# Patient Record
Sex: Male | Born: 1935
Health system: Southern US, Community
[De-identification: ages and names within clinical notes are randomized; demographics above are authoritative.]

## PROBLEM LIST (undated history)

## (undated) DIAGNOSIS — F32A Depression, unspecified: Secondary | ICD-10-CM

## (undated) DIAGNOSIS — I10 Essential (primary) hypertension: Secondary | ICD-10-CM

## (undated) DIAGNOSIS — K4090 Unilateral inguinal hernia, without obstruction or gangrene, not specified as recurrent: Secondary | ICD-10-CM

## (undated) DIAGNOSIS — C801 Malignant (primary) neoplasm, unspecified: Secondary | ICD-10-CM

## (undated) HISTORY — PX: EYE SURGERY: SHX253

## (undated) HISTORY — PX: NASAL SEPTUM SURGERY: SHX37

## (undated) HISTORY — PX: CATARACT EXTRACTION: SUR2

---

## 2016-01-20 ENCOUNTER — Emergency Department (HOSPITAL_COMMUNITY)
Admission: EM | Admit: 2016-01-20 | Discharge: 2016-01-20 | Disposition: A | Discharge status: Home / Self Care | Payer: Medicare Other | Attending: Emergency Medicine | Admitting: Emergency Medicine

## 2016-01-20 ENCOUNTER — Encounter (HOSPITAL_COMMUNITY): Payer: Self-pay

## 2016-01-20 DIAGNOSIS — L989 Disorder of the skin and subcutaneous tissue, unspecified: Secondary | ICD-10-CM

## 2016-01-20 DIAGNOSIS — I10 Essential (primary) hypertension: Secondary | ICD-10-CM | POA: Diagnosis not present

## 2016-01-20 DIAGNOSIS — K4090 Unilateral inguinal hernia, without obstruction or gangrene, not specified as recurrent: Secondary | ICD-10-CM | POA: Diagnosis not present

## 2016-01-20 MED ORDER — BACITRACIN ZINC 500 UNIT/GM EX OINT
TOPICAL_OINTMENT | Freq: Once | CUTANEOUS | Status: AC
  Administered 2016-01-20: 1 via TOPICAL

## 2016-01-20 NOTE — ED Triage Notes (Signed)
Pt states that he has has a hernia on his groin area since 2013 that he normally reduces himself, today he was unable to reduce it. Pt has never seen anyone for it.

## 2016-01-20 NOTE — ED Provider Notes (Signed)
TIME SEEN: 4:30 AM  CHIEF COMPLAINT: Evaluation of a right inguinal hernia  HPI: Pt is a 82 y.o. male with no significant past medical history who presents to the emergency department for evaluation of his right inguinal hernia. Reports his hernia has been present for the past 25 years. States that tonight he was unable to reduce his hernia and has had mild tenderness over this area. No skin changes. No fevers. No nausea or vomiting. Has been having normal bowel movements and passing gas. Denies history of abdominal surgery. No dysuria or hematuria. No testicular pain or swelling. No scrotal masses.   Patient is also hypertensive. He reports that he has whitecoat syndrome. He denies headache, vision changes, numbness or focal weakness, chest pain or shortness of breath. States his blood pressure is normally in the 130s/70s.   He is also complaining of a lesion to his upper back that has been present for 3 years. It bleeds intermittently. No drainage of anything that appears purulent. No fevers. It is not painful.   He states he has not followed up with a primary care physician in several years. His PCP is Dr. Arelia Sneddon. He has never seen a dermatologist for his back lesion is concerned that this could be cancer. Denies night sweats, weight loss. No history of skin cancer that he is aware of.  ROS: See HPI Constitutional: no fever  Eyes: no drainage  ENT: no runny nose   Cardiovascular:  no chest pain  Resp: no SOB  GI: no vomiting GU: no dysuria Integumentary: no rash  Allergy: no hives  Musculoskeletal: no leg swelling  Neurological: no slurred speech ROS otherwise negative  PAST MEDICAL HISTORY/PAST SURGICAL HISTORY:  History reviewed. No pertinent past medical history.  MEDICATIONS:  Prior to Admission medications   Not on File    ALLERGIES:  No Known Allergies  SOCIAL HISTORY:  Social History  Substance Use Topics  . Smoking status: Never Smoker  . Smokeless tobacco:  Never Used  . Alcohol use No    FAMILY HISTORY: No family history on file.  EXAM: BP 155/83 (BP Location: Right Arm)   Pulse (!) 58   Temp 98 F (36.7 C) (Oral)   Resp 17   Ht 5\' 10"  (1.778 m)   Wt 175 lb (79.4 kg)   SpO2 99%   BMI 25.11 kg/m  CONSTITUTIONAL: Alert and oriented and responds appropriately to questions. Well-appearing; well-nourished, Elderly, afebrile, nontoxic-appearing HEAD: Normocephalic EYES: Conjunctivae clear, PERRL, EOMI ENT: normal nose; no rhinorrhea; moist mucous membranes NECK: Supple, no meningismus, no nuchal rigidity, no LAD  CARD: RRR; S1 and S2 appreciated; no murmurs, no clicks, no rubs, no gallops RESP: Normal chest excursion without splinting or tachypnea; breath sounds clear and equal bilaterally; no wheezes, no rhonchi, no rales, no hypoxia or respiratory distress, speaking full sentences ABD/GI: Normal bowel sounds; non-distended; soft, non-tender, no rebound, no guarding, no peritoneal signs, no hepatosplenomegaly GU:  Normal external genitalia, circumcised male, normal penile shaft, no blood or discharge at the urethral meatus, no testicular masses or tenderness on exam, no scrotal masses or swelling, right inguinal hernia that is easily reducible on exam and is only minimally tender to palpation without erythema, warmth or ecchymosis of the scan, 2+ femoral pulses bilaterally; no perineal erythema, warmth, subcutaneous air or crepitus; no high riding testicle, normal bilateral cremasteric reflex.  Chaperone present for exam. BACK:  The back appears normal and is non-tender to palpation, there is no CVA tenderness EXT: Normal  ROM in all joints; non-tender to palpation; no edema; normal capillary refill; no cyanosis, no calf tenderness or swelling    SKIN: Normal color for age and race; warm; no rash; 7 x 10 cm area of skin breakdown to the mid thoracic region with some mild bleeding but no stranding erythema, warmth or purulent drainage. There is  no induration. NEURO: Moves all extremities equally, sensation to light touch intact diffusely, cranial nerves II through XII intact, normal speech PSYCH: The patient's mood and manner are appropriate. Grooming and personal hygiene are appropriate.  MEDICAL DECISION MAKING: Patient here with inguinal hernia that was easily reducible on exam. Otherwise abdominal exam benign and he has no fevers, nausea, vomiting, diarrhea. No sign of bowel obstruction. This is not an incarcerated hernia. We'll give him outpatient surgery follow-up as needed. He has had this hernia for 25 years. Have discussed with him how to reduce it at home.   Patient also hypertensive but is asymptomatic. He states he has whitecoat syndrome. Reports normally he has normal blood pressures. I have advised him to take his blood pressure at home and keep a log of it when he is feeling well and follow-up with his primary care physician closely to see if he should be started on antihypertensives. Discussed with him return precautions regarding this. He is comfortable with this plan.    Patient also with a large lesion to his back. No sign of superimposed infection. This could be a cancerous lesion area have recommended close outpatient dermatology follow-up for biopsy. Have given him a list of dermatologists in this area. He has no other systemic symptoms. We will clean, apply dressing to this area.   I have also discussed my recommendations with patient's son at bedside. They both verbalize understanding and are comfortable with this plan. Return precautions have been discussed as well.   At this time, I do not feel there is any life-threatening condition present. I have reviewed and discussed all results (EKG, imaging, lab, urine as appropriate) and exam findings with patient/family. I have reviewed nursing notes and appropriate previous records.  I feel the patient is safe to be discharged home without further emergent workup and can  continue workup as an outpatient as needed. Discussed usual and customary return precautions. Patient/family verbalize understanding and are comfortable with this plan.  Outpatient follow-up has been provided. All questions have been answered.        Keeler Farm, DO 01/20/16 [phone]

## 2016-01-20 NOTE — Discharge Instructions (Signed)
I recommend close follow-up with your primary care physician to monitor your blood pressure closely. If you develop severe headache, vision changes, chest pain or shortness of breath, numbness or weakness on one side of your body, please return to the hospital.   I also recommend close follow-up with a dermatologist for the lesion on your back. It does not appear infected today. This may be a cancerous lesion and you will need a biopsy as an outpatient. If it becomes red, hot, very tender to touch or you develop fever or drainage of pus from this area, you will need to be seen closely by your PCP or the emergency department.   You're also seen inferior inguinal hernia. If this begins to be an issue for you where it is constantly bothering you, inhibiting her life, you can follow-up with general surgery to discuss risk and benefits of surgical treatment.   To find a primary care or specialty doctor please call [phone] or [phone] to access "[place] a Doctor Service."  You may also go on the [facility] website at [email or web address]  There are also multiple [facility], [facility], [facility] and [facility] practices throughout the [place] that are frequently accepting new patients. You may find a clinic that is close to your home and contact them.  [place] [phone] [place] Department -  [place] [address] [phone]   [place] [facility] Dermatologists:   [facility]  3.2 [phone]  Dermatologist  [place] [address] [place]  [phone]   Dr. Michelene Gardener, MD  2.6 (18)  Dermatologist  [place]  [phone]  [facility]  3.5 (3)  [facility]  [place]  [phone]    [place]  4.0 (4)  Dermatologist  [place]  [phone]  Lavonna Monarch MD  3.0 (2)  Dermatologist  [place]  [phone]  Katrina Stack  2.7 (6)  Dermatologist  [place]  [phone]  [place] Amy Y MD  2.0 (1)  Dermatologist  [place]  [phone]  [place]  5.0 (3)  Doctor  [address]  [phone]

## 2017-04-22 NOTE — Progress Notes (Signed)
[facility] Note  04/25/2017     CHIEF COMPLAINT Patient presents for Retina Evaluation   HISTORY OF PRESENT ILLNESS: Donald Hawkins is a 84 y.o. male who presents to the clinic today for:   HPI    Retina Evaluation    In both eyes.  This started 5 months ago.  Associated Symptoms Distortion and Floaters.  Negative for Flashes, Pain, Trauma, Fever, Weight Loss, Scalp Tenderness, Redness, Photophobia, Jaw Claudication, Fatigue, Shoulder/Hip pain, Glare and Blind Spot.  Context:  distance vision, mid-range vision, near vision and reading.  Treatments tried include surgery.  Response to treatment was mild improvement.  I, the attending physician,  performed the HPI with the patient and updated documentation appropriately.          Comments    Referral of Dr. Katy Fitch for retina evaluation CME OS poss AMD. Patient states he had  cataract surgery left eye ( 12/11/16), his vision was good. After 2-3 weeks he felts as if, he had a filter covering his left eye, his vision changes from day to day. ( Skimmer covering his eye) Pt reports his vision has always been bad in right eye, he can only see forms of things .Pt reports he occasionally has wavy Va OS .Pt is using Lumigan, Cosopt, Ilevro gtts. Denies vits       Last edited by Bernarda Caffey, MD on 04/25/2017  2:07 PM. (History)    Pt states he was sent here by Dr. Elliot Dally; Pt states he sees Dr. Elliot Dally on a routine basis; Pt states he saw Dr. Elliot Dally a month ago due to "not being able to see out of left eye"; Pt states his VA was 20/30 then decreased to 20/40; Pt states OS VA fluctuates; Pt reports he was placed on Ilevro OS qd x 2 month; Pt reports he also uses lumigan OU qhs, and dorz-tim OU BID; Pt states he has "wavy" VA; Pt states OD VA is poor due to glaucoma, states in 2015 he was dx with glaucoma; Pt denies any family hx of glaucoma; Pt states he is unable to see any improvement since using Ilevro;   Referring  physician: Clent Jacks, MD [place] [address] [place], [place] [address]  HISTORICAL INFORMATION:   Selected notes from the MEDICAL RECORD NUMBER Referred by Dr. Elliot Dally for concern of possible exudative AMD OS;  LEE- 02.12.19 (R. Groat) [BCVA OD: 20/50 OS: 20/40+2] Ocular Hx- pseudophakia OU (OD 07.20.15 - R. Groat, OS 10.24.18 - R. Groat) , S/P Yag PI OU (03.2015 - R. Groat), S/P SLT OD (04.2016 - R. Groat), narrow angles OU, POAG OU, DES OU, CME OS, currently on lumigan OU qhs, dorzolamide-timolol OU BID, and Ilevero OS qd PMH- HTN, hx removal of skin ca    CURRENT MEDICATIONS: Current Outpatient Medications (Ophthalmic Drugs)  Medication Sig  . bimatoprost (LUMIGAN) 0.03 % ophthalmic solution 1 drop at bedtime.  . dorzolamide-timolol (COSOPT) 22.3-6.8 MG/ML ophthalmic solution Place 1 drop into both eyes 2 (two) times daily.  . nepafenac (ILEVRO) 0.3 % ophthalmic suspension Place 1 drop into the left eye 2 (two) times daily.  . prednisoLONE acetate (PRED FORTE) 1 % ophthalmic suspension Place 1 drop into the left eye 4 (four) times daily.   No current facility-administered medications for this visit.  (Ophthalmic Drugs)   No current outpatient medications on file. (Other)   No current facility-administered medications for this visit.  (Other)  REVIEW OF SYSTEMS: ROS    Positive for: Genitourinary, Eyes   Negative for: Constitutional, Gastrointestinal, Neurological, Skin, Musculoskeletal, HENT, Endocrine, Cardiovascular, Respiratory, Psychiatric, Allergic/Imm, Heme/Lymph   Last edited by Zenovia Jordan, LPN on 3/81/8299  3:71 PM. (History)       ALLERGIES No Known Allergies  PAST MEDICAL HISTORY History reviewed. No pertinent past medical history. Past Surgical History:  Procedure Laterality Date  . CATARACT EXTRACTION    . EYE SURGERY    . NASAL SEPTUM SURGERY    . NASAL SEPTUM SURGERY     40 yrs ago    FAMILY HISTORY Family History  Problem Relation Age of  Onset  . Emphysema Mother   . Cancer Father     SOCIAL HISTORY Social History   Tobacco Use  . Smoking status: Never Smoker  . Smokeless tobacco: Never Used  Substance Use Topics  . Alcohol use: Yes    Alcohol/week: 0.6 oz    Types: 1 Glasses of wine per week  . Drug use: No         OPHTHALMIC EXAM:  Base Eye Exam    Visual Acuity (Snellen - Linear)      Right Left   Dist [place] 20/70 -2 20/40 +1   Dist ph [place] 20/60 -1 20/30 -1       Tonometry (Tonopen, 1:45 PM)      Right Left   Pressure 11 11       Pupils      Dark Light Shape React APD   Right 2 1 Round Slow None   Left 2 1 Round Slow None       Visual Fields (Counting fingers)      Left Right    Full    Restrictions  Total superior temporal, inferior temporal, inferior nasal deficiencies; Partial outer superior nasal deficiency       Extraocular Movement      Right Left    Full, Ortho Full, Ortho       Neuro/Psych    Oriented x3:  Yes   Mood/Affect:  Normal       Dilation    Both eyes:  1.0% Mydriacyl, 2.5% Phenylephrine @ 1:52 PM        Slit Lamp and Fundus Exam    Slit Lamp Exam      Right Left   Lids/Lashes Meibomian gland dysfunction, Telangiectasia Meibomian gland dysfunction, Telangiectasia, Scurf   Conjunctiva/Sclera White and quiet White and quiet   Cornea Arcus Arcus   Anterior Chamber Deep and quiet Deep and 0.5+ cell/pigment   Iris Round and moderatly dilated to 47mm, temporal PI, Transillumination defects at 0800 Round and moderatly dilated to 40mm, Transillumination defects nasally and temporally   Lens PC IOL in good position, trace Posterior capsular opacification PC IOL in good position   Vitreous Vitreous syneresis, Posterior vitreous detachment Vitreous syneresis, Posterior vitreous detachment       Fundus Exam      Right Left   Disc 3-4+ Pallor, cupping Temporal Pallor   C/D Ratio 0.9 0.4   Macula Blunted foveal reflex, + central SRF, pigment mottling and clumping Blunted  foveal reflex, +SRF, +CME, no heme   Vessels Vascular attenuation, AV crossing changes Vascular attenuation, AV crossing changes   Periphery Attached Attached        Refraction    Manifest Refraction      Sphere Cylinder Axis Dist VA   Right -0.75 +0.50 020 NI   Left -0.50 +0.50 015  NI          IMAGING AND PROCEDURES  Imaging and Procedures for 04/25/17  OCT, Retina - OU - Both Eyes     Right Eye Quality was good. Central Foveal Thickness: 276. Progression has no prior data. Findings include abnormal foveal contour, subretinal fluid, retinal drusen , no IRF (Central vitelliform-like lesion).   Left Eye Quality was good. Central Foveal Thickness: 441. Progression has no prior data. Findings include abnormal foveal contour, intraretinal fluid, subretinal fluid, retinal drusen , outer retinal atrophy (Central vitelliform-like lesion with overlying CME).   Notes *Images captured and stored on drive  Diagnosis / Impression:  OD: small central vitelliform like lesion OS: small central vitelliform like lesion; +CME  Clinical management:  See below  Abbreviations: NFP - Normal foveal profile. CME - cystoid macular edema. PED - pigment epithelial detachment. IRF - intraretinal fluid. SRF - subretinal fluid. EZ - ellipsoid zone. ERM - epiretinal membrane. ORA - outer retinal atrophy. ORT - outer retinal tubulation. SRHM - subretinal hyper-reflective material         Fluorescein Angiography Optos (Transit OS)     Right Eye Progression has no prior data. Early phase findings include normal observations. Mid/Late phase findings include normal observations. Choroidal neovascularization is not present.   Left Eye Progression has no prior data. Early phase findings include normal observations. Mid/Late phase findings include leakage (petaloid hyper-fluoresceince about fovea, hyper fluorescence of disc).   Notes Impression:  OD: normal study OS: late petaloid hyper-fluoresceince  about fovea, hyper fluorescence of disc, consistent with CME                ASSESSMENT/PLAN:    ICD-10-CM   1. CME (cystoid macular edema), left H35.352 Fluorescein Angiography Optos (Transit OS)  2. Retinal edema H35.81 OCT, Retina - OU - Both Eyes    Fluorescein Angiography Optos (Transit OS)  3. Vitelliform lesion of macula H35.54   4. Posterior vitreous detachment of both eyes H43.813   5. Primary open angle glaucoma (POAG) of right eye, severe stage H40.1113   6. Primary open angle glaucoma (POAG) of left eye, moderate stage H40.1122   7. Pseudophakia of both eyes Z96.1     1,2. Cystoid macular edema, OS-  - OCT with +cystoid IRF OS - FA shows petaloid leakage about the fovea w/ hyperfluorescence of the disc consistent w/ post op CME - s/p phaco/PCIOL 12/2016 -- suspect rebound / postoperative CME OS - differential could include prostaglandin use (lumigan) - discussed findings, prognosis and treatment options - recommend increasing Ilevro to BID OS and starting PF QID OS - f/u in 2-3 wks for recheck  3. Vitelliform-like Dystrophy OU-  - mild central vitelliform like lesions OU - FA without obvious underlying CNVM - will monitor for now and reassess once #1 improved  4. PVD / vitreous syneresis OU-  Discussed findings and prognosis  No RT or RD on 360 peripheral exam  Reviewed s/s of RT/RD  Strict return precautions for any such RT/RD signs/symptoms  5, 6. Primary Open Angle Glaucoma OU-  - severe stage OD, moderate stage OS - s/p SLT and YAG PI OU - continue lumigan OU qd and dorzolamide-timolol OU BID - ?contribution of lumigan to CME -- will continue for now, but may be worth considering trial off lumigan - expertly managed by Dr. Elliot Dally - monitor  7. Pseudophakia OU  - s/p CE/IOL OU  - beautiful surgery, doing well  - monitor   Ophthalmic Meds  Ordered this visit:  Meds ordered this encounter  Medications  . prednisoLONE acetate (PRED FORTE) 1 %  ophthalmic suspension    Sig: Place 1 drop into the left eye 4 (four) times daily.    Dispense:  10 mL    Refill:  0  . nepafenac (ILEVRO) 0.3 % ophthalmic suspension    Sig: Place 1 drop into the left eye 2 (two) times daily.    Dispense:  3 mL    Refill:  2       Return for 2-3 wks, Dilated Exam, OCT.  There are no Patient Instructions on file for this visit.   Explained the diagnoses, plan, and follow up with the patient and they expressed understanding.  Patient expressed understanding of the importance of proper follow up care.   This document serves as a record of services personally performed by Gardiner Sleeper, MD, PhD. It was created on their behalf by Catha Brow, [place], a certified ophthalmic assistant. The creation of this record is the provider's dictation and/or activities during the visit.  Electronically signed by: Catha Brow, [place]  04/25/17 3:33 PM    Gardiner Sleeper, M.D., Ph.D. Diseases & Surgery of the Retina and Vitreous [facility] [place] 04/25/17   I have reviewed the above documentation for accuracy and completeness, and I agree with the above. Gardiner Sleeper, M.D., Ph.D. 04/25/17 3:33 PM    Abbreviations: M myopia (nearsighted); A astigmatism; H hyperopia (farsighted); P presbyopia; Mrx spectacle prescription;  CTL contact lenses; OD right eye; OS left eye; OU both eyes  XT exotropia; ET esotropia; PEK punctate epithelial keratitis; PEE punctate epithelial erosions; DES dry eye syndrome; MGD meibomian gland dysfunction; ATs artificial tears; PFAT's preservative free artificial tears; [place] nuclear sclerotic cataract; PSC posterior subcapsular cataract; ERM epi-retinal membrane; PVD posterior vitreous detachment; RD retinal detachment; DM diabetes mellitus; DR diabetic retinopathy; NPDR non-proliferative diabetic retinopathy; PDR proliferative diabetic retinopathy; CSME clinically significant macular edema; DME diabetic macular edema;  dbh dot blot hemorrhages; CWS cotton wool spot; POAG primary open angle glaucoma; C/D cup-to-disc ratio; HVF humphrey visual field; GVF goldmann visual field; OCT optical coherence tomography; IOP intraocular pressure; BRVO Branch retinal vein occlusion; CRVO central retinal vein occlusion; CRAO central retinal artery occlusion; BRAO branch retinal artery occlusion; RT retinal tear; SB scleral buckle; PPV pars plana vitrectomy; VH Vitreous hemorrhage; PRP panretinal laser photocoagulation; IVK intravitreal kenalog; VMT vitreomacular traction; MH Macular hole;  NVD neovascularization of the disc; NVE neovascularization elsewhere; AREDS age related eye disease study; ARMD age related macular degeneration; POAG primary open angle glaucoma; EBMD epithelial/anterior basement membrane dystrophy; ACIOL anterior chamber intraocular lens; IOL intraocular lens; PCIOL posterior chamber intraocular lens; Phaco/IOL phacoemulsification with intraocular lens placement; [place] photorefractive keratectomy; LASIK laser assisted in situ keratomileusis; HTN hypertension; DM diabetes mellitus; COPD chronic obstructive pulmonary disease

## 2017-04-25 ENCOUNTER — Ambulatory Visit (INDEPENDENT_AMBULATORY_CARE_PROVIDER_SITE_OTHER): Payer: Medicare Other | Admitting: Ophthalmology

## 2017-04-25 ENCOUNTER — Encounter (INDEPENDENT_AMBULATORY_CARE_PROVIDER_SITE_OTHER): Payer: Self-pay | Admitting: Ophthalmology

## 2017-04-25 DIAGNOSIS — H401113 Primary open-angle glaucoma, right eye, severe stage: Secondary | ICD-10-CM

## 2017-04-25 DIAGNOSIS — H401122 Primary open-angle glaucoma, left eye, moderate stage: Secondary | ICD-10-CM

## 2017-04-25 DIAGNOSIS — Z961 Presence of intraocular lens: Secondary | ICD-10-CM

## 2017-04-25 DIAGNOSIS — H43813 Vitreous degeneration, bilateral: Secondary | ICD-10-CM | POA: Diagnosis not present

## 2017-04-25 DIAGNOSIS — H35352 Cystoid macular degeneration, left eye: Secondary | ICD-10-CM | POA: Diagnosis not present

## 2017-04-25 DIAGNOSIS — H3554 Dystrophies primarily involving the retinal pigment epithelium: Secondary | ICD-10-CM | POA: Diagnosis not present

## 2017-04-25 DIAGNOSIS — H3581 Retinal edema: Secondary | ICD-10-CM | POA: Diagnosis not present

## 2017-04-25 MED ORDER — NEPAFENAC 0.3 % OP SUSP: 1.0000 [drp] | Freq: Two times a day (BID) | OPHTHALMIC | 2 refills | Status: DC

## 2017-04-25 MED ORDER — PREDNISOLONE ACETATE 1 % OP SUSP: 1.0000 [drp] | Freq: Four times a day (QID) | OPHTHALMIC | 0 refills | Status: DC

## 2017-05-11 NOTE — Progress Notes (Signed)
[facility] Note  05/12/2017     CHIEF COMPLAINT Patient presents for Retina Follow Up   HISTORY OF PRESENT ILLNESS: Donald Hawkins is a 84 y.o. male who presents to the clinic today for:   HPI    Retina Follow Up    Patient presents with  Other.  In left eye.  Severity is moderate.  Duration of 3 weeks.  Since onset it is stable.  I, the attending physician,  performed the HPI with the patient and updated documentation appropriately.          Comments    F/U CME OS; Pt states OU VA "comes and goes"; Pt states he is seeing "colors more vivid than before"; Pt states he is able to make out more detail than before; Pt states OU are comfortable; Pt reports he continues to have floaters, pt reports also seeing "a net but I don't notice as much as I did"; Pt reports he continues to have a wave in [place], states he notices the wavyness more so when watching TV; Pt denies ocular pain, denies flashes; Pt reports using Ilevero OS BID, PF OS QID, lumigan OU qd, and dorz-tim OU BID as directed;        Last edited by Bernarda Caffey, MD on 05/12/2017 10:31 AM. (History)       Referring physician: Leonard Downing, MD [place], [place] [address]  HISTORICAL INFORMATION:   Selected notes from the MEDICAL RECORD NUMBER Referred by Dr. Elliot Dally for concern of possible exudative AMD OS;  LEE- 02.12.19 (R. Groat) [BCVA OD: 20/50 OS: 20/40+2] Ocular Hx- pseudophakia OU (OD 07.20.15 - R. Groat, OS 10.24.18 - R. Groat) , S/P Yag PI OU (03.2015 - R. Groat), S/P SLT OD (04.2016 - R. Groat), narrow angles OU, POAG OU, DES OU, CME OS, currently on lumigan OU qhs, dorzolamide-timolol OU BID, and Ilevero OS qd PMH- HTN, hx removal of skin ca    CURRENT MEDICATIONS: Current Outpatient Medications (Ophthalmic Drugs)  Medication Sig  . bimatoprost (LUMIGAN) 0.03 % ophthalmic solution 1 drop at bedtime.  . brimonidine (ALPHAGAN) 0.15 % ophthalmic solution Place 1  drop into both eyes 2 (two) times daily.  . dorzolamide-timolol (COSOPT) 22.3-6.8 MG/ML ophthalmic solution Place 1 drop into both eyes 2 (two) times daily.  . nepafenac (ILEVRO) 0.3 % ophthalmic suspension Place 1 drop into the left eye 2 (two) times daily.  . prednisoLONE acetate (PRED FORTE) 1 % ophthalmic suspension Place 1 drop into the left eye 4 (four) times daily.   No current facility-administered medications for this visit.  (Ophthalmic Drugs)   No current outpatient medications on file. (Other)   No current facility-administered medications for this visit.  (Other)      REVIEW OF SYSTEMS: ROS    Positive for: Eyes   Negative for: Constitutional, Gastrointestinal, Neurological, Skin, Genitourinary, Musculoskeletal, HENT, Endocrine, Cardiovascular, Respiratory, Psychiatric, Allergic/Imm, Heme/Lymph   Last edited by Cherrie Gauze on 05/12/2017  9:47 AM. (History)       ALLERGIES No Known Allergies  PAST MEDICAL HISTORY History reviewed. No pertinent past medical history. Past Surgical History:  Procedure Laterality Date  . CATARACT EXTRACTION    . EYE SURGERY    . NASAL SEPTUM SURGERY    . NASAL SEPTUM SURGERY     40 yrs ago    FAMILY HISTORY Family History  Problem Relation Age of Onset  . Emphysema Mother   . Cancer  Father     SOCIAL HISTORY Social History   Tobacco Use  . Smoking status: Never Smoker  . Smokeless tobacco: Never Used  Substance Use Topics  . Alcohol use: Yes    Alcohol/week: 0.6 oz    Types: 1 Glasses of wine per week  . Drug use: No         OPHTHALMIC EXAM:  Base Eye Exam    Visual Acuity (Snellen - Linear)      Right Left   Dist [place] 20/60 -2 20/40   Dist ph [place] 20/50 -2 20/30       Tonometry (Tonopen, 9:56 AM)      Right Left   Pressure 12 16       Pupils      Dark Light Shape React APD   Right 3 3 Round Sluggish Trace   Left 3 2 Round Slow None       Visual Fields (Counting fingers)      Left Right     Full    Restrictions  Total superior temporal, inferior temporal, inferior nasal deficiencies; Partial outer superior nasal deficiency       Extraocular Movement      Right Left    Full, Ortho Full, Ortho       Neuro/Psych    Oriented x3:  Yes   Mood/Affect:  Normal       Dilation    Both eyes:  1.0% Mydriacyl, 2.5% Phenylephrine @ 9:56 AM        Slit Lamp and Fundus Exam    Slit Lamp Exam      Right Left   Lids/Lashes Meibomian gland dysfunction, Telangiectasia Meibomian gland dysfunction, Telangiectasia, Scurf   Conjunctiva/Sclera White and quiet White and quiet   Cornea Arcus Arcus   Anterior Chamber Deep and quiet Deep and 0.5+ cell/pigment   Iris Round and moderatly dilated to 60mm, temporal PI, Transillumination defects at 0800 Round and moderatly dilated to 36mm, Transillumination defects nasally and temporally   Lens PC IOL in good position, trace Posterior capsular opacification PC IOL in good position   Vitreous Vitreous syneresis, Posterior vitreous detachment Vitreous syneresis, Posterior vitreous detachment       Fundus Exam      Right Left   Disc 3-4+ Pallor, cupping Temporal Pallor   C/D Ratio 0.9 0.4   Macula Blunted foveal reflex, +central SRF/vitelliform lesion, pigment mottling and clumping Blunted foveal reflex, +SRF/vitelliform lesion, +CME / cystic changes, no heme   Vessels Vascular attenuation, AV crossing changes Vascular attenuation, AV crossing changes   Periphery Attached Attached          IMAGING AND PROCEDURES  Imaging and Procedures for 05/12/17  OCT, Retina - OU - Both Eyes     Right Eye Quality was good. Central Foveal Thickness: 273. Progression has been stable. Findings include abnormal foveal contour, subretinal fluid, retinal drusen , no IRF (Central vitelliform-like lesion).   Left Eye Quality was good. Central Foveal Thickness: 434. Progression has improved. Findings include abnormal foveal contour, intraretinal fluid,  subretinal fluid, retinal drusen , outer retinal atrophy (Central vitelliform-like lesion with overlying CME).   Notes *Images captured and stored on drive  Diagnosis / Impression:  OD: small central vitelliform like lesion OS: small central vitelliform like lesion; +CME - slightly improved  Clinical management:  See below  Abbreviations: NFP - Normal foveal profile. CME - cystoid macular edema. PED - pigment epithelial detachment. IRF - intraretinal fluid. SRF - subretinal fluid. EZ -  ellipsoid zone. ERM - epiretinal membrane. ORA - outer retinal atrophy. ORT - outer retinal tubulation. SRHM - subretinal hyper-reflective material                  ASSESSMENT/PLAN:    ICD-10-CM   1. CME (cystoid macular edema), left H35.352   2. Retinal edema H35.81 OCT, Retina - OU - Both Eyes  3. Vitelliform lesion of macula H35.54   4. Posterior vitreous detachment of both eyes H43.813   5. Primary open angle glaucoma (POAG) of right eye, severe stage H40.1113   6. Primary open angle glaucoma (POAG) of left eye, moderate stage H40.1122   7. Pseudophakia of both eyes Z96.1     1,2. Cystoid macular edema, OS-  - OCT with +cystoid IRF OS - FA at presentation (2.18.19) showed petaloid leakage about the fovea w/ hyperfluorescence of the disc consistent w/ post op CME - s/p phaco/PCIOL 12/2016 -- suspect rebound / postoperative CME OS - differential could include prostaglandin use (lumigan) - mild interval improvement on topical Ilevro and PF - discussed findings, prognosis and treatment options - continue Ilevro to BID OS and PF QID OS - recommend replacement of lumigan with brimonidine qhs for now - f/u in 2-3 weeks  3. Vitelliform-like Dystrophy OU-  - mild central vitelliform like lesions OU - FA without obvious underlying CNVM - will monitor for now and reassess once #1 improved  4. PVD / vitreous syneresis OU-  Discussed findings and prognosis  No RT or RD on 360 peripheral  exam  Reviewed s/s of RT/RD  Strict return precautions for any such RT/RD signs/symptoms  5, 6. Primary Open Angle Glaucoma OU-  - severe stage OD, moderate stage OS - expertly managed by Dr. Elliot Dally - s/p SLT and YAG PI OU - continue dorzolamide-timolol OU BID - recommend D/C lumigan and switch to brimonidine OU qhs due to association of prostaglandin analogs to CME - will recheck IOP at next follow up in 3 wks - will send letter to Dr. Katy Fitch to update - monitor  7. Pseudophakia OU  - s/p CE/IOL OU  - beautiful surgery, doing well  - monitor   Ophthalmic Meds Ordered this visit:  Meds ordered this encounter  Medications  . brimonidine (ALPHAGAN) 0.15 % ophthalmic solution    Sig: Place 1 drop into both eyes 2 (two) times daily.    Dispense:  10 mL    Refill:  1       Return in about 3 weeks (around 06/02/2017) for F/U CME OS.  There are no Patient Instructions on file for this visit.   Explained the diagnoses, plan, and follow up with the patient and they expressed understanding.  Patient expressed understanding of the importance of proper follow up care.   This document serves as a record of services personally performed by Gardiner Sleeper, MD, PhD. It was created on their behalf by Catha Brow, [place], a certified ophthalmic assistant. The creation of this record is the provider's dictation and/or activities during the visit.  Electronically signed by: Catha Brow, [place]  05/12/17 11:17 PM   Gardiner Sleeper, M.D., Ph.D. Diseases & Surgery of the Retina and [place] 05/12/17   I have reviewed the above documentation for accuracy and completeness, and I agree with the above. Gardiner Sleeper, M.D., Ph.D. 05/12/17 11:22 PM     Abbreviations: M myopia (nearsighted); A astigmatism; H hyperopia (farsighted); P presbyopia; Mrx spectacle prescription;  CTL contact lenses; OD right eye; OS left eye; OU both eyes  XT exotropia; ET  esotropia; PEK punctate epithelial keratitis; PEE punctate epithelial erosions; DES dry eye syndrome; MGD meibomian gland dysfunction; ATs artificial tears; PFAT's preservative free artificial tears; [place] nuclear sclerotic cataract; PSC posterior subcapsular cataract; ERM epi-retinal membrane; PVD posterior vitreous detachment; RD retinal detachment; DM diabetes mellitus; DR diabetic retinopathy; NPDR non-proliferative diabetic retinopathy; PDR proliferative diabetic retinopathy; CSME clinically significant macular edema; DME diabetic macular edema; dbh dot blot hemorrhages; CWS cotton wool spot; POAG primary open angle glaucoma; C/D cup-to-disc ratio; HVF humphrey visual field; GVF goldmann visual field; OCT optical coherence tomography; IOP intraocular pressure; BRVO Branch retinal vein occlusion; CRVO central retinal vein occlusion; CRAO central retinal artery occlusion; BRAO branch retinal artery occlusion; RT retinal tear; SB scleral buckle; PPV pars plana vitrectomy; VH Vitreous hemorrhage; PRP panretinal laser photocoagulation; IVK intravitreal kenalog; VMT vitreomacular traction; MH Macular hole;  NVD neovascularization of the disc; NVE neovascularization elsewhere; AREDS age related eye disease study; ARMD age related macular degeneration; POAG primary open angle glaucoma; EBMD epithelial/anterior basement membrane dystrophy; ACIOL anterior chamber intraocular lens; IOL intraocular lens; PCIOL posterior chamber intraocular lens; Phaco/IOL phacoemulsification with intraocular lens placement; [place] photorefractive keratectomy; LASIK laser assisted in situ keratomileusis; HTN hypertension; DM diabetes mellitus; COPD chronic obstructive pulmonary disease

## 2017-05-12 ENCOUNTER — Ambulatory Visit (INDEPENDENT_AMBULATORY_CARE_PROVIDER_SITE_OTHER): Payer: Medicare Other | Admitting: Ophthalmology

## 2017-05-12 ENCOUNTER — Encounter (INDEPENDENT_AMBULATORY_CARE_PROVIDER_SITE_OTHER): Payer: Self-pay | Admitting: Ophthalmology

## 2017-05-12 DIAGNOSIS — H35352 Cystoid macular degeneration, left eye: Secondary | ICD-10-CM

## 2017-05-12 DIAGNOSIS — H401113 Primary open-angle glaucoma, right eye, severe stage: Secondary | ICD-10-CM

## 2017-05-12 DIAGNOSIS — Z961 Presence of intraocular lens: Secondary | ICD-10-CM

## 2017-05-12 DIAGNOSIS — H3554 Dystrophies primarily involving the retinal pigment epithelium: Secondary | ICD-10-CM | POA: Diagnosis not present

## 2017-05-12 DIAGNOSIS — H3581 Retinal edema: Secondary | ICD-10-CM | POA: Diagnosis not present

## 2017-05-12 DIAGNOSIS — H43813 Vitreous degeneration, bilateral: Secondary | ICD-10-CM | POA: Diagnosis not present

## 2017-05-12 DIAGNOSIS — H401122 Primary open-angle glaucoma, left eye, moderate stage: Secondary | ICD-10-CM

## 2017-05-12 MED ORDER — BRIMONIDINE TARTRATE 0.15 % OP SOLN: 1.0000 [drp] | Freq: Two times a day (BID) | OPHTHALMIC | 1 refills | Status: DC

## 2017-05-25 NOTE — Progress Notes (Signed)
[facility] Note  05/26/2017     CHIEF COMPLAINT Patient presents for Retina Follow Up   HISTORY OF PRESENT ILLNESS: Donald Hawkins is a 84 y.o. male who presents to the clinic today for:   HPI    Retina Follow Up    Patient presents with  Other.  In left eye.  Severity is moderate.  Duration of 3 weeks.  Since onset it is stable.  I, the attending physician,  performed the HPI with the patient and updated documentation appropriately.          Comments    Pt presents today for F/U for CME OS, pt states VA is fluctuating, he had 3 really good days, but then it seems to go back to the way it was, pt states VA was not very good yesterday and is not very good today, pt states distortion with lines OS is better, pt states colors look brighter since he was here last, pt denies flashes, floaters, wavy vision or pain, but has occasionally discomfort, pt states he uses artificial tears PRN as well as Dorzol/Timol, Brimonidine, Ilevro and Pred Forte,        Last edited by Bernarda Caffey, MD on 05/26/2017  9:39 AM. (History)    Pt states OS VA "fluctuates"; Pt states he feels OS VA was "better" this week but "not this morning";    Referring physician: Leonard Downing, MD [place], [place] [address]  HISTORICAL INFORMATION:   Selected notes from the MEDICAL RECORD NUMBER Referred by Dr. Elliot Dally for concern of possible exudative AMD OS;  LEE- 02.12.19 (R. Groat) [BCVA OD: 20/50 OS: 20/40+2] Ocular Hx- pseudophakia OU (OD 07.20.15 - R. Groat, OS 10.24.18 - R. Groat) , S/P Yag PI OU (03.2015 - R. Groat), S/P SLT OD (04.2016 - R. Groat), narrow angles OU, POAG OU, DES OU, CME OS, currently on lumigan OU qhs, dorzolamide-timolol OU BID, and Ilevero OS qd PMH- HTN, hx removal of skin ca    CURRENT MEDICATIONS: Current Outpatient Medications (Ophthalmic Drugs)  Medication Sig  . brimonidine (ALPHAGAN) 0.15 % ophthalmic solution Place 1 drop into  both eyes 2 (two) times daily.  . dorzolamide-timolol (COSOPT) 22.3-6.8 MG/ML ophthalmic solution Place 1 drop into both eyes 2 (two) times daily.  . nepafenac (ILEVRO) 0.3 % ophthalmic suspension Place 1 drop into the left eye 2 (two) times daily.  . prednisoLONE acetate (PRED FORTE) 1 % ophthalmic suspension Place 1 drop into the left eye 4 (four) times daily.  . bimatoprost (LUMIGAN) 0.03 % ophthalmic solution 1 drop at bedtime.   No current facility-administered medications for this visit.  (Ophthalmic Drugs)   No current outpatient medications on file. (Other)   No current facility-administered medications for this visit.  (Other)      REVIEW OF SYSTEMS: ROS    Positive for: Eyes   Negative for: Constitutional, Gastrointestinal, Neurological, Skin, Genitourinary, Musculoskeletal, HENT, Endocrine, Cardiovascular, Respiratory, Psychiatric, Allergic/Imm, Heme/Lymph   Last edited by Debbrah Alar, COT on 05/26/2017  9:06 AM. (History)       ALLERGIES No Known Allergies  PAST MEDICAL HISTORY History reviewed. No pertinent past medical history. Past Surgical History:  Procedure Laterality Date  . CATARACT EXTRACTION    . EYE SURGERY    . NASAL SEPTUM SURGERY    . NASAL SEPTUM SURGERY     40 yrs ago    FAMILY HISTORY Family History  Problem Relation Age of  Onset  . Emphysema Mother   . Cancer Father     SOCIAL HISTORY Social History   Tobacco Use  . Smoking status: Never Smoker  . Smokeless tobacco: Never Used  Substance Use Topics  . Alcohol use: Yes    Alcohol/week: 0.6 oz    Types: 1 Glasses of wine per week  . Drug use: No         OPHTHALMIC EXAM:  Base Eye Exam    Visual Acuity (Snellen - Linear)      Right Left   Dist [place] 20/80 -1 20/40 -2   Dist ph [place] 20/50 -2 20/40       Tonometry (Tonopen, 9:13 AM)      Right Left   Pressure 10 12       Pupils      Dark Light Shape React APD   Right 2 1.5 Round Slow None   Left 2 1.5 Round Slow None        Visual Fields      Left Right    Full    Restrictions  Partial outer superior temporal, inferior temporal, superior nasal, inferior nasal deficiencies       Extraocular Movement      Right Left    Full, Ortho Full, Ortho       Neuro/Psych    Oriented x3:  Yes   Mood/Affect:  Normal       Dilation    Both eyes:  2.5% Phenylephrine, 1.0% Mydriacyl @ 9:13 AM        Slit Lamp and Fundus Exam    Slit Lamp Exam      Right Left   Lids/Lashes Meibomian gland dysfunction, Telangiectasia Meibomian gland dysfunction, Telangiectasia, Scurf   Conjunctiva/Sclera White and quiet White and quiet   Cornea Arcus Arcus   Anterior Chamber Deep and quiet Deep and 0.5+ cell/pigment   Iris Round and moderatly dilated to 84mm, temporal PI, Transillumination defects at 0800 Round and moderatly dilated to 98mm, Transillumination defects nasally and temporally   Lens PC IOL in good position, trace Posterior capsular opacification PC IOL in good position   Vitreous Vitreous syneresis, Posterior vitreous detachment Vitreous syneresis, Posterior vitreous detachment       Fundus Exam      Right Left   Disc 3-4+ Pallor, cupping Temporal Pallor   C/D Ratio 0.9 0.4   Macula Blunted foveal reflex, +central SRF/vitelliform lesion, pigment mottling and clumping Blunted foveal reflex, +SRF/vitelliform lesion, +CME / cystic changes - slightly improved, no heme   Vessels Vascular attenuation, AV crossing changes Vascular attenuation, AV crossing changes   Periphery Attached Attached          IMAGING AND PROCEDURES  Imaging and Procedures for 05/29/17  OCT, Retina - OU - Both Eyes       Right Eye Quality was good. Central Foveal Thickness: 272. Progression has been stable. Findings include abnormal foveal contour, subretinal fluid, retinal drusen , no IRF (Central vitelliform-like lesion).   Left Eye Quality was good. Central Foveal Thickness: 368. Progression has improved. Findings include  abnormal foveal contour, intraretinal fluid, subretinal fluid, retinal drusen , outer retinal atrophy (Central vitelliform-like lesion with overlying CME - interval improvement).   Notes *Images captured and stored on drive  Diagnosis / Impression:  OD: small central vitelliform like lesion OS: small central vitelliform like lesion; interval improvement in overlying CME  Clinical management:  See below  Abbreviations: NFP - Normal foveal profile. CME - cystoid macular  edema. PED - pigment epithelial detachment. IRF - intraretinal fluid. SRF - subretinal fluid. EZ - ellipsoid zone. ERM - epiretinal membrane. ORA - outer retinal atrophy. ORT - outer retinal tubulation. SRHM - subretinal hyper-reflective material                  ASSESSMENT/PLAN:    ICD-10-CM   1. CME (cystoid macular edema), left H35.352   2. Retinal edema H35.81 OCT, Retina - OU - Both Eyes  3. Vitelliform lesion of macula H35.54   4. Posterior vitreous detachment of both eyes H43.813   5. Primary open angle glaucoma (POAG) of right eye, severe stage H40.1113   6. Primary open angle glaucoma (POAG) of left eye, moderate stage H40.1122   7. Pseudophakia of both eyes Z96.1     1,2. Cystoid macular edema, OS-  - OCT with +cystoid IRF OS - FA at presentation (2.18.19) showed petaloid leakage about the fovea w/ hyperfluorescence of the disc consistent w/ post op CME - s/p phaco/PCIOL 12/2016 -- suspect rebound / postoperative CME OS - differential could include prostaglandin use (lumigan) - today, mild interval improvement on topical Ilevro and PF - discussed findings, prognosis and treatment options - continue Ilevro BID OS and PF QID OS - continue holding lumigan and taking continue brimonidine qhs for now -- IOP has remained good and now adverse effects of brim - f/u in 3-4 weeks  3. Vitelliform-like Dystrophy OU-  - mild central vitelliform like lesions OU - FA without obvious underlying CNVM - will  monitor for now and reassess once #1 improved  4. PVD / vitreous syneresis OU-  Discussed findings and prognosis  No RT or RD on 360 peripheral exam  Reviewed s/s of RT/RD  Strict return precautions for any such RT/RD signs/symptoms  5, 6. Primary Open Angle Glaucoma OU-  - severe stage OD, moderate stage OS - expertly managed by Dr. Elliot Dally - s/p SLT and YAG PI OU - continue dorzolamide-timolol OU BID - switched lumigan to brimonidine OU qhs due to association of prostaglandin analogs to CME - will recheck IOP at next follow up  - monitor  7. Pseudophakia OU  - s/p CE/IOL OU  - beautiful surgery, doing well  - monitor   Ophthalmic Meds Ordered this visit:  No orders of the defined types were placed in this encounter.      Return in about 1 month (around 06/23/2017) for F/U CME OS.  There are no Patient Instructions on file for this visit.   Explained the diagnoses, plan, and follow up with the patient and they expressed understanding.  Patient expressed understanding of the importance of proper follow up care.   This document serves as a record of services personally performed by Gardiner Sleeper, MD, PhD. It was created on their behalf by Catha Brow, [place], a certified ophthalmic assistant. The creation of this record is the provider's dictation and/or activities during the visit.  Electronically signed by: Catha Brow, [place]  05/29/17 12:31 AM   Gardiner Sleeper, M.D., Ph.D. Diseases & Surgery of the Retina and [place] 05/29/17  I have reviewed the above documentation for accuracy and completeness, and I agree with the above. Gardiner Sleeper, M.D., Ph.D. 05/29/17 12:34 AM     Abbreviations: M myopia (nearsighted); A astigmatism; H hyperopia (farsighted); P presbyopia; Mrx spectacle prescription;  CTL contact lenses; OD right eye; OS left eye; OU both eyes  XT exotropia; ET esotropia;  PEK punctate epithelial keratitis; PEE  punctate epithelial erosions; DES dry eye syndrome; MGD meibomian gland dysfunction; ATs artificial tears; PFAT's preservative free artificial tears; [place] nuclear sclerotic cataract; PSC posterior subcapsular cataract; ERM epi-retinal membrane; PVD posterior vitreous detachment; RD retinal detachment; DM diabetes mellitus; DR diabetic retinopathy; NPDR non-proliferative diabetic retinopathy; PDR proliferative diabetic retinopathy; CSME clinically significant macular edema; DME diabetic macular edema; dbh dot blot hemorrhages; CWS cotton wool spot; POAG primary open angle glaucoma; C/D cup-to-disc ratio; HVF humphrey visual field; GVF goldmann visual field; OCT optical coherence tomography; IOP intraocular pressure; BRVO Branch retinal vein occlusion; CRVO central retinal vein occlusion; CRAO central retinal artery occlusion; BRAO branch retinal artery occlusion; RT retinal tear; SB scleral buckle; PPV pars plana vitrectomy; VH Vitreous hemorrhage; PRP panretinal laser photocoagulation; IVK intravitreal kenalog; VMT vitreomacular traction; MH Macular hole;  NVD neovascularization of the disc; NVE neovascularization elsewhere; AREDS age related eye disease study; ARMD age related macular degeneration; POAG primary open angle glaucoma; EBMD epithelial/anterior basement membrane dystrophy; ACIOL anterior chamber intraocular lens; IOL intraocular lens; PCIOL posterior chamber intraocular lens; Phaco/IOL phacoemulsification with intraocular lens placement; [place] photorefractive keratectomy; LASIK laser assisted in situ keratomileusis; HTN hypertension; DM diabetes mellitus; COPD chronic obstructive pulmonary disease

## 2017-05-26 ENCOUNTER — Ambulatory Visit (INDEPENDENT_AMBULATORY_CARE_PROVIDER_SITE_OTHER): Payer: Medicare Other | Admitting: Ophthalmology

## 2017-05-26 ENCOUNTER — Encounter (INDEPENDENT_AMBULATORY_CARE_PROVIDER_SITE_OTHER): Payer: Self-pay | Admitting: Ophthalmology

## 2017-05-26 DIAGNOSIS — Z961 Presence of intraocular lens: Secondary | ICD-10-CM

## 2017-05-26 DIAGNOSIS — H43813 Vitreous degeneration, bilateral: Secondary | ICD-10-CM | POA: Diagnosis not present

## 2017-05-26 DIAGNOSIS — H35352 Cystoid macular degeneration, left eye: Secondary | ICD-10-CM | POA: Diagnosis not present

## 2017-05-26 DIAGNOSIS — H401113 Primary open-angle glaucoma, right eye, severe stage: Secondary | ICD-10-CM

## 2017-05-26 DIAGNOSIS — H3581 Retinal edema: Secondary | ICD-10-CM

## 2017-05-26 DIAGNOSIS — H3554 Dystrophies primarily involving the retinal pigment epithelium: Secondary | ICD-10-CM

## 2017-05-26 DIAGNOSIS — H401122 Primary open-angle glaucoma, left eye, moderate stage: Secondary | ICD-10-CM

## 2017-05-29 ENCOUNTER — Encounter (INDEPENDENT_AMBULATORY_CARE_PROVIDER_SITE_OTHER): Payer: Self-pay | Admitting: Ophthalmology

## 2017-06-21 NOTE — Progress Notes (Signed)
[facility] Note  06/23/2017     CHIEF COMPLAINT Patient presents for Retina Follow Up   HISTORY OF PRESENT ILLNESS: Donald Hawkins is a 84 y.o. male who presents to the clinic today for:   HPI    Retina Follow Up    In left eye.  This started 3 weeks ago.  Severity is mild.  Since onset it is gradually improving.  I, the attending physician,  performed the HPI with the patient and updated documentation appropriately.          Comments    F/U CME OS. Patient states his vision has greatly improved, occasional floaters os. Denies flashes and ocular pain. He is using gtt's as directed.Denies vit's       Last edited by Bernarda Caffey, MD on 06/23/2017 12:31 PM. (History)      Referring physician: Clent Jacks, MD [address] [place], [place] [address]  HISTORICAL INFORMATION:   Selected notes from the MEDICAL RECORD NUMBER Referred by Dr. Elliot Dally for concern of possible exudative AMD OS;  LEE- 02.12.19 (R. Groat) [BCVA OD: 20/50 OS: 20/40+2] Ocular Hx- pseudophakia OU (OD 07.20.15 - R. Groat, OS 10.24.18 - R. Groat) , S/P Yag PI OU (03.2015 - R. Groat), S/P SLT OD (04.2016 - R. Groat), narrow angles OU, POAG OU, DES OU, CME OS, currently on lumigan OU qhs, dorzolamide-timolol OU BID, and Ilevero OS qd PMH- HTN, hx removal of skin ca    CURRENT MEDICATIONS: Current Outpatient Medications (Ophthalmic Drugs)  Medication Sig  . bimatoprost (LUMIGAN) 0.03 % ophthalmic solution 1 drop at bedtime.  . brimonidine (ALPHAGAN) 0.15 % ophthalmic solution Place 1 drop into both eyes 2 (two) times daily.  . dorzolamide-timolol (COSOPT) 22.3-6.8 MG/ML ophthalmic solution Place 1 drop into both eyes 2 (two) times daily.  . nepafenac (ILEVRO) 0.3 % ophthalmic suspension Place 1 drop into the left eye 2 (two) times daily.  . prednisoLONE acetate (PRED FORTE) 1 % ophthalmic suspension Place 1 drop into the left eye 4 (four) times daily.   No current  facility-administered medications for this visit.  (Ophthalmic Drugs)   No current outpatient medications on file. (Other)   No current facility-administered medications for this visit.  (Other)      REVIEW OF SYSTEMS: ROS    Positive for: Eyes   Negative for: Constitutional, Gastrointestinal, Neurological, Skin, Genitourinary, Musculoskeletal, HENT, Endocrine, Cardiovascular, Respiratory, Psychiatric, Allergic/Imm, Heme/Lymph   Last edited by Zenovia Jordan, LPN on 0/86/7619  5:09 AM. (History)       ALLERGIES No Known Allergies  PAST MEDICAL HISTORY History reviewed. No pertinent past medical history. Past Surgical History:  Procedure Laterality Date  . CATARACT EXTRACTION    . EYE SURGERY    . NASAL SEPTUM SURGERY    . NASAL SEPTUM SURGERY     40 yrs ago    FAMILY HISTORY Family History  Problem Relation Age of Onset  . Emphysema Mother   . Cancer Father     SOCIAL HISTORY Social History   Tobacco Use  . Smoking status: Never Smoker  . Smokeless tobacco: Never Used  Substance Use Topics  . Alcohol use: Yes    Alcohol/week: 0.6 oz    Types: 1 Glasses of wine per week  . Drug use: No         OPHTHALMIC EXAM:  Base Eye Exam    Visual Acuity (Snellen - Linear)      Right  Left   Dist [place] 20/80 -2 20/40 +2   Dist ph [place] 20/60 20/25 -2       Tonometry (Tonopen, 9:09 AM)      Right Left   Pressure 12 12       Pupils      Dark Light Shape React APD   Right 2 1.5 Round Slow None   Left 2 1.5 Round Slow None       Visual Fields      Left Right    Full    Restrictions  Partial outer superior temporal, inferior temporal, superior nasal, inferior nasal deficiencies       Extraocular Movement      Right Left    Full, Ortho Full, Ortho       Neuro/Psych    Oriented x3:  Yes   Mood/Affect:  Normal       Dilation    Both eyes:  1.0% Mydriacyl, 2.5% Phenylephrine @ 9:09 AM        Slit Lamp and Fundus Exam    Slit Lamp Exam      Right  Left   Lids/Lashes Meibomian gland dysfunction, Telangiectasia Meibomian gland dysfunction, Telangiectasia, Scurf   Conjunctiva/Sclera White and quiet White and quiet   Cornea Arcus Arcus   Anterior Chamber Deep and quiet Deep and 0.5+ cell/pigment   Iris Round and moderatly dilated to 40mm, temporal PI, Transillumination defects at 0800 Round and moderatly dilated to 6mm, Transillumination defects nasally and temporally   Lens PC IOL in good position, trace Posterior capsular opacification PC IOL in good position   Vitreous Vitreous syneresis, Posterior vitreous detachment Vitreous syneresis, Posterior vitreous detachment       Fundus Exam      Right Left   Disc 3-4+ Pallor, cupping Temporal Pallor   C/D Ratio 0.9 0.4   Macula Blunted foveal reflex, +central SRF/vitelliform lesion, pigment mottling and clumping Blunted foveal reflex, +SRF/vitelliform lesion, +CME / cystic changes - interval  Improvement, almost resolved; no heme   Vessels Vascular attenuation, AV crossing changes Vascular attenuation, AV crossing changes   Periphery Attached Attached          IMAGING AND PROCEDURES  Imaging and Procedures for 06/23/17  OCT, Retina - OU - Both Eyes       Right Eye Quality was good. Central Foveal Thickness: 276. Progression has been stable. Findings include abnormal foveal contour, subretinal fluid, retinal drusen , no IRF (Central vitelliform-like lesion - slightly broader and flatter in comparison to prior).   Left Eye Quality was good. Central Foveal Thickness: 304. Progression has improved. Findings include abnormal foveal contour, intraretinal fluid, subretinal fluid, retinal drusen , outer retinal atrophy (Central vitelliform-like lesion with overlying CME - further improvement from prior).   Notes *Images captured and stored on drive  Diagnosis / Impression:  OD: small central vitelliform like lesion OS: small central vitelliform like lesion; further improvement in  overlying CME from prior  Clinical management:  See below  Abbreviations: NFP - Normal foveal profile. CME - cystoid macular edema. PED - pigment epithelial detachment. IRF - intraretinal fluid. SRF - subretinal fluid. EZ - ellipsoid zone. ERM - epiretinal membrane. ORA - outer retinal atrophy. ORT - outer retinal tubulation. SRHM - subretinal hyper-reflective material                  ASSESSMENT/PLAN:    ICD-10-CM   1. CME (cystoid macular edema), left H35.352 OCT, Retina - OU - Both Eyes  2. Retinal edema H35.81   3. Vitelliform lesion of macula H35.54   4. Posterior vitreous detachment of both eyes H43.813   5. Primary open angle glaucoma (POAG) of right eye, severe stage H40.1113   6. Primary open angle glaucoma (POAG) of left eye, moderate stage H40.1122   7. Pseudophakia of both eyes Z96.1     1,2. Cystoid macular edema, OS-  - OCT with +cystoid IRF OS - FA at presentation (2.18.19) showed petaloid leakage about the fovea w/ hyperfluorescence of the disc consistent w/ post op CME - s/p phaco/PCIOL 12/2016 -- suspect rebound / postoperative CME OS - differential could include prostaglandin use (lumigan) - today, further interval improvement on topical Ilevro and PF - discussed findings, prognosis and treatment options - continue Ilevro BID OS and PF QID OS - continue holding lumigan and continue brimonidine qhs for now -- IOP has remained good and no adverse effects of brim - f/u in 3-4 weeks  3. Vitelliform-like Dystrophy OU-  - mild central vitelliform like lesions OU - FA without obvious underlying CNVM - will monitor for now and reassess once #1 improved  4. PVD / vitreous syneresis OU-  Discussed findings and prognosis  No RT or RD on 360 peripheral exam  Reviewed s/s of RT/RD  Strict return precautions for any such RT/RD signs/symptoms  5, 6. Primary Open Angle Glaucoma OU-  - severe stage OD, moderate stage OS - expertly managed by Dr. Elliot Dally - s/p  SLT and YAG PI OU - continue dorzolamide-timolol OU BID - switched lumigan to brimonidine OU qhs due to association of prostaglandin analogs to CME - IOP good today  - continue to monitor  7. Pseudophakia OU  - s/p CE/IOL OU  - beautiful surgery, doing well  - monitor   Ophthalmic Meds Ordered this visit:  Meds ordered this encounter  Medications  . prednisoLONE acetate (PRED FORTE) 1 % ophthalmic suspension    Sig: Place 1 drop into the left eye 4 (four) times daily.    Dispense:  10 mL    Refill:  0       Return in about 1 month (around 07/21/2017) for F/U CME OS, Dilated exam, OCT.  There are no Patient Instructions on file for this visit.   Explained the diagnoses, plan, and follow up with the patient and they expressed understanding.  Patient expressed understanding of the importance of proper follow up care.   This document serves as a record of services personally performed by Gardiner Sleeper, MD, PhD. It was created on their behalf by Catha Brow, [place], a certified ophthalmic assistant. The creation of this record is the provider's dictation and/or activities during the visit.  Electronically signed by: Catha Brow, [place]  06/23/17 12:32 PM   Gardiner Sleeper, M.D., Ph.D. Diseases & Surgery of the Retina and [place] 06/23/17  I have reviewed the above documentation for accuracy and completeness, and I agree with the above. Gardiner Sleeper, M.D., Ph.D. 06/23/17 12:33 PM    Abbreviations: M myopia (nearsighted); A astigmatism; H hyperopia (farsighted); P presbyopia; Mrx spectacle prescription;  CTL contact lenses; OD right eye; OS left eye; OU both eyes  XT exotropia; ET esotropia; PEK punctate epithelial keratitis; PEE punctate epithelial erosions; DES dry eye syndrome; MGD meibomian gland dysfunction; ATs artificial tears; PFAT's preservative free artificial tears; [place] nuclear sclerotic cataract; PSC posterior subcapsular  cataract; ERM epi-retinal membrane; PVD posterior vitreous detachment; RD retinal detachment;  DM diabetes mellitus; DR diabetic retinopathy; NPDR non-proliferative diabetic retinopathy; PDR proliferative diabetic retinopathy; CSME clinically significant macular edema; DME diabetic macular edema; dbh dot blot hemorrhages; CWS cotton wool spot; POAG primary open angle glaucoma; C/D cup-to-disc ratio; HVF humphrey visual field; GVF goldmann visual field; OCT optical coherence tomography; IOP intraocular pressure; BRVO Branch retinal vein occlusion; CRVO central retinal vein occlusion; CRAO central retinal artery occlusion; BRAO branch retinal artery occlusion; RT retinal tear; SB scleral buckle; PPV pars plana vitrectomy; VH Vitreous hemorrhage; PRP panretinal laser photocoagulation; IVK intravitreal kenalog; VMT vitreomacular traction; MH Macular hole;  NVD neovascularization of the disc; NVE neovascularization elsewhere; AREDS age related eye disease study; ARMD age related macular degeneration; POAG primary open angle glaucoma; EBMD epithelial/anterior basement membrane dystrophy; ACIOL anterior chamber intraocular lens; IOL intraocular lens; PCIOL posterior chamber intraocular lens; Phaco/IOL phacoemulsification with intraocular lens placement; [place] photorefractive keratectomy; LASIK laser assisted in situ keratomileusis; HTN hypertension; DM diabetes mellitus; COPD chronic obstructive pulmonary disease

## 2017-06-23 ENCOUNTER — Encounter (INDEPENDENT_AMBULATORY_CARE_PROVIDER_SITE_OTHER): Payer: Self-pay | Admitting: Ophthalmology

## 2017-06-23 ENCOUNTER — Ambulatory Visit (INDEPENDENT_AMBULATORY_CARE_PROVIDER_SITE_OTHER): Payer: Medicare Other | Admitting: Ophthalmology

## 2017-06-23 DIAGNOSIS — H401113 Primary open-angle glaucoma, right eye, severe stage: Secondary | ICD-10-CM | POA: Diagnosis not present

## 2017-06-23 DIAGNOSIS — H43813 Vitreous degeneration, bilateral: Secondary | ICD-10-CM | POA: Diagnosis not present

## 2017-06-23 DIAGNOSIS — H3554 Dystrophies primarily involving the retinal pigment epithelium: Secondary | ICD-10-CM | POA: Diagnosis not present

## 2017-06-23 DIAGNOSIS — H3581 Retinal edema: Secondary | ICD-10-CM

## 2017-06-23 DIAGNOSIS — Z961 Presence of intraocular lens: Secondary | ICD-10-CM

## 2017-06-23 DIAGNOSIS — H35352 Cystoid macular degeneration, left eye: Secondary | ICD-10-CM | POA: Diagnosis not present

## 2017-06-23 DIAGNOSIS — H401122 Primary open-angle glaucoma, left eye, moderate stage: Secondary | ICD-10-CM

## 2017-06-23 MED ORDER — PREDNISOLONE ACETATE 1 % OP SUSP: 1.0000 [drp] | Freq: Four times a day (QID) | OPHTHALMIC | 0 refills | Status: DC

## 2017-07-20 NOTE — Progress Notes (Addendum)
[facility] Note  07/21/2017     CHIEF COMPLAINT Patient presents for Retina Follow Up   HISTORY OF PRESENT ILLNESS: Donald Hawkins is a 84 y.o. male who presents to the clinic today for:   HPI    Retina Follow Up    Patient presents with  Other.  In left eye.  This started 3 months ago.  Severity is moderate.  Duration of 4 weeks.  Since onset it is stable.  I, the attending physician,  performed the HPI with the patient and updated documentation appropriately.          Comments    F/U CME OS; Pt states he feels OS VA is slightly improved; Pt states he no longer has any "vertical or horizontal distortion"; Pt denies floaters, denies flashes; Pt reports using PF OS QID and Ilevro OS BID as directed; Pt states he continues to use brimonidine OU qhs and dorzolamide-timolol OU BID as directed by Dr. Katy Fitch; Pt reports he needs refill of Ilevro;        Last edited by Bernarda Caffey, MD on 07/21/2017 10:27 AM. (History)      Referring physician: Leonard Downing, MD [place], [place] [address]  HISTORICAL INFORMATION:   Selected notes from the MEDICAL RECORD NUMBER Referred by Dr. Elliot Dally for concern of possible exudative AMD OS;  LEE- 02.12.19 (R. Groat) [BCVA OD: 20/50 OS: 20/40+2] Ocular Hx- pseudophakia OU (OD 07.20.15 - R. Groat, OS 10.24.18 - R. Groat) , S/P Yag PI OU (03.2015 - R. Groat), S/P SLT OD (04.2016 - R. Groat), narrow angles OU, POAG OU, DES OU, CME OS, currently on lumigan OU qhs, dorzolamide-timolol OU BID, and Ilevero OS qd PMH- HTN, hx removal of skin ca    CURRENT MEDICATIONS: Current Outpatient Medications (Ophthalmic Drugs)  Medication Sig  . bimatoprost (LUMIGAN) 0.03 % ophthalmic solution 1 drop at bedtime.  . brimonidine (ALPHAGAN) 0.15 % ophthalmic solution Place 1 drop into both eyes 2 (two) times daily.  . dorzolamide-timolol (COSOPT) 22.3-6.8 MG/ML ophthalmic solution Place 1 drop into both eyes 2  (two) times daily.  . nepafenac (ILEVRO) 0.3 % ophthalmic suspension Place 1 drop into the left eye 2 (two) times daily.  . prednisoLONE acetate (PRED FORTE) 1 % ophthalmic suspension Place 1 drop into the left eye 4 (four) times daily.   No current facility-administered medications for this visit.  (Ophthalmic Drugs)   No current outpatient medications on file. (Other)   No current facility-administered medications for this visit.  (Other)      REVIEW OF SYSTEMS: ROS    Positive for: Eyes   Negative for: Constitutional, Gastrointestinal, Neurological, Skin, Genitourinary, Musculoskeletal, HENT, Endocrine, Cardiovascular, Respiratory, Psychiatric, Allergic/Imm, Heme/Lymph   Last edited by Cherrie Gauze, COA on 07/21/2017  9:02 AM. (History)       ALLERGIES No Known Allergies  PAST MEDICAL HISTORY History reviewed. No pertinent past medical history. Past Surgical History:  Procedure Laterality Date  . CATARACT EXTRACTION    . EYE SURGERY    . NASAL SEPTUM SURGERY    . NASAL SEPTUM SURGERY     40 yrs ago    FAMILY HISTORY Family History  Problem Relation Age of Onset  . Emphysema Mother   . Cancer Father     SOCIAL HISTORY Social History   Tobacco Use  . Smoking status: Never Smoker  . Smokeless tobacco: Never Used  Substance Use Topics  . Alcohol  use: Yes    Alcohol/week: 0.6 oz    Types: 1 Glasses of wine per week  . Drug use: No         OPHTHALMIC EXAM:  Base Eye Exam    Visual Acuity (Snellen - Linear)      Right Left   Dist [place] 20/70 20/40 +2   Dist ph [place] 20/50 20/30 +2       Tonometry (Tonopen, 9:14 AM)      Right Left   Pressure 10 12       Pupils      Dark Light Shape React APD   Right 2 1.5 Round Minimal None   Left 2 1.5 Round Minimal None       Visual Fields (Counting fingers)      Left Right    Full    Restrictions  Partial outer superior temporal, inferior temporal, superior nasal, inferior nasal deficiencies        Extraocular Movement      Right Left    Full, Ortho Full, Ortho       Neuro/Psych    Oriented x3:  Yes   Mood/Affect:  Normal       Dilation    Both eyes:  1.0% Mydriacyl, 2.5% Phenylephrine @ 9:14 AM        Slit Lamp and Fundus Exam    Slit Lamp Exam      Right Left   Lids/Lashes Meibomian gland dysfunction, Telangiectasia Meibomian gland dysfunction, Telangiectasia, Scurf   Conjunctiva/Sclera White and quiet White and quiet   Cornea Arcus Arcus   Anterior Chamber Deep and quiet Deep and 0.5+ cell/pigment   Iris Round and moderatly dilated to 52mm, temporal PI, Transillumination defects at 0800 Round and moderatly dilated to 53mm, Transillumination defects nasally and temporally   Lens PC IOL in good position, trace Posterior capsular opacification PC IOL in good position   Vitreous Vitreous syneresis, Posterior vitreous detachment Vitreous syneresis, Posterior vitreous detachment       Fundus Exam      Right Left   Disc 3-4+ Pallor, cupping Temporal Pallor   C/D Ratio 0.9 0.4   Macula Blunted foveal reflex, +central SRF/vitelliform lesion, pigment mottling and clumping Blunted foveal reflex, +SRF/vitelliform lesion, +CME / cystic changes - interval  Improvement, almost resolved; no heme   Vessels Vascular attenuation, AV crossing changes Vascular attenuation, AV crossing changes   Periphery Attached Attached          IMAGING AND PROCEDURES  Imaging and Procedures for 06/23/17  OCT, Retina - OU - Both Eyes       Right Eye Quality was good. Central Foveal Thickness: 274. Progression has been stable. Findings include abnormal foveal contour, subretinal fluid, retinal drusen , no IRF (Central vitelliform-like lesion - improved).   Left Eye Quality was good. Central Foveal Thickness: 298. Progression has improved. Findings include abnormal foveal contour, intraretinal fluid, subretinal fluid, retinal drusen , outer retinal atrophy (Central vitelliform-like lesion with  overlying CME - further improvement from prior).   Notes *Images captured and stored on drive  Diagnosis / Impression:  OD: small central vitelliform like lesion OS: small central vitelliform like lesion; further improvement in overlying CME from prior  Clinical management:  See below  Abbreviations: NFP - Normal foveal profile. CME - cystoid macular edema. PED - pigment epithelial detachment. IRF - intraretinal fluid. SRF - subretinal fluid. EZ - ellipsoid zone. ERM - epiretinal membrane. ORA - outer retinal atrophy. ORT - outer retinal  tubulation. SRHM - subretinal hyper-reflective material                  ASSESSMENT/PLAN:    ICD-10-CM   1. CME (cystoid macular edema), left H35.352   2. Retinal edema H35.81 OCT, Retina - OU - Both Eyes  3. Vitelliform lesion of macula H35.54   4. Posterior vitreous detachment of both eyes H43.813   5. Primary open angle glaucoma (POAG) of right eye, severe stage H40.1113   6. Primary open angle glaucoma (POAG) of left eye, moderate stage H40.1122   7. Pseudophakia of both eyes Z96.1     1,2. Cystoid macular edema, OS-  - OCT on presentation showed+cystoid IRF OS - FA at presentation (2.18.19) showed petaloid leakage about the fovea w/ hyperfluorescence of the disc consistent w/ post op CME - s/p phaco/PCIOL 12/2016 -- suspect rebound / postoperative CME OS - differential could include prostaglandin use (lumigan) - today, further interval improvement on topical Ilevro and PF - discussed findings, prognosis and treatment options - continue Ilevro BID OS and PF QID OS - continue holding lumigan and continue brimonidine qhs for now -- IOP has remained good and no adverse effects of brim - f/u in 3-4 weeks  3. Vitelliform-like Dystrophy OU-  - mild central vitelliform like lesions OU - FA without obvious underlying CNVM - will monitor for now and reassess once #1 improved  4. PVD / vitreous syneresis OU-  Discussed findings and  prognosis  No RT or RD on 360 peripheral exam  Reviewed s/s of RT/RD  Strict return precautions for any such RT/RD signs/symptoms  5, 6. Primary Open Angle Glaucoma OU-  - severe stage OD, moderate stage OS - expertly managed by Dr. Elliot Dally - s/p SLT and YAG PI OU - continue dorzolamide-timolol OU BID - switched lumigan to brimonidine OU qhs due to association of prostaglandin analogs to CME - IOP good today  - continue to monitor  7. Pseudophakia OU  - s/p CE/IOL OU  - beautiful surgery, doing well  - monitor   Ophthalmic Meds Ordered this visit:  No orders of the defined types were placed in this encounter.      Return in about 6 weeks (around 09/01/2017) for F/U CME OS, DFE, OCT.  There are no Patient Instructions on file for this visit.   Explained the diagnoses, plan, and follow up with the patient and they expressed understanding.  Patient expressed understanding of the importance of proper follow up care.   This document serves as a record of services personally performed by Gardiner Sleeper, MD, PhD. It was created on their behalf by Catha Brow, [place], a certified ophthalmic assistant. The creation of this record is the provider's dictation and/or activities during the visit.  Electronically signed by: Catha Brow, COA  05.15.19 12:50 AM   Gardiner Sleeper, M.D., Ph.D. Diseases & Surgery of the Retina and Vitreous [facility] 07/21/17  I have reviewed the above documentation for accuracy and completeness, and I agree with the above. Gardiner Sleeper, M.D., Ph.D. 07/22/17 12:50 AM    Abbreviations: M myopia (nearsighted); A astigmatism; H hyperopia (farsighted); P presbyopia; Mrx spectacle prescription;  CTL contact lenses; OD right eye; OS left eye; OU both eyes  XT exotropia; ET esotropia; PEK punctate epithelial keratitis; PEE punctate epithelial erosions; DES dry eye syndrome; MGD meibomian gland dysfunction; ATs artificial tears;  PFAT's preservative free artificial tears; [place] nuclear sclerotic cataract; PSC posterior subcapsular  cataract; ERM epi-retinal membrane; PVD posterior vitreous detachment; RD retinal detachment; DM diabetes mellitus; DR diabetic retinopathy; NPDR non-proliferative diabetic retinopathy; PDR proliferative diabetic retinopathy; CSME clinically significant macular edema; DME diabetic macular edema; dbh dot blot hemorrhages; CWS cotton wool spot; POAG primary open angle glaucoma; C/D cup-to-disc ratio; HVF humphrey visual field; GVF goldmann visual field; OCT optical coherence tomography; IOP intraocular pressure; BRVO Branch retinal vein occlusion; CRVO central retinal vein occlusion; CRAO central retinal artery occlusion; BRAO branch retinal artery occlusion; RT retinal tear; SB scleral buckle; PPV pars plana vitrectomy; VH Vitreous hemorrhage; PRP panretinal laser photocoagulation; IVK intravitreal kenalog; VMT vitreomacular traction; MH Macular hole;  NVD neovascularization of the disc; NVE neovascularization elsewhere; AREDS age related eye disease study; ARMD age related macular degeneration; POAG primary open angle glaucoma; EBMD epithelial/anterior basement membrane dystrophy; ACIOL anterior chamber intraocular lens; IOL intraocular lens; PCIOL posterior chamber intraocular lens; Phaco/IOL phacoemulsification with intraocular lens placement; [place] photorefractive keratectomy; LASIK laser assisted in situ keratomileusis; HTN hypertension; DM diabetes mellitus; COPD chronic obstructive pulmonary disease

## 2017-07-21 ENCOUNTER — Encounter (INDEPENDENT_AMBULATORY_CARE_PROVIDER_SITE_OTHER): Payer: Self-pay | Admitting: Ophthalmology

## 2017-07-21 ENCOUNTER — Ambulatory Visit (INDEPENDENT_AMBULATORY_CARE_PROVIDER_SITE_OTHER): Payer: Medicare Other | Admitting: Ophthalmology

## 2017-07-21 DIAGNOSIS — H3581 Retinal edema: Secondary | ICD-10-CM

## 2017-07-21 DIAGNOSIS — H3554 Dystrophies primarily involving the retinal pigment epithelium: Secondary | ICD-10-CM

## 2017-07-21 DIAGNOSIS — H401122 Primary open-angle glaucoma, left eye, moderate stage: Secondary | ICD-10-CM | POA: Diagnosis not present

## 2017-07-21 DIAGNOSIS — H35352 Cystoid macular degeneration, left eye: Secondary | ICD-10-CM

## 2017-07-21 DIAGNOSIS — H401113 Primary open-angle glaucoma, right eye, severe stage: Secondary | ICD-10-CM

## 2017-07-21 DIAGNOSIS — Z961 Presence of intraocular lens: Secondary | ICD-10-CM

## 2017-07-21 DIAGNOSIS — H43813 Vitreous degeneration, bilateral: Secondary | ICD-10-CM | POA: Diagnosis not present

## 2017-07-22 ENCOUNTER — Encounter (INDEPENDENT_AMBULATORY_CARE_PROVIDER_SITE_OTHER): Payer: Self-pay | Admitting: Ophthalmology

## 2017-08-30 NOTE — Progress Notes (Signed)
[facility] Note  09/01/2017     CHIEF COMPLAINT Patient presents for Retina Follow Up   HISTORY OF PRESENT ILLNESS: Donald Hawkins is a 84 y.o. male who presents to the clinic today for:   HPI    Retina Follow Up    Patient presents with  Other.  In left eye.  Severity is moderate.  Duration of 6 weeks.  Since onset it is stable.  I, the attending physician,  performed the HPI with the patient and updated documentation appropriately.          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 (History)      Referring physician: Leonard Downing, MD [place], [place] [address]  HISTORICAL INFORMATION:   Selected notes from the MEDICAL RECORD NUMBER Referred by Dr. Elliot Dally for concern of possible exudative AMD OS;  LEE- 02.12.19 (R. Groat) [BCVA OD: 20/50 OS: 20/40+2] Ocular Hx- pseudophakia OU (OD 07.20.15 - R. Groat, OS 10.24.18 - R. Groat) , S/P Yag PI OU (03.2015 - R. Groat), S/P SLT OD (04.2016 - R. Groat), narrow angles OU, POAG OU, DES OU, CME OS, currently on lumigan OU qhs, dorzolamide-timolol OU BID, and Ilevero OS qd PMH- HTN, hx removal of skin ca    CURRENT MEDICATIONS: Current Outpatient Medications (Ophthalmic Drugs)  Medication Sig  . brimonidine (ALPHAGAN) 0.15 % ophthalmic solution Place 1 drop into both eyes 2 (two) times daily.  . dorzolamide-timolol (COSOPT) 22.3-6.8 MG/ML ophthalmic solution Place 1 drop into both eyes 2 (two) times daily.  . nepafenac (ILEVRO) 0.3 % ophthalmic suspension Place 1 drop into the left eye 2 (two) times daily.  . prednisoLONE acetate (PRED FORTE) 1 % ophthalmic suspension Place 1 drop into the left eye 4 (four) times daily.  . bimatoprost (LUMIGAN)  0.03 % ophthalmic solution 1 drop at bedtime.  . brimonidine (ALPHAGAN) 0.15 % ophthalmic solution Place 1 drop into the left eye 2 (two) times daily.  . prednisoLONE acetate (PRED FORTE) 1 % ophthalmic suspension Place 1 drop into the left eye 3 (three) times daily.   No current facility-administered medications for this visit.  (Ophthalmic Drugs)   No current outpatient medications on file. (Other)   No current facility-administered medications for this visit.  (Other)      REVIEW OF SYSTEMS: ROS    Positive for: Eyes   Negative for: Constitutional, Gastrointestinal, Neurological, Skin, Genitourinary, Musculoskeletal, HENT, Endocrine, Cardiovascular, Respiratory, Psychiatric, Allergic/Imm, Heme/Lymph   Last edited by Debbrah Alar, COT on 09/01/2017  9:02 AM. (History)       ALLERGIES No Known Allergies  PAST MEDICAL HISTORY History reviewed. No pertinent past medical history. Past Surgical History:  Procedure Laterality Date  . CATARACT EXTRACTION    . EYE SURGERY    . NASAL SEPTUM SURGERY    . NASAL SEPTUM SURGERY     40 yrs ago    FAMILY HISTORY Family History  Problem Relation Age of Onset  . Emphysema Mother   . Cancer Father     SOCIAL HISTORY Social History   Tobacco Use  . Smoking status: Never Smoker  . Smokeless tobacco: Never Used  Substance Use Topics  . Alcohol use: Yes    Alcohol/week: 0.6 oz    Types: 1 Glasses of wine per week  . Drug use: No         OPHTHALMIC EXAM:  Base Eye Exam    Visual Acuity (Snellen - Linear)      Right Left   Dist [place] 20/60 +2 20/30 -2   Dist ph [place] 20/50 +2 NI       Tonometry (Tonopen, 9:09 AM)      Right Left   Pressure 10 14       Pupils      Dark Light Shape React APD   Right 2 1.5 Round Minimal None   Left 2 1.5 Round Minimal None       Visual Fields (Counting fingers)      Left Right    Full    Restrictions  Partial outer superior temporal, inferior temporal, superior nasal, inferior  nasal deficiencies       Extraocular Movement      Right Left    Full, Ortho Full, Ortho       Neuro/Psych    Oriented x3:  Yes   Mood/Affect:  Normal       Dilation    Both eyes:  1.0% Mydriacyl, 2.5% Phenylephrine @ 9:09 AM        Slit Lamp and Fundus Exam    Slit Lamp Exam      Right Left   Lids/Lashes Meibomian gland dysfunction, Telangiectasia Meibomian gland dysfunction, Telangiectasia, Scurf   Conjunctiva/Sclera White and quiet White and quiet   Cornea Arcus Arcus   Anterior Chamber Deep and quiet Deep and quiet, no cell or flare   Iris Round and moderatly dilated to 69mm, temporal PI, Transillumination defects at 0800 Round and moderatly dilated to 49mm, Transillumination defects nasally and temporally   Lens PC IOL in good position, trace Posterior capsular opacification PC IOL in good position   Vitreous Vitreous syneresis, Posterior vitreous detachment Vitreous syneresis, Posterior vitreous detachment       Fundus Exam      Right Left   Disc 3-4+ Pallor, cupping Temporal Pallor   C/D Ratio 0.9 0.5   Macula Blunted foveal reflex, +central SRF/vitelliform lesion, pigment mottling and clumping Blunted foveal reflex, +SRF/vitelliform lesion, +CME / cystic changes resolved; no heme   Vessels Vascular attenuation, AV crossing changes Vascular attenuation, AV crossing changes   Periphery Attached Attached          IMAGING AND PROCEDURES  Imaging and Procedures for 06/23/17  OCT, Retina - OU - Both Eyes       Right Eye Quality was good. Central Foveal Thickness: 276. Progression has been stable. Findings include abnormal foveal contour, subretinal fluid, retinal drusen , no IRF (Central vitelliform-like lesion - stable).   Left Eye Quality was good. Central Foveal Thickness: 283. Progression has improved. Findings include abnormal foveal contour, subretinal fluid, retinal drusen , outer retinal atrophy, no IRF (Central vitelliform-like lesion with overlying CME -  stable with interval resolution of SRF).   Notes *Images captured and stored on drive  Diagnosis / Impression:  small central vitelliform like lesion OU -- stable Interval resolution in IRF OS  Clinical management:  See below  Abbreviations: NFP - Normal foveal profile. CME - cystoid macular edema. PED - pigment epithelial detachment. IRF - intraretinal fluid. SRF - subretinal fluid. EZ - ellipsoid zone. ERM - epiretinal membrane. ORA - outer retinal atrophy. ORT - outer  retinal tubulation. SRHM - subretinal hyper-reflective material                  ASSESSMENT/PLAN:    ICD-10-CM   1. CME (cystoid macular edema), left H35.352 OCT, Retina - OU - Both Eyes  2. Retinal edema H35.81 OCT, Retina - OU - Both Eyes  3. Vitelliform lesion of macula H35.54   4. Posterior vitreous detachment of both eyes H43.813   5. Primary open angle glaucoma (POAG) of right eye, severe stage H40.1113   6. Primary open angle glaucoma (POAG) of left eye, moderate stage H40.1122   7. Pseudophakia of both eyes Z96.1     1,2. Cystoid macular edema, OS-  - OCT on presentation showed +cystoid IRF OS - FA at presentation (2.18.19) showed petaloid leakage about the fovea w/ hyperfluorescence of the disc consistent w/ post op CME - s/p phaco/PCIOL 12/2016 -- suspect rebound / postoperative CME OS - differential could include prostaglandin use (lumigan) - today, cystic IRF/CME resolved with stoppage of lumigan and topical Ilevro and PF - STOP Ilevro BID OS  - begin PF taper 3, 2, 1 q weekly OS - continue holding lumigan and continue brimonidine qhs for now -- IOP has remained good and no adverse effects of brim - f/u in 1 month  3. Vitelliform-like Dystrophy OU-  - mild central vitelliform like lesions OU - FA without obvious underlying CNVM - will monitor for now and reassess once #1 imporved  4. PVD / vitreous syneresis OU-  Discussed findings and prognosis  No RT or RD on 360 peripheral  exam  Reviewed s/s of RT/RD  Strict return precautions for any such RT/RD signs/symptoms  5, 6. Primary Open Angle Glaucoma OU-  - severe stage OD, moderate stage OS - expertly managed by Dr. Elliot Dally - s/p SLT and YAG PI OU - continue dorzolamide-timolol OU BID - switched lumigan to brimonidine OU qhs due to association of prostaglandin analogs to CME - IOP good today  - continue to monitor  7. Pseudophakia OU  - s/p CE/IOL OU  - beautiful surgery, doing well  - monitor   Ophthalmic Meds Ordered this visit:  Meds ordered this encounter  Medications  . prednisoLONE acetate (PRED FORTE) 1 % ophthalmic suspension    Sig: Place 1 drop into the left eye 3 (three) times daily.    Dispense:  10 mL    Refill:  0  . brimonidine (ALPHAGAN) 0.15 % ophthalmic solution    Sig: Place 1 drop into the left eye 2 (two) times daily.    Dispense:  10 mL    Refill:  5       Return in about 1 month (around 10/01/2017) for F/U CME OS, DFE, OCT.  There are no Patient Instructions on file for this visit.   Explained the diagnoses, plan, and follow up with the patient and they expressed understanding.  Patient expressed understanding of the importance of proper follow up care.   This document serves as a record of services personally performed by Gardiner Sleeper, MD, PhD. It was created on their behalf by Catha Brow, [place], a certified ophthalmic assistant. The creation of this record is the provider's dictation and/or activities during the visit.  Electronically signed by: Catha Brow, [place]  06.25.19 9:48 AM   Gardiner Sleeper, M.D., Ph.D. Diseases & Surgery of the Retina and Vitreous [facility]  I have reviewed the above documentation for accuracy and completeness,  and I agree with the above. Gardiner Sleeper, M.D., Ph.D. 09/01/17 9:50 AM    Abbreviations: M myopia (nearsighted); A astigmatism; H hyperopia (farsighted); P presbyopia; Mrx spectacle  prescription;  CTL contact lenses; OD right eye; OS left eye; OU both eyes  XT exotropia; ET esotropia; PEK punctate epithelial keratitis; PEE punctate epithelial erosions; DES dry eye syndrome; MGD meibomian gland dysfunction; ATs artificial tears; PFAT's preservative free artificial tears; [place] nuclear sclerotic cataract; PSC posterior subcapsular cataract; ERM epi-retinal membrane; PVD posterior vitreous detachment; RD retinal detachment; DM diabetes mellitus; DR diabetic retinopathy; NPDR non-proliferative diabetic retinopathy; PDR proliferative diabetic retinopathy; CSME clinically significant macular edema; DME diabetic macular edema; dbh dot blot hemorrhages; CWS cotton wool spot; POAG primary open angle glaucoma; C/D cup-to-disc ratio; HVF humphrey visual field; GVF goldmann visual field; OCT optical coherence tomography; IOP intraocular pressure; BRVO Branch retinal vein occlusion; CRVO central retinal vein occlusion; CRAO central retinal artery occlusion; BRAO branch retinal artery occlusion; RT retinal tear; SB scleral buckle; PPV pars plana vitrectomy; VH Vitreous hemorrhage; PRP panretinal laser photocoagulation; IVK intravitreal kenalog; VMT vitreomacular traction; MH Macular hole;  NVD neovascularization of the disc; NVE neovascularization elsewhere; AREDS age related eye disease study; ARMD age related macular degeneration; POAG primary open angle glaucoma; EBMD epithelial/anterior basement membrane dystrophy; ACIOL anterior chamber intraocular lens; IOL intraocular lens; PCIOL posterior chamber intraocular lens; Phaco/IOL phacoemulsification with intraocular lens placement; [place] photorefractive keratectomy; LASIK laser assisted in situ keratomileusis; HTN hypertension; DM diabetes mellitus; COPD chronic obstructive pulmonary disease

## 2017-09-01 ENCOUNTER — Ambulatory Visit (INDEPENDENT_AMBULATORY_CARE_PROVIDER_SITE_OTHER): Payer: Medicare Other | Admitting: Ophthalmology

## 2017-09-01 ENCOUNTER — Encounter (INDEPENDENT_AMBULATORY_CARE_PROVIDER_SITE_OTHER): Payer: Self-pay | Admitting: Ophthalmology

## 2017-09-01 DIAGNOSIS — Z961 Presence of intraocular lens: Secondary | ICD-10-CM

## 2017-09-01 DIAGNOSIS — H401122 Primary open-angle glaucoma, left eye, moderate stage: Secondary | ICD-10-CM | POA: Diagnosis not present

## 2017-09-01 DIAGNOSIS — H43813 Vitreous degeneration, bilateral: Secondary | ICD-10-CM

## 2017-09-01 DIAGNOSIS — H3581 Retinal edema: Secondary | ICD-10-CM

## 2017-09-01 DIAGNOSIS — H35352 Cystoid macular degeneration, left eye: Secondary | ICD-10-CM | POA: Diagnosis not present

## 2017-09-01 DIAGNOSIS — H401113 Primary open-angle glaucoma, right eye, severe stage: Secondary | ICD-10-CM | POA: Diagnosis not present

## 2017-09-01 DIAGNOSIS — H3554 Dystrophies primarily involving the retinal pigment epithelium: Secondary | ICD-10-CM

## 2017-09-01 MED ORDER — PREDNISOLONE ACETATE 1 % OP SUSP: 1.0000 [drp] | Freq: Three times a day (TID) | OPHTHALMIC | 0 refills | Status: DC

## 2017-09-01 MED ORDER — BRIMONIDINE TARTRATE 0.15 % OP SOLN: 1.0000 [drp] | Freq: Two times a day (BID) | OPHTHALMIC | 5 refills | Status: DC

## 2017-09-28 NOTE — Progress Notes (Signed)
[facility] Note  09/29/2017     CHIEF COMPLAINT Patient presents for Retina Follow Up   HISTORY OF PRESENT ILLNESS: Donald Hawkins is a 84 y.o. male who presents to the clinic today for:   HPI    Retina Follow Up    Patient presents with  PVD.  In left eye.  This started 2 months ago.  Severity is mild.  Since onset it is gradually improving.  I, the attending physician,  performed the HPI with the patient and updated documentation appropriately.          Comments    F/U CME OS. Patient states he feels his vision "is a little better,things are not as blurry" Pt is using Dorzolamide and Brimonidine gtt's .       Last edited by Bernarda Caffey, MD on 09/29/2017 11:54 PM. (History)    Pt states he discontinued PF x 1 week ago;   Referring physician: Clent Jacks, MD [address] [place], [place] [address]  HISTORICAL INFORMATION:   Selected notes from the MEDICAL RECORD NUMBER Referred by Dr. Elliot Dally for concern of possible exudative AMD OS;  LEE- 02.12.19 (R. Groat) [BCVA OD: 20/50 OS: 20/40+2] Ocular Hx- pseudophakia OU (OD 07.20.15 - R. Groat, OS 10.24.18 - R. Groat) , S/P Yag PI OU (03.2015 - R. Groat), S/P SLT OD (04.2016 - R. Groat), narrow angles OU, POAG OU, DES OU, CME OS, currently on lumigan OU qhs, dorzolamide-timolol OU BID, and Ilevero OS qd PMH- HTN, hx removal of skin ca    CURRENT MEDICATIONS: Current Outpatient Medications (Ophthalmic Drugs)  Medication Sig  . bimatoprost (LUMIGAN) 0.03 % ophthalmic solution 1 drop at bedtime.  . brimonidine (ALPHAGAN) 0.15 % ophthalmic solution Place 1 drop into both eyes 2 (two) times daily.  . brimonidine (ALPHAGAN) 0.15 % ophthalmic solution Place 1 drop into the left eye 2 (two) times daily.  . dorzolamide-timolol (COSOPT) 22.3-6.8 MG/ML ophthalmic solution Place 1 drop into both eyes 2 (two) times daily.  . nepafenac (ILEVRO) 0.3 % ophthalmic suspension Place 1 drop into the left eye 2  (two) times daily.  . prednisoLONE acetate (PRED FORTE) 1 % ophthalmic suspension Place 1 drop into the left eye 4 (four) times daily.  . prednisoLONE acetate (PRED FORTE) 1 % ophthalmic suspension Place 1 drop into the left eye 3 (three) times daily.   No current facility-administered medications for this visit.  (Ophthalmic Drugs)   No current outpatient medications on file. (Other)   No current facility-administered medications for this visit.  (Other)      REVIEW OF SYSTEMS: ROS    Positive for: Eyes   Negative for: Constitutional, Gastrointestinal, Neurological, Skin, Genitourinary, Musculoskeletal, HENT, Endocrine, Cardiovascular, Respiratory, Psychiatric, Allergic/Imm, Heme/Lymph   Last edited by Zenovia Jordan, LPN on 9/89/2119  4:17 AM. (History)       ALLERGIES No Known Allergies  PAST MEDICAL HISTORY History reviewed. No pertinent past medical history. Past Surgical History:  Procedure Laterality Date  . CATARACT EXTRACTION    . EYE SURGERY    . NASAL SEPTUM SURGERY    . NASAL SEPTUM SURGERY     40 yrs ago    FAMILY HISTORY Family History  Problem Relation Age of Onset  . Emphysema Mother   . Cancer Father     SOCIAL HISTORY Social History   Tobacco Use  . Smoking status: Never Smoker  . Smokeless tobacco: Never Used  Substance Use  Topics  . Alcohol use: Yes    Alcohol/week: 0.6 oz    Types: 1 Glasses of wine per week  . Drug use: No         OPHTHALMIC EXAM:  Base Eye Exam    Visual Acuity (Snellen - Linear)      Right Left   Dist [place] 20/80 20/30   Dist ph [place] 20/50 20/25       Tonometry (Tonopen, 8:47 AM)      Right Left   Pressure 18 15       Pupils      Dark Light Shape React APD   Right 3 2 Round Minimal None   Left 3 2 Round Minimal None       Visual Fields (Counting fingers)      Left Right    Full    Restrictions  Partial outer superior temporal, inferior temporal, superior nasal, inferior nasal deficiencies        Extraocular Movement      Right Left    Full, Ortho Full, Ortho       Neuro/Psych    Oriented x3:  Yes   Mood/Affect:  Normal       Dilation    Both eyes:  1.0% Mydriacyl, 2.5% Phenylephrine @ 8:47 AM        Slit Lamp and Fundus Exam    Slit Lamp Exam      Right Left   Lids/Lashes Meibomian gland dysfunction, Telangiectasia Meibomian gland dysfunction, Telangiectasia, Scurf   Conjunctiva/Sclera White and quiet White and quiet   Cornea Arcus Arcus   Anterior Chamber Deep and quiet Deep and quiet, no cell or flare   Iris Round and moderatly dilated to 50mm, temporal PI, Transillumination defects at 0800 Round and moderatly dilated to 77mm, Transillumination defects nasally and temporally, PI at 0900   Lens PC IOL in good position, trace Posterior capsular opacification PC IOL in good position   Vitreous Vitreous syneresis, Posterior vitreous detachment Vitreous syneresis, Posterior vitreous detachment       Fundus Exam      Right Left   Disc 3-4+ Pallor, cupping Temporal Pallor   C/D Ratio 0.9 0.7   Macula Blunted foveal reflex, +central SRF/vitelliform lesion, pigment mottling and clumping Blunted foveal reflex, +SRF/vitelliform lesion, +CME / cystic changes resolved; no heme   Vessels Vascular attenuation, AV crossing changes Vascular attenuation, AV crossing changes   Periphery Attached Attached          IMAGING AND PROCEDURES  Imaging and Procedures for 06/23/17  OCT, Retina - OU - Both Eyes       Right Eye Quality was good. Central Foveal Thickness: 271. Progression has been stable. Findings include abnormal foveal contour, subretinal fluid, retinal drusen , no IRF (Central vitelliform-like lesion - stable).   Left Eye Quality was good. Central Foveal Thickness: 285. Progression has been stable. Findings include abnormal foveal contour, subretinal fluid, retinal drusen , outer retinal atrophy, no IRF (Central vitelliform-like lesion -- stable).   Notes *Images  captured and stored on drive  Diagnosis / Impression:  small central vitelliform like lesion OU -- stable no IRF OS  Clinical management:  See below  Abbreviations: NFP - Normal foveal profile. CME - cystoid macular edema. PED - pigment epithelial detachment. IRF - intraretinal fluid. SRF - subretinal fluid. EZ - ellipsoid zone. ERM - epiretinal membrane. ORA - outer retinal atrophy. ORT - outer retinal tubulation. SRHM - subretinal hyper-reflective material  ASSESSMENT/PLAN:    ICD-10-CM   1. CME (cystoid macular edema), left H35.352   2. Retinal edema H35.81 OCT, Retina - OU - Both Eyes  3. Vitelliform lesion of macula H35.54   4. Posterior vitreous detachment of both eyes H43.813   5. Primary open angle glaucoma (POAG) of right eye, severe stage H40.1113   6. Primary open angle glaucoma (POAG) of left eye, moderate stage H40.1122   7. Pseudophakia of both eyes Z96.1     1,2. Cystoid macular edema, OS- resolved - OCT on presentation showed +cystoid IRF OS - FA at presentation (2.18.19) showed petaloid leakage about the fovea w/ hyperfluorescence of the disc consistent w/ post op CME - s/p phaco/PCIOL 12/2016 -- suspect rebound / postoperative CME OS - differential could include prostaglandin use (lumigan) - today, cystic IRF/CME remains resolved with stoppage of lumigan and topical Ilevro and tapering off PF - continue holding lumigan and continue brimonidine qhs for now -- IOP has remained good and no adverse effects of brim - f/u 9-12 months  3. Vitelliform-like Dystrophy OU-  - mild central vitelliform like lesions OU - stable - FA without obvious underlying CNVM - monitor  4. PVD / vitreous syneresis OU-  Discussed findings and prognosis  No RT or RD on 360 peripheral exam  Reviewed s/s of RT/RD  Strict return precautions for any such RT/RD signs/symptoms  5, 6. Primary Open Angle Glaucoma OU-  - severe stage OD, moderate stage OS -  expertly managed by Dr. Elliot Dally - s/p SLT and YAG PI OU - continue dorzolamide-timolol OU BID - switched lumigan to brimonidine OU qhs due to association of prostaglandin analogs to CME - IOP good today  - continue to monitor  7. Pseudophakia OU  - s/p CE/IOL OU  - beautiful surgery, doing well  - monitor   Ophthalmic Meds Ordered this visit:  No orders of the defined types were placed in this encounter.      Return in about 1 year (around 09/30/2018) for F/U CME OS, DFE, OCT.  There are no Patient Instructions on file for this visit.   Explained the diagnoses, plan, and follow up with the patient and they expressed understanding.  Patient expressed understanding of the importance of proper follow up care.   This document serves as a record of services personally performed by Gardiner Sleeper, MD, PhD. It was created on their behalf by Catha Brow, [place], a certified ophthalmic assistant. The creation of this record is the provider's dictation and/or activities during the visit.  Electronically signed by: Catha Brow, [place]  07.25.19 12:17 AM    Gardiner Sleeper, M.D., Ph.D. Diseases & Surgery of the Retina and Vitreous [facility]  I have reviewed the above documentation for accuracy and completeness, and I agree with the above. Gardiner Sleeper, M.D., Ph.D. 09/30/17 12:17 AM     Abbreviations: M myopia (nearsighted); A astigmatism; H hyperopia (farsighted); P presbyopia; Mrx spectacle prescription;  CTL contact lenses; OD right eye; OS left eye; OU both eyes  XT exotropia; ET esotropia; PEK punctate epithelial keratitis; PEE punctate epithelial erosions; DES dry eye syndrome; MGD meibomian gland dysfunction; ATs artificial tears; PFAT's preservative free artificial tears; [place] nuclear sclerotic cataract; PSC posterior subcapsular cataract; ERM epi-retinal membrane; PVD posterior vitreous detachment; RD retinal detachment; DM diabetes mellitus; DR  diabetic retinopathy; NPDR non-proliferative diabetic retinopathy; PDR proliferative diabetic retinopathy; CSME clinically significant macular edema; DME diabetic macular edema; dbh dot blot  hemorrhages; CWS cotton wool spot; POAG primary open angle glaucoma; C/D cup-to-disc ratio; HVF humphrey visual field; GVF goldmann visual field; OCT optical coherence tomography; IOP intraocular pressure; BRVO Branch retinal vein occlusion; CRVO central retinal vein occlusion; CRAO central retinal artery occlusion; BRAO branch retinal artery occlusion; RT retinal tear; SB scleral buckle; PPV pars plana vitrectomy; VH Vitreous hemorrhage; PRP panretinal laser photocoagulation; IVK intravitreal kenalog; VMT vitreomacular traction; MH Macular hole;  NVD neovascularization of the disc; NVE neovascularization elsewhere; AREDS age related eye disease study; ARMD age related macular degeneration; POAG primary open angle glaucoma; EBMD epithelial/anterior basement membrane dystrophy; ACIOL anterior chamber intraocular lens; IOL intraocular lens; PCIOL posterior chamber intraocular lens; Phaco/IOL phacoemulsification with intraocular lens placement; [place] photorefractive keratectomy; LASIK laser assisted in situ keratomileusis; HTN hypertension; DM diabetes mellitus; COPD chronic obstructive pulmonary disease

## 2017-09-29 ENCOUNTER — Ambulatory Visit (INDEPENDENT_AMBULATORY_CARE_PROVIDER_SITE_OTHER): Payer: Medicare Other | Admitting: Ophthalmology

## 2017-09-29 ENCOUNTER — Encounter (INDEPENDENT_AMBULATORY_CARE_PROVIDER_SITE_OTHER): Payer: Self-pay | Admitting: Ophthalmology

## 2017-09-29 DIAGNOSIS — H43813 Vitreous degeneration, bilateral: Secondary | ICD-10-CM

## 2017-09-29 DIAGNOSIS — H35352 Cystoid macular degeneration, left eye: Secondary | ICD-10-CM

## 2017-09-29 DIAGNOSIS — H3554 Dystrophies primarily involving the retinal pigment epithelium: Secondary | ICD-10-CM

## 2017-09-29 DIAGNOSIS — Z961 Presence of intraocular lens: Secondary | ICD-10-CM

## 2017-09-29 DIAGNOSIS — H3581 Retinal edema: Secondary | ICD-10-CM

## 2017-09-29 DIAGNOSIS — H401122 Primary open-angle glaucoma, left eye, moderate stage: Secondary | ICD-10-CM

## 2017-09-29 DIAGNOSIS — H401113 Primary open-angle glaucoma, right eye, severe stage: Secondary | ICD-10-CM

## 2017-11-22 ENCOUNTER — Other Ambulatory Visit (INDEPENDENT_AMBULATORY_CARE_PROVIDER_SITE_OTHER): Payer: Self-pay | Admitting: Ophthalmology

## 2017-11-22 MED ORDER — BRIMONIDINE TARTRATE 0.15 % OP SOLN: 1.0000 [drp] | Freq: Two times a day (BID) | OPHTHALMIC | 5 refills | Status: DC

## 2018-04-07 ENCOUNTER — Other Ambulatory Visit (INDEPENDENT_AMBULATORY_CARE_PROVIDER_SITE_OTHER): Payer: Self-pay | Admitting: Ophthalmology

## 2018-04-07 MED ORDER — BRIMONIDINE TARTRATE 0.2 % OP SOLN: 1.0000 [drp] | Freq: Two times a day (BID) | OPHTHALMIC | 11 refills | Status: DC

## 2019-06-20 ENCOUNTER — Other Ambulatory Visit (INDEPENDENT_AMBULATORY_CARE_PROVIDER_SITE_OTHER): Payer: Self-pay | Admitting: Ophthalmology

## 2020-01-05 ENCOUNTER — Ambulatory Visit (HOSPITAL_COMMUNITY): Admit: 2020-01-05 | Discharge status: Absent without leave | Payer: Self-pay

## 2020-01-05 ENCOUNTER — Other Ambulatory Visit: Payer: Self-pay

## 2020-01-05 ENCOUNTER — Ambulatory Visit (HOSPITAL_COMMUNITY)
Admission: EM | Admit: 2020-01-05 | Discharge: 2020-01-05 | Disposition: A | Discharge status: Home / Self Care | Payer: Medicare Other

## 2020-01-05 ENCOUNTER — Encounter (HOSPITAL_COMMUNITY): Payer: Self-pay | Admitting: *Deleted

## 2020-01-05 ENCOUNTER — Ambulatory Visit (INDEPENDENT_AMBULATORY_CARE_PROVIDER_SITE_OTHER): Payer: Medicare Other

## 2020-01-05 DIAGNOSIS — S52022A Displaced fracture of olecranon process without intraarticular extension of left ulna, initial encounter for closed fracture: Secondary | ICD-10-CM

## 2020-01-05 DIAGNOSIS — S5002XA Contusion of left elbow, initial encounter: Secondary | ICD-10-CM

## 2020-01-05 DIAGNOSIS — M25522 Pain in left elbow: Secondary | ICD-10-CM | POA: Diagnosis not present

## 2020-01-05 DIAGNOSIS — Y9355 Activity, bike riding: Secondary | ICD-10-CM | POA: Diagnosis not present
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
## 2020-01-05 NOTE — Discharge Instructions (Signed)
Fracture of the olecranon (proximal ulna) Keep splint in place until seen by orthopedics. Call on Monday for a follow up appointment.  Elevate to help with swelling.  Tylenol as needed for pain.

## 2020-01-05 NOTE — Progress Notes (Signed)
Orthopedic Tech Progress Note Patient Details:  Donald Hawkins 07-28-1928 [id number]  Ortho Devices Type of Ortho Device: Sugartong splint, Arm sling Ortho Device/Splint Location: ULE Ortho Device/Splint Interventions: Application, Ordered   Post Interventions Patient Tolerated: Well Instructions Provided: Care of device, Adjustment of device   Donald Hawkins A Helmut Hennon 01/05/2020, 7:32 PM
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
## 2021-01-09 ENCOUNTER — Telehealth: Payer: Self-pay | Admitting: Oncology

## 2021-01-09 NOTE — Telephone Encounter (Signed)
Scheduled appt per 11/4 referral. Spoke to pt's son, Gwyndolyn Saxon, per pt request. Gwyndolyn Saxon is aware of appt date and time.

## 2021-01-14 DIAGNOSIS — C44229 Squamous cell carcinoma of skin of left ear and external auricular canal: Secondary | ICD-10-CM | POA: Insufficient documentation

## 2021-01-15 ENCOUNTER — Other Ambulatory Visit: Payer: Self-pay | Admitting: Otolaryngology

## 2021-01-16 ENCOUNTER — Other Ambulatory Visit: Payer: Self-pay | Admitting: Otolaryngology

## 2021-01-16 DIAGNOSIS — C44229 Squamous cell carcinoma of skin of left ear and external auricular canal: Secondary | ICD-10-CM

## 2021-01-16 DIAGNOSIS — C770 Secondary and unspecified malignant neoplasm of lymph nodes of head, face and neck: Secondary | ICD-10-CM

## 2021-01-20 ENCOUNTER — Ambulatory Visit
Admission: RE | Admit: 2021-01-20 | Discharge: 2021-01-20 | Disposition: A | Discharge status: Home / Self Care | Payer: Medicare Other | Source: Ambulatory Visit | Attending: Otolaryngology | Admitting: Otolaryngology

## 2021-01-20 ENCOUNTER — Other Ambulatory Visit: Payer: Self-pay

## 2021-01-20 DIAGNOSIS — C44229 Squamous cell carcinoma of skin of left ear and external auricular canal: Secondary | ICD-10-CM

## 2021-01-20 DIAGNOSIS — C770 Secondary and unspecified malignant neoplasm of lymph nodes of head, face and neck: Secondary | ICD-10-CM

## 2021-01-20 MED ORDER — IOPAMIDOL (ISOVUE-300) INJECTION 61%
75.0000 mL | Freq: Once | INTRAVENOUS | Status: AC | PRN
  Administered 2021-01-20: 75 mL via INTRAVENOUS

## 2021-01-21 ENCOUNTER — Inpatient Hospital Stay: Payer: Medicare Other | Attending: Oncology | Admitting: Oncology

## 2021-01-21 VITALS — BP 182/89 | HR 89 | Temp 98.1°F | Resp 17 | Ht 70.0 in | Wt 170.0 lb

## 2021-01-21 DIAGNOSIS — C4440 Unspecified malignant neoplasm of skin of scalp and neck: Secondary | ICD-10-CM | POA: Diagnosis not present

## 2021-01-21 DIAGNOSIS — D2322 Other benign neoplasm of skin of left ear and external auricular canal: Secondary | ICD-10-CM

## 2021-01-21 NOTE — Pre-Procedure Instructions (Signed)
Surgical Instructions    Your procedure is scheduled on Monday 01/26/21.   Report to [facility] Main Entrance "A" at 06:30 A.M., then check in with the Admitting office.  Call this number if you have problems the morning of surgery:  [phone]   If you have any questions prior to your surgery date call [phone]: Open Monday-Friday 8am-4pm    Remember:  Do not eat or drink after midnight the night before your surgery     Take these medicines the morning of surgery with A SIP OF WATER   brimonidine (ALPHAGAN) 0.2 % ophthalmic solution  dorzolamide-timolol (COSOPT)   acetaminophen (TYLENOL)- If needed  polyvinyl alcohol (LIQUIFILM TEARS)- If needed   As of today, STOP taking any Aspirin (unless otherwise instructed by your surgeon) Aleve, Naproxen, Ibuprofen, Motrin, Advil, Goody's, BC's, all herbal medications, fish oil, and all vitamins.     After your COVID test   You are not required to quarantine however you are required to wear a well-fitting mask when you are out and around people not in your household.  If your mask becomes wet or soiled, replace with a new one.  Wash your hands often with soap and water for 20 seconds or clean your hands with an alcohol-based hand sanitizer that contains at least 60% alcohol.  Do not share personal items.  Notify your provider: if you are in close contact with someone who has COVID  or if you develop a fever of 100.4 or greater, sneezing, cough, sore throat, shortness of breath or body aches.             Do not wear jewelry or makeup Do not wear lotions, powders, perfumes/colognes, or deodorant. Do not shave 48 hours prior to surgery.  Men may shave face and neck. Do not bring valuables to the hospital. DO Not wear nail polish, gel polish, artificial nails, or any other type of covering on natural nails including finger and toenails. If patients have artificial nails, gel coating, etc. that need to be removed by a nail  salon, please have this removed prior to surgery or surgery may need to be canceled/delayed if the surgeon/ anesthesia feels like the patient is unable to be adequately monitored.             [facility] is not responsible for any belongings or valuables.  Do NOT Smoke (Tobacco/Vaping)  24 hours prior to your procedure  If you use a CPAP at night, you may bring your mask for your overnight stay.   Contacts, glasses, hearing aids, dentures or partials may not be worn into surgery, please bring cases for these belongings   For patients admitted to the hospital, discharge time will be determined by your treatment team.   Patients discharged the day of surgery will not be allowed to drive home, and someone needs to stay with them for 24 hours.  NO VISITORS WILL BE ALLOWED IN PRE-OP WHERE PATIENTS ARE PREPPED FOR SURGERY.  ONLY 1 SUPPORT PERSON MAY BE PRESENT IN THE WAITING ROOM WHILE YOU ARE IN SURGERY.  IF YOU ARE TO BE ADMITTED, ONCE YOU ARE IN YOUR ROOM YOU WILL BE ALLOWED TWO (2) VISITORS. 1 (ONE) VISITOR MAY STAY OVERNIGHT BUT MUST ARRIVE TO THE ROOM BY 8pm.  Minor children may have two parents present. Special consideration for safety and communication needs will be reviewed on a case by case basis.  Special instructions:    Oral Hygiene is also important to reduce your  risk of infection.  Remember - BRUSH YOUR TEETH THE MORNING OF SURGERY WITH YOUR REGULAR TOOTHPASTE   [facility]- Preparing For Surgery  Before surgery, you can play an important role. Because skin is not sterile, your skin needs to be as free of germs as possible. You can reduce the number of germs on your skin by washing with CHG (chlorahexidine gluconate) Soap before surgery.  CHG is an antiseptic cleaner which kills germs and bonds with the skin to continue killing germs even after washing.     Please do not use if you have an allergy to CHG or antibacterial soaps. If your skin becomes reddened/irritated stop using  the CHG.  Do not shave (including legs and underarms) for at least 48 hours prior to first CHG shower. It is OK to shave your face.  Please follow these instructions carefully.     Shower the NIGHT BEFORE SURGERY and the MORNING OF SURGERY with CHG Soap.   If you chose to wash your hair, wash your hair first as usual with your normal shampoo. After you shampoo, rinse your hair and body thoroughly to remove the shampoo.  Then [employer] and genitals (private parts) with your normal soap and rinse thoroughly to remove soap.  After that Use CHG Soap as you would any other liquid soap. You can apply CHG directly to the skin and wash gently with a scrungie or a clean washcloth.   Apply the CHG Soap to your body ONLY FROM THE NECK DOWN.  Do not use on open wounds or open sores. Avoid contact with your eyes, ears, mouth and genitals (private parts). Wash Face and genitals (private parts)  with your normal soap.   Wash thoroughly, paying special attention to the area where your surgery will be performed.  Thoroughly rinse your body with warm water from the neck down.  DO NOT shower/wash with your normal soap after using and rinsing off the CHG Soap.  Pat yourself dry with a CLEAN TOWEL.  Wear CLEAN PAJAMAS to bed the night before surgery  Place CLEAN SHEETS on your bed the night before your surgery  DO NOT SLEEP WITH PETS.   Day of Surgery:  Take a shower with CHG soap. Wear Clean/Comfortable clothing the morning of surgery Do not apply any deodorants/lotions.   Remember to brush your teeth WITH YOUR REGULAR TOOTHPASTE.   Please read over the following fact sheets that you were given.

## 2021-01-21 NOTE — Progress Notes (Signed)
Reason for the request:    Squamous cell carcinoma of the skin  HPI: I was asked by Dr. Winifred Olive to evaluate Mr. Donald Hawkins for metastatic squamous cell carcinoma.  He is a 87 year old man with a history of glaucoma and squamous cell carcinoma of the skin.  He had had a next epidural lesion diagnosed in 2019 that was surgically removed by Dr. Winifred Olive.  He also had a left superior helix lesion that was also removed by Mohs procedure in July 2022.    He developed a left lateral neck lesion that was biopsied on January 01, 2021 which showed a metastatic squamous cell carcinoma.  Based on these findings, he was evaluated by Dr. Para Skeans and CT scan of the neck obtained on January 20, 2021 showed a 1.0 cm cutaneous lesion overlapping the left zygomatic arch.  There is a necrotic left level II and level III/IV lymph nodes largest measuring up to 1.6 x 1.9 cm.  Based on these findings he is scheduled to have a neck dissection and left parotidectomy by Dr. Redmond Baseman on January 26, 2021.  Clinically, he reports feeling well without any major complaints.  He denies any nausea vomiting or abdominal pain.  He denies any excessive fatigue or tiredness.  His performance status quality of life remained intact.  He does not report any headaches, blurry vision, syncope or seizures. Does not report any fevers, chills or sweats.  Does not report any cough, wheezing or hemoptysis.  Does not report any chest pain, palpitation, orthopnea or leg edema.  Does not report any nausea, vomiting or abdominal pain.  Does not report any constipation or diarrhea.  Does not report any skeletal complaints.    Does not report frequency, urgency or hematuria.  Does not report any skin rashes or lesions. Does not report any heat or cold intolerance.  Does not report any lymphadenopathy or petechiae.  Does not report any anxiety or depression.  Remaining review of systems is negative.    No past medical history on file.:   Past Surgical History:   Procedure Laterality Date   CATARACT EXTRACTION     EYE SURGERY     NASAL SEPTUM SURGERY     NASAL SEPTUM SURGERY     40 yrs ago  :   Current Outpatient Medications:    acetaminophen (TYLENOL) 650 MG CR tablet, Take 1,300 mg by mouth every 8 (eight) hours as needed for pain., Disp: , Rfl:    brimonidine (ALPHAGAN) 0.2 % ophthalmic solution, INSTILL 1 DROP INTO BOTH  EYES TWO TIMES DAILY, Disp: 30 mL, Rfl: 3   dorzolamide-timolol (COSOPT) 22.3-6.8 MG/ML ophthalmic solution, Place 1 drop into both eyes 2 (two) times daily., Disp: , Rfl:    ibuprofen (ADVIL) 200 MG tablet, Take 400 mg by mouth every 8 (eight) hours as needed for headache or mild pain., Disp: , Rfl:    latanoprost (XALATAN) 0.005 % ophthalmic solution, Place 1 drop into both eyes at bedtime., Disp: , Rfl:    polyvinyl alcohol (LIQUIFILM TEARS) 1.4 % ophthalmic solution, Place 1 drop into both eyes every 4 (four) hours as needed for dry eyes., Disp: , Rfl: :  No Known Allergies:   Family History  Problem Relation Age of Onset   Emphysema Mother    Cancer Father   :   Social History   Socioeconomic History   Marital status: Widowed    Spouse name: Not on file   Number of children: Not on file   Years  of education: Not on file   Highest education level: Not on file  Occupational History    Comment: retired Editor, commissioning replacement  Tobacco Use   Smoking status: Never   Smokeless tobacco: Never  Substance and Sexual Activity   Alcohol use: Yes    Alcohol/week: 1.0 standard drink    Types: 1 Glasses of wine per week   Drug use: No   Sexual activity: Not on file  Other Topics Concern   Not on file  Social History Narrative   Not on file   Social Determinants of Health   Financial Resource Strain: Not on file  Food Insecurity: Not on file  Transportation Needs: Not on file  Physical Activity: Not on file  Stress: Not on file  Social Connections: Not on file  Intimate Partner Violence: Not on file   :  Pertinent items are noted in HPI.  Exam: ECOG General appearance: alert and cooperative appeared without distress. Head: atraumatic without any abnormalities. Eyes: conjunctivae/corneas clear. PERRL.  Sclera anicteric. Throat: lips, mucosa, and tongue normal; without oral thrush or ulcers. Resp: clear to auscultation bilaterally without rhonchi, wheezes or dullness to percussion. Cardio: regular rate and rhythm, S1, S2 normal, no murmur, click, rub or gallop GI: soft, non-tender; bowel sounds normal; no masses,  no organomegaly Skin: Left neck nodular lesion noted. Lymph nodes: Cervical, supraclavicular, and axillary nodes normal. Neurologic: Grossly normal without any motor, sensory or deep tendon reflexes. Musculoskeletal: No joint deformity or effusion.   CT SOFT TISSUE NECK W CONTRAST  Result Date: 01/20/2021 CLINICAL DATA:  Squamous cell carcinoma of left ear, concern for metastatic lymphadenopathy EXAM: CT NECK WITH CONTRAST TECHNIQUE: Multidetector CT imaging of the neck was performed using the standard protocol following the bolus administration of intravenous contrast. CONTRAST:  67mL ISOVUE-300 IOPAMIDOL (ISOVUE-300) INJECTION 61% COMPARISON:  None. FINDINGS: Pharynx and larynx: There is a polypoid lesion in the left nasal cavity arising from the middle turbinate extending into the nasopharynx. The nasal cavity and nasopharynx are otherwise unremarkable. The oral cavity and oropharynx are unremarkable. The hypopharynx and larynx are unremarkable. Salivary glands: The parotid and submandibular glands are unremarkable. Thyroid: Unremarkable. Lymph nodes: There is a 1.6 cm x 1.9 cm by 1.9 cm necrotic left level IIA lymph node just inferior to the parotid gland underlying the marker corresponding to one of the palpable nodules. There is an additional 0.8 cm by 1.4 cm by 1.7 cm left level IIA lymph node just posteromedial to the larger lymph node abutting the anterior margin of the  internal jugular vein which also demonstrates an area of suspected internal necrosis (3-47). There is a 0.8 cm by 0.9 cm by 1.0 cm partially necrotic left level III/IV lymph node (3-64). There are a few prominent left supraclavicular lymph nodes measuring up to 0.8 cm x 1.1 cm (3-94). There is no pathologic lymphadenopathy on the right. Vascular: There is calcified atherosclerotic plaque of the bilateral carotid bulbs without evidence of hemodynamically significant stenosis. The bilateral internal jugular veins are patent. Limited intracranial: The imaged portions of the intracranial compartment are unremarkable. Visualized orbits: Bilateral lens implants are noted. The globes and orbits are otherwise unremarkable. Mastoids and visualized paranasal sinuses: There is moderate mucosal thickening in the ethmoid air cells. The mastoid air cells are clear. Skeleton: There is multilevel degenerative change of the cervical spine. There is no acute osseous abnormality or aggressive osseous lesion. Upper chest: The lung apices are clear. Other: There is a 0.9 cm by 0.6  cm by 1.0 cm heterogeneously enhancing cutaneous lesion overlying the left zygomatic arch corresponding to the marker likely reflecting the reported clinically evident squamous cell carcinoma. IMPRESSION: 1. 1.0 cm enhancing cutaneous lesion overlying the left zygomatic arch likely corresponding to the reported clinically evident squamous cell carcinoma. 2. Necrotic left level II and III/IV lymph nodes, the largest measuring up to 1.6 cm x 1.9 cm consistent with metastatic adenopathy. Prominent supraclavicular lymph nodes are technically indeterminate but suspicious. 3. Polypoid lesion in the left nasal cavity arising from the middle terminate. Recommend correlation with direct visualization. Electronically Signed   By: Valetta Mole M.D.   On: 01/20/2021 14:28    Assessment and Plan:   87 year old with:  1.  Squamous cell carcinoma involving a cutaneous  lesion on the left lateral neck confirmed in October 2022.  This appears to be metastatic lesion from a skin primary likely either from the scalp or the left superior helix.  CT scan of the neck showed 1.0 cm cutaneous lesion with level II and level III/IV lymph nodes measuring 1.6 x 1.9 cm.  Management options at this time and the differential diagnosis was reviewed.  This appears to be advanced squamous cell carcinoma of the skin with metastatic lesions.  Management options at this time include surgical resection followed by adjuvant radiation therapy as well as the option of systemic therapy utilizing immune checkpoint inhibitor Libtayo.  The plan at this time to proceed with surgical resection and we will follow-up on the results of the pathology.  We will obtain PET scan to complete his staging and that we will decide on best course of action.  Risks and benefits of immune checkpoint inhibitors in general were discussed.  Complications that include nausea, fatigue, immune mediated issues such as pneumonitis, colitis and thyroid disease were reiterated.  He would be a reasonable candidate for all or any of these therapies at this time.  After discussion today he is agreeable to proceed with this plan and will obtain a PET scan before a follow-up in the next 2 weeks.  2.  Follow-up: Will be in 2 weeks for repeat evaluation.  60  minutes were dedicated to this visit. The time was spent on reviewing laboratory data, imaging studies, discussing treatment options, discussing differential diagnosis and answering questions regarding future plan.      A copy of this consult has been forwarded to the requesting physician.

## 2021-01-22 ENCOUNTER — Encounter (HOSPITAL_COMMUNITY): Payer: Self-pay | Admitting: Physician Assistant

## 2021-01-22 ENCOUNTER — Encounter (HOSPITAL_COMMUNITY): Payer: Self-pay

## 2021-01-22 ENCOUNTER — Encounter (HOSPITAL_COMMUNITY)
Admission: RE | Admit: 2021-01-22 | Discharge: 2021-01-22 | Disposition: A | Discharge status: Home / Self Care | Payer: Medicare Other | Source: Ambulatory Visit | Attending: Otolaryngology | Admitting: Otolaryngology

## 2021-01-22 ENCOUNTER — Other Ambulatory Visit: Payer: Self-pay

## 2021-01-22 ENCOUNTER — Ambulatory Visit (HOSPITAL_COMMUNITY)
Admission: RE | Admit: 2021-01-22 | Discharge: 2021-01-22 | Disposition: A | Discharge status: Home / Self Care | Payer: Medicare Other | Source: Ambulatory Visit | Attending: Physician Assistant | Admitting: Physician Assistant

## 2021-01-22 VITALS — BP 151/70 | HR 64 | Temp 97.6°F | Resp 18 | Ht 70.0 in | Wt 168.7 lb

## 2021-01-22 VITALS — BP 118/90 | HR 72 | Ht 70.0 in | Wt 168.8 lb

## 2021-01-22 DIAGNOSIS — I10 Essential (primary) hypertension: Secondary | ICD-10-CM | POA: Diagnosis not present

## 2021-01-22 DIAGNOSIS — Z20822 Contact with and (suspected) exposure to covid-19: Secondary | ICD-10-CM | POA: Insufficient documentation

## 2021-01-22 DIAGNOSIS — C801 Malignant (primary) neoplasm, unspecified: Secondary | ICD-10-CM | POA: Diagnosis not present

## 2021-01-22 DIAGNOSIS — Z01818 Encounter for other preprocedural examination: Secondary | ICD-10-CM | POA: Diagnosis not present

## 2021-01-22 DIAGNOSIS — I480 Paroxysmal atrial fibrillation: Secondary | ICD-10-CM | POA: Insufficient documentation

## 2021-01-22 DIAGNOSIS — C7989 Secondary malignant neoplasm of other specified sites: Secondary | ICD-10-CM | POA: Diagnosis not present

## 2021-01-22 DIAGNOSIS — I4891 Unspecified atrial fibrillation: Secondary | ICD-10-CM | POA: Insufficient documentation

## 2021-01-22 DIAGNOSIS — D6869 Other thrombophilia: Secondary | ICD-10-CM | POA: Diagnosis not present

## 2021-01-22 HISTORY — DX: Depression, unspecified: F32.A

## 2021-01-22 HISTORY — DX: Malignant (primary) neoplasm, unspecified: C80.1

## 2021-01-22 HISTORY — DX: Unilateral inguinal hernia, without obstruction or gangrene, not specified as recurrent: K40.90

## 2021-01-22 HISTORY — DX: Essential (primary) hypertension: I10

## 2021-01-22 LAB — CBC
HCT: 43.1 % (ref 39.0–52.0)
Hemoglobin: 14.3 g/dL (ref 13.0–17.0)
MCH: 31.8 pg (ref 26.0–34.0)
MCHC: 33.2 g/dL (ref 30.0–36.0)
MCV: 95.8 fL (ref 80.0–100.0)
Platelets: 182 10*3/uL (ref 150–400)
RBC: 4.5 MIL/uL (ref 4.22–5.81)
RDW: 12.9 % (ref 11.5–15.5)
WBC: 6.9 10*3/uL (ref 4.0–10.5)
nRBC: 0 % (ref 0.0–0.2)

## 2021-01-22 LAB — BASIC METABOLIC PANEL
Anion gap: 4 — ABNORMAL LOW (ref 5–15)
BUN: 21 mg/dL (ref 8–23)
CO2: 27 mmol/L (ref 22–32)
Calcium: 9.3 mg/dL (ref 8.9–10.3)
Chloride: 107 mmol/L (ref 98–111)
Creatinine, Ser: 1.06 mg/dL (ref 0.61–1.24)
GFR, Estimated: >60 mL/min (ref >60)
Glucose, Bld: 105 mg/dL — ABNORMAL HIGH (ref 70–99)
Potassium: 4.7 mmol/L (ref 3.5–5.1)
Sodium: 138 mmol/L (ref 135–145)

## 2021-01-22 LAB — SARS CORONAVIRUS 2 (TAT 6-24 HRS): SARS Coronavirus 2: NEGATIVE

## 2021-01-22 NOTE — Pre-Procedure Instructions (Signed)
Surgical Instructions    Your procedure is scheduled on Monday 01/26/21.   Report to [facility] Main Entrance "A" at 06:30 A.M., then check in with the Admitting office.  Call this number if you have problems the morning of surgery:  [phone]   If you have any questions prior to your surgery date call [phone]: Open Monday-Friday 8am-4pm    Remember:  Do not eat or drink after midnight the night before your surgery   Take these medicines the morning of surgery with A SIP OF WATER   brimonidine (ALPHAGAN) 0.2 % ophthalmic solution  dorzolamide-timolol (COSOPT)   acetaminophen (TYLENOL)- If needed  polyvinyl alcohol (LIQUIFILM TEARS)- If needed   As of today, STOP taking any Aspirin (unless otherwise instructed by your surgeon) Aleve, Naproxen, Ibuprofen, Motrin, Advil, Goody's, BC's, all herbal medications, fish oil, and all vitamins.   After your COVID test   You are not required to quarantine however you are required to wear a well-fitting mask when you are out and around people not in your household.  If your mask becomes wet or soiled, replace with a new one.  Wash your hands often with soap and water for 20 seconds or clean your hands with an alcohol-based hand sanitizer that contains at least 60% alcohol.  Do not share personal items.  Notify your provider: if you are in close contact with someone who has COVID  or if you develop a fever of 100.4 or greater, sneezing, cough, sore throat, shortness of breath or body aches.             Do not wear jewelry or makeup Do not wear lotions, powders, perfumes/colognes, or deodorant. Do not shave 48 hours prior to surgery.  Men may shave face and neck. Do not bring valuables to the hospital. DO Not wear nail polish, gel polish, artificial nails, or any other type of covering on natural nails including finger and toenails. If patients have artificial nails, gel coating, etc. that need to be removed by a nail salon,  please have this removed prior to surgery or surgery may need to be canceled/delayed if the surgeon/ anesthesia feels like the patient is unable to be adequately monitored.             [facility] is not responsible for any belongings or valuables.  Do NOT Smoke (Tobacco/Vaping)  24 hours prior to your procedure  If you use a CPAP at night, you may bring your mask for your overnight stay.   Contacts, glasses, hearing aids, dentures or partials may not be worn into surgery, please bring cases for these belongings   For patients admitted to the hospital, discharge time will be determined by your treatment team.   Patients discharged the day of surgery will not be allowed to drive home, and someone needs to stay with them for 24 hours.  NO VISITORS WILL BE ALLOWED IN PRE-OP WHERE PATIENTS ARE PREPPED FOR SURGERY.  ONLY 1 SUPPORT PERSON MAY BE PRESENT IN THE WAITING ROOM WHILE YOU ARE IN SURGERY.  IF YOU ARE TO BE ADMITTED, ONCE YOU ARE IN YOUR ROOM YOU WILL BE ALLOWED TWO (2) VISITORS. 1 (ONE) VISITOR MAY STAY OVERNIGHT BUT MUST ARRIVE TO THE ROOM BY 8pm.  Minor children may have two parents present. Special consideration for safety and communication needs will be reviewed on a case by case basis.  Special instructions:    Oral Hygiene is also important to reduce your risk of infection.  Remember - BRUSH YOUR TEETH THE MORNING OF SURGERY WITH YOUR REGULAR TOOTHPASTE   [facility]- Preparing For Surgery  Before surgery, you can play an important role. Because skin is not sterile, your skin needs to be as free of germs as possible. You can reduce the number of germs on your skin by washing with CHG (chlorahexidine gluconate) Soap before surgery.  CHG is an antiseptic cleaner which kills germs and bonds with the skin to continue killing germs even after washing.     Please do not use if you have an allergy to CHG or antibacterial soaps. If your skin becomes reddened/irritated stop using the CHG.   Do not shave (including legs and underarms) for at least 48 hours prior to first CHG shower. It is OK to shave your face.  Please follow these instructions carefully.     Shower the NIGHT BEFORE SURGERY and the MORNING OF SURGERY with CHG Soap.   If you chose to wash your hair, wash your hair first as usual with your normal shampoo. After you shampoo, rinse your hair and body thoroughly to remove the shampoo.  Then [employer] and genitals (private parts) with your normal soap and rinse thoroughly to remove soap.  After that Use CHG Soap as you would any other liquid soap. You can apply CHG directly to the skin and wash gently with a scrungie or a clean washcloth.   Apply the CHG Soap to your body ONLY FROM THE NECK DOWN.  Do not use on open wounds or open sores. Avoid contact with your eyes, ears, mouth and genitals (private parts). Wash Face and genitals (private parts)  with your normal soap.   Wash thoroughly, paying special attention to the area where your surgery will be performed.  Thoroughly rinse your body with warm water from the neck down.  DO NOT shower/wash with your normal soap after using and rinsing off the CHG Soap.  Pat yourself dry with a CLEAN TOWEL.  Wear CLEAN PAJAMAS to bed the night before surgery  Place CLEAN SHEETS on your bed the night before your surgery  DO NOT SLEEP WITH PETS.   Day of Surgery:  Take a shower with CHG soap. Wear Clean/Comfortable clothing the morning of surgery Do not apply any deodorants/lotions.   Remember to brush your teeth WITH YOUR REGULAR TOOTHPASTE.   Please read over the following fact sheets that you were given.

## 2021-01-22 NOTE — Progress Notes (Signed)
Primary Care Physician: Leonard Downing, MD Primary Cardiologist: none Primary Electrophysiologist: none Referring Physician: Karoline Caldwell PA    Donald [facility] is a 87 y.o. male with a history of HTN, squamous cell carcinoma, and atrial fibrillation who presents for consultation in the [facility].  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 ECG at preop showed rate controlled afib. Patient has a CHADS2VASC score of 3. Patient reports that he was told he had an irregular rhythm "30 years ago". He can tell his pulse is irregular when he checks. ECG in 2017 showed SR. He denies any symptoms of palpitations, fatigue, SOB, CP, or dizziness.   Today, he denies symptoms of palpitations, chest pain, shortness of breath, orthopnea, PND, lower extremity edema, dizziness, presyncope, syncope, snoring, daytime somnolence, bleeding, or neurologic sequela. The patient is tolerating medications without difficulties and is otherwise without complaint today.    Atrial Fibrillation Risk Factors:  he does not have symptoms or diagnosis of sleep apnea. he does not have a history of rheumatic fever.   he has a BMI of Body mass index is 24.22 kg/m.Marland Kitchen Filed Weights   01/22/21 1522  Weight: 76.6 kg    Family History  Problem Relation Age of Onset   Emphysema Mother    Cancer Father      Atrial Fibrillation Management history:  Previous antiarrhythmic drugs: none Previous cardioversions: none Previous ablations: none CHADS2VASC score: 3 Anticoagulation history: none   Past Medical History:  Diagnosis Date   Cancer ([place])    skin top of head - squamous and basal. "Squamous on left neck"   Depression    Hypertension    Past Surgical History:  Procedure Laterality Date   CATARACT EXTRACTION     EYE SURGERY     NASAL SEPTUM  SURGERY     40 yrs ago    Current Outpatient Medications  Medication Sig Dispense Refill   acetaminophen (TYLENOL) 650 MG CR tablet Take 1,300 mg by mouth every 8 (eight) hours as needed for pain.     brimonidine (ALPHAGAN) 0.2 % ophthalmic solution INSTILL 1 DROP INTO BOTH  EYES TWO TIMES DAILY 30 mL 3   dorzolamide-timolol (COSOPT) 22.3-6.8 MG/ML ophthalmic solution Place 1 drop into both eyes 2 (two) times daily.     ibuprofen (ADVIL) 200 MG tablet Take 400 mg by mouth every 8 (eight) hours as needed for headache or mild pain.     latanoprost (XALATAN) 0.005 % ophthalmic solution Place 1 drop into both eyes at bedtime.     polyvinyl alcohol (LIQUIFILM TEARS) 1.4 % ophthalmic solution Place 1 drop into both eyes every 4 (four) hours as needed for dry eyes.     No current facility-administered medications for this encounter.    No Known Allergies  Social History   Socioeconomic History   Marital status: Widowed    Spouse name: Not on file   Number of children: Not on file   Years of education: Not on file   Highest education level: Not on file  Occupational History    Comment: retired Editor, commissioning replacement  Tobacco Use   Smoking status: Never   Smokeless tobacco: Never  Vaping Use   Vaping Use: Never used  Substance and Sexual Activity   Alcohol use: Not Currently    Alcohol/week: 1.0 standard drink    Types: 1  Glasses of wine per week   Drug use: No   Sexual activity: Not on file  Other Topics Concern   Not on file  Social History Narrative   Not on file   Social Determinants of Health   Financial Resource Strain: Not on file  Food Insecurity: Not on file  Transportation Needs: Not on file  Physical Activity: Not on file  Stress: Not on file  Social Connections: Not on file  Intimate Partner Violence: Not on file     ROS- All systems are reviewed and negative except as per the HPI above.  Physical Exam: Vitals:   01/22/21 1522  BP: 118/90  Pulse: 72   SpO2: 96%  Weight: 76.6 kg  Height: 5\' 10"  (1.778 m)    GEN- The patient is a well appearing elderly male, alert and oriented x 3 today.   Head- normocephalic, atraumatic Eyes-  Sclera clear, conjunctiva pink Ears- hearing intact Oropharynx- clear Neck- left side 2.5 cm mass Lungs- Clear to ausculation bilaterally, normal work of breathing Heart- irregular rate and rhythm, no murmurs, rubs or gallops  GI- soft, NT, ND, + BS Extremities- no clubbing, cyanosis, or edema MS- no significant deformity or atrophy Skin- no rash or lesion Psych- euthymic mood, full affect Neuro- strength and sensation are intact  Wt Readings from Last 3 Encounters:  01/22/21 76.6 kg  01/22/21 76.5 kg  01/21/21 77.1 kg    EKG from preop shows  Afib, RBBB, PVC Vent. rate 71 BPM PR interval * ms QRS duration 154 ms QT/QTcB 446/484 ms   Epic records are reviewed at length today  CHA2DS2-VASc Score = 3  The patient's score is based upon: CHF History: 0 HTN History: 1 Diabetes History: 0 Stroke History: 0 Vascular Disease History: 0 Age Score: 2 Gender Score: 0      ASSESSMENT AND PLAN: 1. New onset atrial fibrillation  The patient's CHA2DS2-VASc score is 3, indicating a 3.2% annual risk of stroke.   General education about afib provided and questions answered.  Unclear duration. Per patient, he has a long history of an irregular pulse. Given he is asymptomatic and rate controlled, would be OK to  proceed with surgery from an afib standpoint. Check echocardiogram We also discussed his stroke risk and the risks and benefits of anticoagulation. Patient very hesitant about starting. He is worried about his bleeding risk. Would not plan to start anticoagulation prior to surgery at this time. Will discuss again at his follow up visit.   2. Secondary Hypercoagulable State (ICD10:  D68.69) The patient is at significant risk for stroke/thromboembolism based upon his CHA2DS2-VASc Score of 3.   Plan  to start anticoagulation after surgery.  3. Metastatic squamous cell carcinoma  Left parotidectomy planned for 01/26/21. Plans per ENT and oncology.   4. HTN By history in chart but he is not on any medications for it. Stable   Follow up in the AF clinic in 3 weeks.    [facility] [address] [place], [place] [address] [phone] 01/22/2021 4:06 PM

## 2021-01-22 NOTE — Progress Notes (Addendum)
PCP - Dr Arelia Sneddon- has not seen in 10 years  Cardiologist - none  EP-none  Endocrine-none  Pulm-none  Chest x-ray - na  EKG - 01/22/21  Stress Test -   ECHO -   Cardiac Cath -   AICD-no PM-no LOOP-no  Dialysis-no  Sleep Study - no CPAP - no  LABS-CBC, BMP  ASA-no  ERAS-no  HA1C-no Fasting Blood Sugar - no Checks Blood Sugar __0___ times a day  Anesthesia- EKG- new afib- Karoline Caldwell, PA-C- review and saw patient. Mr. Lakins has a history of HTN, patient reports that at one time he took a medication for HTN, but stopped it himself  years ago.  Mr. Highbaugh checks blood pressure at home, patient states that blood pressure run 115/60- 102/60.  Pt denies having chest pain, sob, or fever at this time. All instructions explained to the pt, with a verbal understanding of the material. Pt agrees to go over the instructions while at home for a better understanding. Pt also instructed to self quarantine after being tested for COVID-19. The opportunity to ask questions was provided.

## 2021-01-22 NOTE — Pre-Procedure Instructions (Signed)
Surgical Instructions    Your procedure is scheduled on Monday 01/26/21.   Report to [facility] Main Entrance "A" at 06:30 A.M., then check in with the Admitting office.  Call this number if you have problems the morning of surgery:  [phone]   If you have any questions prior to your surgery date call [phone]: Open Monday-Friday 8am-4pm    Remember:  Do not eat or drink after midnight the night before your surgery   Take these medicines the morning of surgery with A SIP OF WATER   brimonidine (ALPHAGAN) 0.2 % ophthalmic solution  dorzolamide-timolol (COSOPT)   acetaminophen (TYLENOL)- If needed  polyvinyl alcohol (LIQUIFILM TEARS)- If needed   As of today, STOP taking any Aspirin (unless otherwise instructed by your surgeon) Aleve, Naproxen, Ibuprofen, Motrin, Advil, Goody's, BC's, all herbal medications, fish oil, and all vitamins.   After your COVID test   You are not required to quarantine however you are required to wear a well-fitting mask when you are out and around people not in your household.  If your mask becomes wet or soiled, replace with a new one.  Wash your hands often with soap and water for 20 seconds or clean your hands with an alcohol-based hand sanitizer that contains at least 60% alcohol.  Do not share personal items.  Notify your provider: if you are in close contact with someone who has COVID  or if you develop a fever of 100.4 or greater, sneezing, cough, sore throat, shortness of breath or body aches.             Do not wear jewelry or makeup Do not wear lotions, powders, perfumes/colognes, or deodorant. Do not shave 48 hours prior to surgery.  Men may shave face and neck. Do not bring valuables to the hospital. DO Not wear nail polish, gel polish, artificial nails, or any other type of covering on natural nails including finger and toenails. If patients have artificial nails, gel coating, etc. that need to be removed by a nail salon,  please have this removed prior to surgery or surgery may need to be canceled/delayed if the surgeon/ anesthesia feels like the patient is unable to be adequately monitored.             [facility] is not responsible for any belongings or valuables.  Do NOT Smoke (Tobacco/Vaping)  24 hours prior to your procedure  If you use a CPAP at night, you may bring your mask for your overnight stay.   Contacts, glasses, hearing aids, dentures or partials may not be worn into surgery, please bring cases for these belongings   For patients admitted to the hospital, discharge time will be determined by your treatment team.   Patients discharged the day of surgery will not be allowed to drive home, and someone needs to stay with them for 24 hours.  NO VISITORS WILL BE ALLOWED IN PRE-OP WHERE PATIENTS ARE PREPPED FOR SURGERY.  ONLY 1 SUPPORT PERSON MAY BE PRESENT IN THE WAITING ROOM WHILE YOU ARE IN SURGERY.  IF YOU ARE TO BE ADMITTED, ONCE YOU ARE IN YOUR ROOM YOU WILL BE ALLOWED TWO (2) VISITORS. 1 (ONE) VISITOR MAY STAY OVERNIGHT BUT MUST ARRIVE TO THE ROOM BY 8pm.  Minor children may have two parents present. Special consideration for safety and communication needs will be reviewed on a case by case basis.  Special instructions:    Oral Hygiene is also important to reduce your risk of infection.  Remember - BRUSH YOUR TEETH THE MORNING OF SURGERY WITH YOUR REGULAR TOOTHPASTE   [place]- Preparing For Surgery  Before surgery, you can play an important role. Because skin is not sterile, your skin needs to be as free of germs as possible. You can reduce the number of germs on your skin by washing with CHG (chlorahexidine gluconate) Soap before surgery.  CHG is an antiseptic cleaner which kills germs and bonds with the skin to continue killing germs even after washing.     Please do not use if you have an allergy to CHG or antibacterial soaps. If your skin becomes reddened/irritated stop using the CHG.   Do not shave (including legs and underarms) for at least 48 hours prior to first CHG shower. It is OK to shave your face.  Please follow these instructions carefully.     Shower the NIGHT BEFORE SURGERY and the MORNING OF SURGERY with CHG Soap.   If you chose to wash your hair, wash your hair first as usual with your normal shampoo. After you shampoo, rinse your hair and body thoroughly to remove the shampoo.  Then [employer] and genitals (private parts) with your normal soap and rinse thoroughly to remove soap.  After that Use CHG Soap as you would any other liquid soap. You can apply CHG directly to the skin and wash gently with a scrungie or a clean washcloth.   Apply the CHG Soap to your body ONLY FROM THE NECK DOWN.  Do not use on open wounds or open sores. Avoid contact with your eyes, ears, mouth and genitals (private parts). Wash Face and genitals (private parts)  with your normal soap.   Wash thoroughly, paying special attention to the area where your surgery will be performed.  Thoroughly rinse your body with warm water from the neck down.  DO NOT shower/wash with your normal soap after using and rinsing off the CHG Soap.  Pat yourself dry with a CLEAN TOWEL.  Wear CLEAN PAJAMAS to bed the night before surgery  Place CLEAN SHEETS on your bed the night before your surgery  DO NOT SLEEP WITH PETS.   Day of Surgery:  Take a shower with CHG soap. Wear Clean/Comfortable clothing the morning of surgery Do not apply any deodorants/lotions.   Remember to brush your teeth WITH YOUR REGULAR TOOTHPASTE.   Please read over the following fact sheets that you were given.

## 2021-01-23 ENCOUNTER — Telehealth: Payer: Self-pay | Admitting: Oncology

## 2021-01-23 NOTE — Anesthesia Preprocedure Evaluation (Addendum)
Anesthesia Evaluation  Patient identified by MRN, date of birth, ID band Patient awake    Reviewed: Allergy & Precautions, H&P , NPO status , Patient's Chart, lab work & pertinent test results  Airway Mallampati: II  TM Distance: >3 FB Neck ROM: Full    Dental no notable dental hx. (+) Poor Dentition, Chipped, Missing, Dental Advisory Given   Pulmonary neg pulmonary ROS,    Pulmonary exam normal breath sounds clear to auscultation       Cardiovascular hypertension, Pt. on medications Normal cardiovascular exam+ dysrhythmias Atrial Fibrillation  Rhythm:Regular Rate:Normal     Neuro/Psych PSYCHIATRIC DISORDERS Depression negative neurological ROS  negative psych ROS   GI/Hepatic negative GI ROS, Neg liver ROS,   Endo/Other  negative endocrine ROS  Renal/GU negative Renal ROS  negative genitourinary   Musculoskeletal negative musculoskeletal ROS (+)   Abdominal   Peds negative pediatric ROS (+)  Hematology negative hematology ROS (+)   Anesthesia Other Findings   Reproductive/Obstetrics negative OB ROS                           Anesthesia Physical Anesthesia Plan  ASA: 3  Anesthesia Plan: General   Post-op Pain Management: Ofirmev IV (intra-op) and Toradol IV (intra-op)   Induction: Intravenous  PONV Risk Score and Plan: 2 and Ondansetron, Dexamethasone and Treatment may vary due to age or medical condition  Airway Management Planned: Oral ETT  Additional Equipment: None  Intra-op Plan:   Post-operative Plan: Extubation in OR  Informed Consent:     Dental advisory given  Plan Discussed with: Anesthesiologist and CRNA  Anesthesia Plan Comments: (PAT note by Karoline Caldwell, PA-C: 87 year old male history of HTN, squamous cell carcinoma.  He presented to PAT for preop evaluation prior to undergoing left parotidectomy for left ear squamous carcinoma with metastasis to the left  neck.  EKG showed rate controlled A. fib.  Patient does not carry a prior diagnosis of atrial fibrillation.  However, he does state remote history of "irregular rhythm".  Only prior EKG in our system from 2017 showed sinus rhythm.  He says he was told by a physician 30+ years ago that he had an irregular rhythm but does not recall any of the details.  He does report he used to be a pretty competitive cyclist, and states he was riding at a relatively high intensity until about 10 years ago.  He notes his activity slowed down somewhat due to his age, however he denies any chest pain, shortness of breath, palpitations.  On exam he is well-appearing, appears younger than stated age.  Auscultation reveals irregularly irregular rhythm, no murmurs, lungs CTAB.  Discussed diagnosis of atrial fibrillation and need for preoperative cardiac evaluation.    Fortunately, he was able to be seen same day at 3:30 PM in the atrial fibrillation clinic.  He was evaluated by Adline Peals, PA-C.  Per note, "The patient's CHA2DS2-VAScscore is 3, indicating a3.2% annual risk of stroke.General education about afib provided and questions answered. Unclear duration. Per patient, he has a long history of an irregular pulse. Given he isasymptomatic and rate controlled, would be OK toproceed with surgeryfrom an afib standpoint."  Echocardiogram was ordered to be done at a later date, not felt to be needed prior to surgery.  Preop labs reviewed, unremarkable.  EKG 01/22/2021: Atrial fibrillation.  Ventricular rate 71.  CT soft tissue neck 01/20/2021: IMPRESSION: 1. 1.0 cm enhancing cutaneous lesion overlying the left zygomatic  arch likely corresponding to the reported clinically evident squamous cell carcinoma. 2. Necrotic left level II and III/IV lymph nodes, the largest measuring up to 1.6 cm x 1.9 cm consistent with metastatic adenopathy. Prominent supraclavicular lymph nodes are technically indeterminate but  suspicious. 3. Polypoid lesion in the left nasal cavity arising from the middle terminate. Recommend correlation with direct visualization.  )       Anesthesia Quick Evaluation

## 2021-01-23 NOTE — Progress Notes (Signed)
Anesthesia Chart Review:  87 year old male history of HTN, squamous cell carcinoma.  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  EKG showed rate controlled A. fib.  Patient does not carry a prior diagnosis of atrial fibrillation.  However, he does state remote history of "irregular rhythm".  Only prior EKG in our system from 2017 showed sinus rhythm.  He says he was told by a physician 30+ years ago that he had an irregular rhythm but does not recall any of the details.  He does report he used to be a pretty competitive cyclist, and states he was riding at a relatively high intensity until about 10 years ago.  He notes his activity slowed down somewhat due to his age, however he denies any chest pain, shortness of breath, palpitations.  On exam he is well-appearing, appears younger than stated age.  Auscultation reveals irregularly irregular rhythm, no murmurs, lungs CTAB.  Discussed diagnosis of atrial fibrillation and need for preoperative cardiac evaluation.    Fortunately, he was able to be seen same day at 3:30 PM in the atrial fibrillation clinic.  He was evaluated by Adline Peals, PA-C.  Per note, "The patient's CHA2DS2-VASc score is 3, indicating a 3.2% annual risk of stroke. General education about afib provided and questions answered.  Unclear duration. Per patient, he has a long history of an irregular pulse. Given he is asymptomatic and rate controlled, would be OK to  proceed with surgery from an afib standpoint."  Echocardiogram was ordered to be done at a later date, not felt to be needed prior to surgery.  Preop labs reviewed, unremarkable.  EKG 01/22/2021: Atrial fibrillation.  Ventricular rate 71.  CT soft tissue neck 01/20/2021: IMPRESSION: 1. 1.0 cm enhancing cutaneous lesion overlying the left zygomatic arch likely corresponding to the reported clinically evident squamous cell  carcinoma. 2. Necrotic left level II and III/IV lymph nodes, the largest measuring up to 1.6 cm x 1.9 cm consistent with metastatic adenopathy. Prominent supraclavicular lymph nodes are technically indeterminate but suspicious. 3. Polypoid lesion in the left nasal cavity arising from the middle terminate. Recommend correlation with direct visualization.    Wynonia Musty [facility] Short Stay Center/Anesthesiology Phone [phone] 01/23/2021 1:35 PM

## 2021-01-23 NOTE — Telephone Encounter (Signed)
Scheduled per 12/02 los, patient has been called and notified.

## 2021-01-26 ENCOUNTER — Other Ambulatory Visit: Payer: Self-pay

## 2021-01-26 ENCOUNTER — Inpatient Hospital Stay (HOSPITAL_COMMUNITY)
Admission: RE | Admit: 2021-01-26 | Discharge: 2021-01-28 | DRG: 141 | Disposition: A | Discharge status: Home / Self Care | Payer: Medicare Other | Attending: Otolaryngology | Admitting: Otolaryngology

## 2021-01-26 ENCOUNTER — Inpatient Hospital Stay (HOSPITAL_COMMUNITY): Payer: Medicare Other | Admitting: Anesthesiology

## 2021-01-26 ENCOUNTER — Inpatient Hospital Stay (HOSPITAL_COMMUNITY): Payer: Medicare Other | Admitting: Physician Assistant

## 2021-01-26 ENCOUNTER — Encounter (HOSPITAL_COMMUNITY): Payer: Self-pay | Admitting: Otolaryngology

## 2021-01-26 ENCOUNTER — Encounter (HOSPITAL_COMMUNITY)
Admission: RE | Disposition: A | Discharge status: Home / Self Care | Payer: Self-pay | Source: Home / Self Care | Attending: Otolaryngology

## 2021-01-26 DIAGNOSIS — C07 Malignant neoplasm of parotid gland: Secondary | ICD-10-CM | POA: Diagnosis present

## 2021-01-26 DIAGNOSIS — Z809 Family history of malignant neoplasm, unspecified: Secondary | ICD-10-CM

## 2021-01-26 DIAGNOSIS — I10 Essential (primary) hypertension: Secondary | ICD-10-CM | POA: Diagnosis present

## 2021-01-26 DIAGNOSIS — Z01818 Encounter for other preprocedural examination: Secondary | ICD-10-CM

## 2021-01-26 DIAGNOSIS — Z825 Family history of asthma and other chronic lower respiratory diseases: Secondary | ICD-10-CM

## 2021-01-26 DIAGNOSIS — Z79899 Other long term (current) drug therapy: Secondary | ICD-10-CM

## 2021-01-26 DIAGNOSIS — Z85828 Personal history of other malignant neoplasm of skin: Secondary | ICD-10-CM

## 2021-01-26 DIAGNOSIS — C770 Secondary and unspecified malignant neoplasm of lymph nodes of head, face and neck: Secondary | ICD-10-CM | POA: Diagnosis present

## 2021-01-26 DIAGNOSIS — C7989 Secondary malignant neoplasm of other specified sites: Secondary | ICD-10-CM | POA: Diagnosis present

## 2021-01-26 DIAGNOSIS — R221 Localized swelling, mass and lump, neck: Secondary | ICD-10-CM | POA: Diagnosis present

## 2021-01-26 HISTORY — PX: PAROTIDECTOMY: SHX2163

## 2021-01-26 HISTORY — PX: RADICAL NECK DISSECTION: SHX2284

## 2021-01-26 HISTORY — PX: ADJACENT TISSUE TRANSFER/TISSUE REARRANGEMENT: SHX6829

## 2021-01-26 SURGERY — EXCISION, PAROTID GLAND
Anesthesia: General | Laterality: Left

## 2021-01-26 MED ORDER — ALBUMIN HUMAN 5 % IV SOLN: INTRAVENOUS | Status: DC | PRN

## 2021-01-26 MED ORDER — PROPOFOL 10 MG/ML IV BOLUS
INTRAVENOUS | Status: AC
  Filled 2021-01-26: qty 20

## 2021-01-26 MED ORDER — LIDOCAINE-EPINEPHRINE 1 %-1:100000 IJ SOLN
INTRAMUSCULAR | Status: AC
  Filled 2021-01-26: qty 1

## 2021-01-26 MED ORDER — PHENYLEPHRINE HCL-NACL 20-0.9 MG/250ML-% IV SOLN
INTRAVENOUS | Status: DC | PRN
  Administered 2021-01-26: 50 ug/min via INTRAVENOUS

## 2021-01-26 MED ORDER — HYDROMORPHONE HCL 1 MG/ML IJ SOLN
INTRAMUSCULAR | Status: AC
  Filled 2021-01-26: qty 0.5

## 2021-01-26 MED ORDER — DEXMEDETOMIDINE (PRECEDEX) IN NS 20 MCG/5ML (4 MCG/ML) IV SYRINGE
PREFILLED_SYRINGE | INTRAVENOUS | Status: DC | PRN
  Administered 2021-01-26: 4 ug via INTRAVENOUS
  Administered 2021-01-26: 8 ug via INTRAVENOUS

## 2021-01-26 MED ORDER — ONDANSETRON HCL 4 MG/2ML IJ SOLN: 4.0000 mg | INTRAMUSCULAR | Status: DC | PRN

## 2021-01-26 MED ORDER — CEFAZOLIN SODIUM-DEXTROSE 2-4 GM/100ML-% IV SOLN
2.0000 g | INTRAVENOUS | Status: AC
  Administered 2021-01-26 (×2): 2 g via INTRAVENOUS
  Filled 2021-01-26: qty 100

## 2021-01-26 MED ORDER — ACETAMINOPHEN 325 MG PO TABS: 325.0000 mg | ORAL_TABLET | ORAL | Status: DC | PRN

## 2021-01-26 MED ORDER — SUCCINYLCHOLINE CHLORIDE 200 MG/10ML IV SOSY
PREFILLED_SYRINGE | INTRAVENOUS | Status: DC | PRN
  Administered 2021-01-26: 180 mg via INTRAVENOUS

## 2021-01-26 MED ORDER — ONDANSETRON HCL 4 MG PO TABS: 4.0000 mg | ORAL_TABLET | ORAL | Status: DC | PRN

## 2021-01-26 MED ORDER — LIDOCAINE 2% (20 MG/ML) 5 ML SYRINGE
INTRAMUSCULAR | Status: DC | PRN
  Administered 2021-01-26: 100 mg via INTRAVENOUS

## 2021-01-26 MED ORDER — MORPHINE SULFATE (PF) 2 MG/ML IV SOLN: 2.0000 mg | INTRAVENOUS | Status: DC | PRN

## 2021-01-26 MED ORDER — KCL IN DEXTROSE-NACL 20-5-0.45 MEQ/L-%-% IV SOLN
INTRAVENOUS | Status: DC
  Filled 2021-01-26 (×2): qty 1000

## 2021-01-26 MED ORDER — EPHEDRINE SULFATE-NACL 50-0.9 MG/10ML-% IV SOSY
PREFILLED_SYRINGE | INTRAVENOUS | Status: DC | PRN
  Administered 2021-01-26 (×3): 5 mg via INTRAVENOUS

## 2021-01-26 MED ORDER — DEXAMETHASONE SODIUM PHOSPHATE 10 MG/ML IJ SOLN
INTRAMUSCULAR | Status: DC | PRN
  Administered 2021-01-26: 10 mg via INTRAVENOUS

## 2021-01-26 MED ORDER — HYDROCODONE-ACETAMINOPHEN 5-325 MG PO TABS: 1.0000 | ORAL_TABLET | ORAL | Status: DC | PRN

## 2021-01-26 MED ORDER — FENTANYL CITRATE (PF) 250 MCG/5ML IJ SOLN
INTRAMUSCULAR | Status: AC
  Filled 2021-01-26: qty 5

## 2021-01-26 MED ORDER — PROPOFOL 10 MG/ML IV BOLUS
INTRAVENOUS | Status: DC | PRN
  Administered 2021-01-26: 20 mg via INTRAVENOUS
  Administered 2021-01-26: 150 mg via INTRAVENOUS
  Administered 2021-01-26: 50 mg via INTRAVENOUS
  Administered 2021-01-26 (×3): 20 mg via INTRAVENOUS

## 2021-01-26 MED ORDER — DORZOLAMIDE HCL-TIMOLOL MAL 2-0.5 % OP SOLN
1.0000 [drp] | Freq: Two times a day (BID) | OPHTHALMIC | Status: DC
  Administered 2021-01-26 – 2021-01-27 (×3): 1 [drp] via OPHTHALMIC
  Filled 2021-01-26: qty 10

## 2021-01-26 MED ORDER — OXYCODONE HCL 5 MG/5ML PO SOLN: 5.0000 mg | Freq: Once | ORAL | Status: DC | PRN

## 2021-01-26 MED ORDER — LIDOCAINE 2% (20 MG/ML) 5 ML SYRINGE
INTRAMUSCULAR | Status: AC
  Filled 2021-01-26: qty 5

## 2021-01-26 MED ORDER — ACETAMINOPHEN 10 MG/ML IV SOLN
INTRAVENOUS | Status: DC | PRN
  Administered 2021-01-26: 1000 mg via INTRAVENOUS

## 2021-01-26 MED ORDER — STERILE WATER FOR INJECTION IJ SOLN
INTRAMUSCULAR | Status: DC | PRN
  Administered 2021-01-26: 1000 mL

## 2021-01-26 MED ORDER — ONDANSETRON HCL 4 MG/2ML IJ SOLN
INTRAMUSCULAR | Status: DC | PRN
  Administered 2021-01-26: 4 mg via INTRAVENOUS

## 2021-01-26 MED ORDER — ONDANSETRON HCL 4 MG/2ML IJ SOLN
INTRAMUSCULAR | Status: AC
  Filled 2021-01-26: qty 2

## 2021-01-26 MED ORDER — 0.9 % SODIUM CHLORIDE (POUR BTL) OPTIME
TOPICAL | Status: DC | PRN
  Administered 2021-01-26: 1000 mL

## 2021-01-26 MED ORDER — FENTANYL CITRATE (PF) 250 MCG/5ML IJ SOLN
INTRAMUSCULAR | Status: DC | PRN
  Administered 2021-01-26 (×2): 50 ug via INTRAVENOUS
  Administered 2021-01-26: 100 ug via INTRAVENOUS
  Administered 2021-01-26: 50 ug via INTRAVENOUS

## 2021-01-26 MED ORDER — ORAL CARE MOUTH RINSE: 15.0000 mL | Freq: Once | OROMUCOSAL | Status: AC

## 2021-01-26 MED ORDER — PHENYLEPHRINE HCL (PRESSORS) 10 MG/ML IV SOLN
INTRAVENOUS | Status: AC
  Filled 2021-01-26: qty 2

## 2021-01-26 MED ORDER — POLYVINYL ALCOHOL 1.4 % OP SOLN
1.0000 [drp] | OPHTHALMIC | Status: DC | PRN
  Filled 2021-01-26: qty 15

## 2021-01-26 MED ORDER — ONDANSETRON HCL 4 MG/2ML IJ SOLN: 4.0000 mg | Freq: Once | INTRAMUSCULAR | Status: DC | PRN

## 2021-01-26 MED ORDER — DEXAMETHASONE SODIUM PHOSPHATE 10 MG/ML IJ SOLN
INTRAMUSCULAR | Status: AC
  Filled 2021-01-26: qty 1

## 2021-01-26 MED ORDER — OXYCODONE HCL 5 MG PO TABS: 5.0000 mg | ORAL_TABLET | Freq: Once | ORAL | Status: DC | PRN

## 2021-01-26 MED ORDER — BRIMONIDINE TARTRATE 0.2 % OP SOLN
1.0000 [drp] | Freq: Two times a day (BID) | OPHTHALMIC | Status: DC
  Administered 2021-01-26 – 2021-01-27 (×3): 1 [drp] via OPHTHALMIC
  Filled 2021-01-26: qty 5

## 2021-01-26 MED ORDER — CHLORHEXIDINE GLUCONATE 0.12 % MT SOLN
15.0000 mL | Freq: Once | OROMUCOSAL | Status: AC
  Administered 2021-01-26: 15 mL via OROMUCOSAL
  Filled 2021-01-26: qty 15

## 2021-01-26 MED ORDER — EPHEDRINE 5 MG/ML INJ
INTRAVENOUS | Status: AC
  Filled 2021-01-26: qty 5

## 2021-01-26 MED ORDER — ACETAMINOPHEN 160 MG/5ML PO SOLN: 325.0000 mg | ORAL | Status: DC | PRN

## 2021-01-26 MED ORDER — BACITRACIN ZINC 500 UNIT/GM EX OINT
1.0000 "application " | TOPICAL_OINTMENT | Freq: Three times a day (TID) | CUTANEOUS | Status: DC
  Administered 2021-01-27 – 2021-01-28 (×5): 1 via TOPICAL
  Filled 2021-01-26 (×2): qty 28.4

## 2021-01-26 MED ORDER — MEPERIDINE HCL 25 MG/ML IJ SOLN: 6.2500 mg | INTRAMUSCULAR | Status: DC | PRN

## 2021-01-26 MED ORDER — PHENYLEPHRINE 40 MCG/ML (10ML) SYRINGE FOR IV PUSH (FOR BLOOD PRESSURE SUPPORT)
PREFILLED_SYRINGE | INTRAVENOUS | Status: DC | PRN
Start: 2021-01-26 — End: 2021-01-26
  Administered 2021-01-26: 120 ug via INTRAVENOUS

## 2021-01-26 MED ORDER — FENTANYL CITRATE (PF) 100 MCG/2ML IJ SOLN: 25.0000 ug | INTRAMUSCULAR | Status: DC | PRN

## 2021-01-26 MED ORDER — LACTATED RINGERS IV SOLN: INTRAVENOUS | Status: DC

## 2021-01-26 MED ORDER — PHENYLEPHRINE 40 MCG/ML (10ML) SYRINGE FOR IV PUSH (FOR BLOOD PRESSURE SUPPORT)
PREFILLED_SYRINGE | INTRAVENOUS | Status: AC
  Filled 2021-01-26: qty 10

## 2021-01-26 MED ORDER — LATANOPROST 0.005 % OP SOLN
1.0000 [drp] | Freq: Every day | OPHTHALMIC | Status: DC
  Administered 2021-01-26 – 2021-01-27 (×2): 1 [drp] via OPHTHALMIC
  Filled 2021-01-26: qty 2.5

## 2021-01-26 MED ORDER — DOUBLE ANTIBIOTIC 500-10000 UNIT/GM EX OINT
TOPICAL_OINTMENT | CUTANEOUS | Status: AC
  Filled 2021-01-26: qty 28.4

## 2021-01-26 MED ORDER — SUCCINYLCHOLINE CHLORIDE 200 MG/10ML IV SOSY
PREFILLED_SYRINGE | INTRAVENOUS | Status: AC
  Filled 2021-01-26: qty 10

## 2021-01-26 MED ORDER — LIDOCAINE-EPINEPHRINE 1 %-1:100000 IJ SOLN
INTRAMUSCULAR | Status: DC | PRN
  Administered 2021-01-26: 15 mL

## 2021-01-26 MED ORDER — HYDROMORPHONE HCL 1 MG/ML IJ SOLN
INTRAMUSCULAR | Status: DC | PRN
  Administered 2021-01-26: .5 mg via INTRAVENOUS

## 2021-01-26 SURGICAL SUPPLY — 49 items
BAG COUNTER SPONGE SURGICOUNT (BAG) ×3 IMPLANT
BLADE SURG 15 STRL LF DISP TIS (BLADE) ×2 IMPLANT
BLADE SURG 15 STRL SS (BLADE) ×1
CANISTER SUCT 3000ML PPV (MISCELLANEOUS) ×3 IMPLANT
CLEANER TIP ELECTROSURG 2X2 (MISCELLANEOUS) ×3 IMPLANT
CNTNR URN SCR LID CUP LEK RST (MISCELLANEOUS) ×4 IMPLANT
CONT SPEC 4OZ STRL OR WHT (MISCELLANEOUS) ×2
CORD BIPOLAR FORCEPS 12FT (ELECTRODE) ×3 IMPLANT
COVER SURGICAL LIGHT HANDLE (MISCELLANEOUS) ×3 IMPLANT
DRAIN JP 10F RND RADIO (DRAIN) ×3 IMPLANT
DRAIN SNY 10 ROU (WOUND CARE) ×3 IMPLANT
DRAPE INCISE 13X13 STRL (DRAPES) ×3 IMPLANT
ELECT COATED BLADE 2.86 ST (ELECTRODE) ×3 IMPLANT
ELECT PAIRED SUBDERMAL (MISCELLANEOUS) ×3
ELECT REM PT RETURN 9FT ADLT (ELECTROSURGICAL) ×3
ELECTRODE PAIRED SUBDERMAL (MISCELLANEOUS) ×2 IMPLANT
ELECTRODE REM PT RTRN 9FT ADLT (ELECTROSURGICAL) ×2 IMPLANT
EVACUATOR SILICONE 100CC (DRAIN) ×3 IMPLANT
FORCEPS BIPOLAR SPETZLER 8 1.0 (NEUROSURGERY SUPPLIES) ×3 IMPLANT
GAUZE 4X4 16PLY ~~LOC~~+RFID DBL (SPONGE) ×9 IMPLANT
GLOVE SURG ENC MOIS LTX SZ7.5 (GLOVE) ×3 IMPLANT
GOWN STRL REUS W/ TWL LRG LVL3 (GOWN DISPOSABLE) ×4 IMPLANT
GOWN STRL REUS W/TWL LRG LVL3 (GOWN DISPOSABLE) ×2
KIT BASIN OR (CUSTOM PROCEDURE TRAY) ×3 IMPLANT
KIT TURNOVER KIT B (KITS) ×3 IMPLANT
LOOP VESSEL MINI RED (MISCELLANEOUS) ×3 IMPLANT
NEEDLE HYPO 25GX1X1/2 BEV (NEEDLE) ×3 IMPLANT
NS IRRIG 1000ML POUR BTL (IV SOLUTION) ×6 IMPLANT
PAD ARMBOARD 7.5X6 YLW CONV (MISCELLANEOUS) ×6 IMPLANT
PENCIL SMOKE EVACUATOR (MISCELLANEOUS) ×3 IMPLANT
POSITIONER HEAD DONUT 9IN (MISCELLANEOUS) ×3 IMPLANT
PROBE NERVBE PRASS .33 (MISCELLANEOUS) ×3 IMPLANT
SHEARS HARMONIC 9CM CVD (BLADE) ×3 IMPLANT
SPECIMEN JAR MEDIUM (MISCELLANEOUS) ×3 IMPLANT
SPONGE INTESTINAL PEANUT (DISPOSABLE) ×3 IMPLANT
SPONGE T-LAP 18X18 ~~LOC~~+RFID (SPONGE) ×3 IMPLANT
STAPLER VISISTAT 35W (STAPLE) ×3 IMPLANT
SUT ETHILON 3 0 PS 1 (SUTURE) ×3 IMPLANT
SUT SILK 2 0 SH CR/8 (SUTURE) ×3 IMPLANT
SUT SILK 3 0 REEL (SUTURE) ×6 IMPLANT
SUT SILK 3 0 SH 30 (SUTURE) ×6 IMPLANT
SUT SILK 4 0 REEL (SUTURE) ×3 IMPLANT
SUT VIC AB 3-0 SH 27 (SUTURE) ×2
SUT VIC AB 3-0 SH 27X BRD (SUTURE) ×4 IMPLANT
SUT VIC AB 4-0 PS2 18 (SUTURE) ×3 IMPLANT
TOWEL GREEN STERILE FF (TOWEL DISPOSABLE) ×3 IMPLANT
TRAY ENT MC OR (CUSTOM PROCEDURE TRAY) ×3 IMPLANT
TRAY FOLEY MTR SLVR 14FR STAT (SET/KITS/TRAYS/PACK) ×3 IMPLANT
UNDERPAD 30X36 HEAVY ABSORB (UNDERPADS AND DIAPERS) ×3 IMPLANT

## 2021-01-26 NOTE — H&P (Signed)
Donald Hawkins is an 87 y.o. male.   Chief Complaint: Left neck metastatic squamous cell carcinoma HPI: 87 year old male with history of left external ear squamous cell carcinoma with new onset left upper neck mass.  Open biopsy demonstrated squamous cell carcinoma.  He has an additional small mass anterior to the helical root.  Somewhat enlarged nodes were demonstrated on CT down to zone 4.  Past Medical History:  Diagnosis Date   Cancer ([place])    skin top of head - squamous and basal. "Squamous on left neck"   Depression    Hernia, inguinal    Hypertension     Past Surgical History:  Procedure Laterality Date   CATARACT EXTRACTION     EYE SURGERY     NASAL SEPTUM SURGERY     40 yrs ago    Family History  Problem Relation Age of Onset   Emphysema Mother    Cancer Father    Social History:  reports that he has never smoked. He has never used smokeless tobacco. He reports that he does not currently use alcohol after a past usage of about 1.0 standard drink per week. He reports that he does not use drugs.  Allergies: No Known Allergies  Medications Prior to Admission  Medication Sig Dispense Refill   acetaminophen (TYLENOL) 650 MG CR tablet Take 1,300 mg by mouth every 8 (eight) hours as needed for pain.     brimonidine (ALPHAGAN) 0.2 % ophthalmic solution INSTILL 1 DROP INTO BOTH  EYES TWO TIMES DAILY 30 mL 3   dorzolamide-timolol (COSOPT) 22.3-6.8 MG/ML ophthalmic solution Place 1 drop into both eyes 2 (two) times daily.     ibuprofen (ADVIL) 200 MG tablet Take 400 mg by mouth every 8 (eight) hours as needed for headache or mild pain.     latanoprost (XALATAN) 0.005 % ophthalmic solution Place 1 drop into both eyes at bedtime.     polyvinyl alcohol (LIQUIFILM TEARS) 1.4 % ophthalmic solution Place 1 drop into both eyes every 4 (four) hours as needed for dry eyes.      No results found for this or any previous visit (from the past 48 hour(s)). No results found.  Review of  Systems  All other systems reviewed and are negative.  Blood pressure (!) 153/103, pulse 78, temperature 97.8 F (36.6 C), temperature source Oral, resp. rate 17, height 5\' 10"  (1.778 m), weight 74.8 kg, SpO2 96 %. Physical Exam Constitutional:      Appearance: Normal appearance. He is normal weight.  HENT:     Head: Normocephalic and atraumatic.     Right Ear: External ear normal.     Left Ear: External ear normal.     Nose: Nose normal.     Mouth/Throat:     Mouth: Mucous membranes are moist.     Pharynx: Oropharynx is clear.  Eyes:     Extraocular Movements: Extraocular movements intact.     Conjunctiva/sclera: Conjunctivae normal.     Pupils: Pupils are equal, round, and reactive to light.  Neck:     Comments: Firm lump anterior to right helical root and larger firm lump in left zone 2, both sites feel adherent to skin. Cardiovascular:     Rate and Rhythm: Normal rate.  Pulmonary:     Effort: Pulmonary effort is normal.  Skin:    General: Skin is warm and dry.  Neurological:     General: No focal deficit present.     Mental Status: He  is alert and oriented to person, place, and time.  Psychiatric:        Mood and Affect: Mood normal.        Behavior: Behavior normal.        Thought Content: Thought content normal.        Judgment: Judgment normal.     Assessment/Plan Left neck metastatic squamous cell carcinoma  To OR for left parotidectomy and neck dissection.  Melida Quitter, MD 01/26/2021, 8:07 AM

## 2021-01-26 NOTE — Brief Op Note (Signed)
01/26/2021  3:14 PM  PATIENT:  Donald Hawkins  87 y.o. male  PRE-OPERATIVE DIAGNOSIS:  Left neck metastatic squamous cell carcinoma  POST-OPERATIVE DIAGNOSIS:  Left neck metastatic squamous cell carcinoma  PROCEDURE:  Procedure(s): LEFT PAROTIDECTOMY (Left) MODIFIED RADICAL NECK DISSECTION (Left) Tissue rearrangement Left neck 60 sq cm.  SURGEON:  Surgeon(s) and Role:    * Redmond Baseman, Orpah Greek, MD - Primary  PHYSICIAN ASSISTANT:   ASSISTANTS: none   ANESTHESIA:   general  EBL:  80 cc   BLOOD ADMINISTERED:none  DRAINS: (10 Fr) Jackson-Pratt drain(s) with closed bulb suction in the left neck    LOCAL MEDICATIONS USED:  LIDOCAINE   SPECIMEN:  Source of Specimen:  Left lower parotid with zones 2, 3, and 4 and left upper parotid  DISPOSITION OF SPECIMEN:  PATHOLOGY  COUNTS:  YES  TOURNIQUET:  * No tourniquets in log *  DICTATION: .Note written in EPIC  PLAN OF CARE: Admit to inpatient   PATIENT DISPOSITION:  PACU - hemodynamically stable.   Delay start of Pharmacological VTE agent (>24hrs) due to surgical blood loss or risk of bleeding: no

## 2021-01-26 NOTE — Op Note (Signed)
Preop diagnosis: Left neck metastatic squamous cell carcinoma Postop diagnosis: same Procedure: Left superficial parotidectomy, left modified radical neck dissection, tissue rearrangement 60 sq cm Surgeon: Redmond Baseman Assist: None Anesth: General and local with 1% lidocaine with 1:100,000 epinephrine Compl: None Findings: Left temporal mass adherent to skin.  Left zone 2 mass adherent to skin with marginal mandibular branch firmly adherent to mass requiring sacrifice.  Several palpable enlarged lymph nodes in zones 2-4 Description:  After discussing risks, benefits, and alternatives, the patient was brought to the operative suite and placed on the operative table in the supine position.  Anesthesia was induced and the patient was intubated by the anesthesia team without difficulty.  A Foley catheter was placed.  The Nerve Integrity Monitor was placed in the left face and turned on for the case.  The left parotid and neck incision was marked with a marking pen including elliptical incisions around the two skin-adherent masses.  The incision was injected with local anesthetic.  The neck was prepped and draped in sterile fashion.  The patient was given IV antibiotics during the case.  The incision was made with a 15 blade scalpel and extended through the subcutaneous and platysma layers with Bovie electrocautery.  The external jugular vein was divided and ligated.  Subplatysmal flaps were elevated superiorly and inferiorly and flaps were sutured back.  The sternocleidomastoid muscle was skeletonized and retracted posteriorly.  The spinal accessory nerve was dissected and kept intact superiorly.  Zone 2B was dissected from deep muscle and passed under the nerve.  Dissection was then carried down to the deep cervical fascia and fatty tissue was then elevated toward the great vessels including zones 2, 3, and 4.  The omohyoid muscle was divided.  Soft tissues were then elevated off of the internal jugular vein using a  15 blade scalpel.  Thoracic duct branches were identified inferiorly and ligated.  Tissues were further dissected anteriorly and kept pedicled to the zone 2 mass.  A pre-parotid flap was elevated anteriorly and the earlobe was freed.  Skin flaps were sutured back with stay sutures.  Dissection was then performed anterior to the tragus and parotid tissue was elevated off of the mastoid.  With further dissection to the stylomastoid foramen, the main trunk of the facial nerve was identified.  The nerve was dissected to the main division and then down the inferior branches, dividing parotid tissue using bipolar electrocautery.  The marginal mandibular nerve was traced in an antegrade fashion but was found to be densely adherent to the zone 2 mass.  Being that it could not be freed form the mass, it was sacrificed.  The other lower division branches were then dissected, dividing the parotid gland.  The inferior mass was then dissected from deep and surrounding tissues and was removed attached to the neck contents.  Marking sutures were placed and the specimen was passed to nursing for pathology.  The upper division of the facial nerve was then carefully dissected and traced in an antegrade fashion, elevating the upper parotid gland toward the temporal mass.  Freeing all of the superior division branches, the temporal mass was then dissected from deep and surrounding tissues until it was removed with the upper parotid gland.   A segment of the superficial temporal artery was removed with the mass.  The specimen was passed to nursing for pathology.  The wound was then copiously irrigated with saline.  Bleeding was controlled with bipolar electrocautery.  In order to close the incision,  the skin anterior and posterior to the superior mass excision was undermined using scissors.  This totaled about 20 sq cm.  The lower defect was likewise closed by undermining anteriorly and posteriorly totaling about 40 sq cm.  A  amount of skin was removed directly anterior to the ear.  A 10 Fr round suction drain was placed and secured at the lower incision margin using 3-0 Nylon.  The wound was then closed in a simple, interrupted fashion using 3-0 Vicryl in the subcutaneous layer and staples and 5-0 Nylon in a simple, running fashion in the skin layer.  The drain was placed to bulb suction.  Bacitracin ointment was added to the incision.  Drapes were removed and the drain was secured to the shoulder with tape.  He was then returned to anesthesia for wake-up and was extubated and moved to the recovery room in stable condition.

## 2021-01-26 NOTE — Anesthesia Procedure Notes (Signed)
Procedure Name: Intubation Date/Time: 01/26/2021 8:58 AM Performed by: Erick Colace, CRNA Pre-anesthesia Checklist: Patient identified, Emergency Drugs available, Suction available and Patient being monitored Patient Re-evaluated:Patient Re-evaluated prior to induction Oxygen Delivery Method: Circle system utilized Preoxygenation: Pre-oxygenation with 100% oxygen Induction Type: IV induction Ventilation: Mask ventilation without difficulty Laryngoscope Size: Mac and 4 Grade View: Grade I Tube type: Oral Tube size: 7.5 mm Number of attempts: 1 Airway Equipment and Method: Stylet and Oral airway Placement Confirmation: ETT inserted through vocal cords under direct vision, positive ETCO2 and breath sounds checked- equal and bilateral Secured at: 23 cm Tube secured with: Tape Dental Injury: Teeth and Oropharynx as per pre-operative assessment

## 2021-01-26 NOTE — Transfer of Care (Signed)
Immediate Anesthesia Transfer of Care Note  Patient: Donald Hawkins  Procedure(s) Performed: LEFT PAROTIDECTOMY (Left) MODIFIED RADICAL NECK DISSECTION (Left) Tissue rearrangement Left neck 60 sq cm.  Patient Location: PACU  Anesthesia Type:General  Level of Consciousness: drowsy  Airway & Oxygen Therapy: Patient Spontanous Breathing and Patient connected to face mask oxygen  Post-op Assessment: Report given to RN and Post -op Vital signs reviewed and stable  Post vital signs: Reviewed and stable  Last Vitals:  Vitals Value Taken Time  BP 113/70 01/26/21 1520  Temp    Pulse 74 01/26/21 1522  Resp 19 01/26/21 1522  SpO2 98 % 01/26/21 1522  Vitals shown include unvalidated device data.  Last Pain:  Vitals:   01/26/21 0650  TempSrc:   PainSc: 0-No pain         Complications: No notable events documented.

## 2021-01-27 ENCOUNTER — Encounter (HOSPITAL_COMMUNITY): Payer: Self-pay | Admitting: Otolaryngology

## 2021-01-27 MED ORDER — CHLORHEXIDINE GLUCONATE CLOTH 2 % EX PADS
6.0000 | MEDICATED_PAD | Freq: Every day | CUTANEOUS | Status: DC
  Administered 2021-01-27: 6 via TOPICAL

## 2021-01-27 NOTE — Progress Notes (Signed)
Subjective: Doing well, feeling good.  No specific complaints.  Objective: Vital signs in last 24 hours: Temp:  [98.1 F (36.7 C)-98.3 F (36.8 C)] 98.1 F (36.7 C) (11/22 0000) Pulse Rate:  [53-123] 59 (11/22 0400) Resp:  [14-23] 19 (11/22 0400) BP: (113-157)/(68-97) 116/68 (11/22 0400) SpO2:  [96 %-99 %] 96 % (11/22 0400) Wt Readings from Last 1 Encounters:  01/26/21 74.8 kg    Intake/Output from previous day: 11/21 0701 - 11/22 0700 In: 5082.8 [P.O.:2245; I.V.:2387.8; IV Piggyback:450] Out: 550 [Urine:410; Drains:60; Blood:80] Intake/Output this shift: No intake/output data recorded.  General appearance: alert, cooperative, and no distress Neck: left incision clean and intact, no fluid collection, drain functioning, normal facial nerve function except for weak left lower lip, left shoulder shrug with some weakness  No results for input(s): WBC, HGB, HCT, PLT in the last 72 hours.  No results for input(s): NA, K, CL, CO2, GLUCOSE, BUN, CREATININE, CALCIUM in the last 72 hours.  Medications: I have reviewed the patient's current medications.  Assessment/Plan: Left neck metastatic squamous cell carcinoma  Doing very well on POD 1.  Drain functioning.  Will d/c Foley and lock IV.  Ambulate.  Possible discharge home tomorrow.   LOS: 1 day   Melida Quitter 01/27/2021, 7:51 AM

## 2021-01-27 NOTE — Anesthesia Postprocedure Evaluation (Signed)
Anesthesia Post Note  Patient: Donald Hawkins  Procedure(s) Performed: LEFT PAROTIDECTOMY (Left) MODIFIED RADICAL NECK DISSECTION (Left) Tissue rearrangement Left neck 60 sq cm.     Patient location during evaluation: PACU Anesthesia Type: General Level of consciousness: awake and alert Pain management: pain level controlled Vital Signs Assessment: post-procedure vital signs reviewed and stable Respiratory status: spontaneous breathing, nonlabored ventilation, respiratory function stable and patient connected to nasal cannula oxygen Cardiovascular status: blood pressure returned to baseline and stable Postop Assessment: no apparent nausea or vomiting Anesthetic complications: no   No notable events documented.  Last Vitals:  Vitals:   01/27/21 0000 01/27/21 0400  BP: 122/72 116/68  Pulse: (!) 53 (!) 59  Resp: 16 19  Temp: 36.7 C   SpO2: 98% 96%    Last Pain:  Vitals:   01/27/21 0000  TempSrc: Oral  PainSc:    Pain Goal:                   [name]

## 2021-01-27 NOTE — Progress Notes (Signed)
Mobility Specialist Progress Note   01/27/21 1800  Mobility  Activity Ambulated in hall  Level of Assistance Contact guard assist, steadying assist  Assistive Device  (HHA)  Distance Ambulated (ft) 480 ft  Mobility Ambulated independently in hallway  Mobility Response Tolerated well  Mobility performed by Mobility specialist  $Mobility charge 1 Mobility   Pt feels more stable w/ some sort of HHA, pt used my shoulder as a base of stability and ambulated well w/ no complaints. Returned back to bed w/ call bell by side and son in the room    Holland Falling Mobility Specialist Phone Number [phone]

## 2021-01-27 NOTE — Plan of Care (Signed)

## 2021-01-28 NOTE — Progress Notes (Signed)
Mobility Specialist Progress Note   01/28/21 1043  Mobility  Activity Refused mobility   Walked a little this morning and currently prepping for d/c home.   Holland Falling Mobility Specialist Phone Number [phone]

## 2021-01-28 NOTE — Discharge Summary (Signed)
Physician Discharge Summary  Patient ID: Donald Hawkins MRN: [id number] DOB/AGE: 05-13-1928 87 y.o.  Admit date: 01/26/2021 Discharge date: 01/28/2021  Admission Diagnoses: Metastatic squamous cell carcinoma to head and neck  Discharge Diagnoses:  Principal Problem:   Metastatic squamous cell carcinoma to head and neck [facility])   Discharged Condition: good  Hospital Course: 87 year old male with history of left external ear squamous cell carcinoma who more recently developed a mass in the left upper neck.  Open biopsy demonstrated squamous cell carcinoma.  He had another nodule arise in the left temporal region as well.  He presented for surgical management.  See operative note.  He was observed afterwards with a drain in place and progressed quite well.  On POD 2, he was felt stable for discharge.  Consults: None  Significant Diagnostic Studies: None  Treatments: surgery: Left parotidectomy, modified radical neck dissection, and tissue rearrangement.  Discharge Exam: Blood pressure (!) 156/87, pulse (!) 110, temperature 99.5 F (37.5 C), temperature source Oral, resp. rate 16, height 5\' 10"  (1.778 m), weight 74.8 kg, SpO2 96 %. General appearance: alert, cooperative, and no distress Neck: left neck incision clean and intact, no fluid collection, drain removed, left lower lip weakness, left shoulder shrug mildly weak  Disposition: Discharge disposition: 01-Home or Self Care       Discharge Instructions     Diet - low sodium heart healthy   Complete by: As directed    Discharge instructions   Complete by: As directed    Tylenol and/or ibuprofen for pain.  Apply antibiotic ointment to incision twice per day.  OK to allow incision to get wet, gently pat dry.  Avoid strenuous activity.   Discharge wound care:   Complete by: As directed    As per discharge instructions.   Increase activity slowly   Complete by: As directed       Allergies as of 01/28/2021   No Known  Allergies      Medication List     TAKE these medications    acetaminophen 650 MG CR tablet Commonly known as: TYLENOL Take 650 mg by mouth every 8 (eight) hours as needed for pain.   brimonidine 0.2 % ophthalmic solution Commonly known as: ALPHAGAN INSTILL 1 DROP INTO BOTH  EYES TWO TIMES DAILY What changed: See the new instructions.   dorzolamide-timolol 22.3-6.8 MG/ML ophthalmic solution Commonly known as: COSOPT Place 1 drop into both eyes in the morning and at bedtime.   ibuprofen 200 MG tablet Commonly known as: ADVIL Take 400 mg by mouth every 8 (eight) hours as needed for headache or mild pain.   latanoprost 0.005 % ophthalmic solution Commonly known as: XALATAN Place 1 drop into both eyes at bedtime.   polyvinyl alcohol 1.4 % ophthalmic solution Commonly known as: LIQUIFILM TEARS Place 1 drop into both eyes every 4 (four) hours as needed (for dryness).               Discharge Care Instructions  (From admission, onward)           Start     Ordered   01/28/21 0000  Discharge wound care:       Comments: As per discharge instructions.   01/28/21 0716            Follow-up Information     Melida Quitter, MD. Schedule an appointment as soon as possible for a visit in 1 week(s).   Specialty: Otolaryngology Contact information: [place]  [address] [place] [address] [phone]                 Signed: Melida Quitter 01/28/2021, 7:16 AM

## 2021-01-28 NOTE — Care Management Important Message (Signed)
Important Message  Patient Details  Name: Egypt Marchiano MRN: [id number] Date of Birth: 1928/03/16   Medicare Important Message Given:  Yes     Joetta Manners 01/28/2021, 11:01 AM

## 2021-01-28 NOTE — Plan of Care (Signed)

## 2021-01-30 LAB — SURGICAL PATHOLOGY

## 2021-02-02 ENCOUNTER — Telehealth: Payer: Self-pay | Admitting: *Deleted

## 2021-02-02 NOTE — Telephone Encounter (Signed)
Received PC from patient's son, Nicki Reaper, earlier saying the patient has a PET scan scheduled for 02/03/21, however patient has a productive cough & is concerned he may have coughing episode during the scan.  Advised Scott to rescheduled the scan - is it now scheduled for 02/12/21.  PC to [place] him patient's MD appointment needs to be rescheduled until after the PET scan so results can be discussed - scheduling will contact him regarding this change.  He verbalizes understanding, scheduling message sent.

## 2021-02-03 ENCOUNTER — Ambulatory Visit (HOSPITAL_COMMUNITY): Payer: Medicare Other

## 2021-02-04 ENCOUNTER — Telehealth: Payer: Self-pay | Admitting: Oncology

## 2021-02-04 NOTE — Telephone Encounter (Signed)
Scheduled per sch msg. Called and left msg  

## 2021-02-06 ENCOUNTER — Ambulatory Visit: Payer: Medicare Other | Admitting: Oncology

## 2021-02-12 ENCOUNTER — Encounter (HOSPITAL_COMMUNITY): Payer: Self-pay | Admitting: Physician Assistant

## 2021-02-12 ENCOUNTER — Ambulatory Visit (HOSPITAL_COMMUNITY)
Admission: RE | Admit: 2021-02-12 | Discharge: 2021-02-12 | Disposition: A | Discharge status: Home / Self Care | Payer: Medicare Other | Source: Ambulatory Visit | Attending: Physician Assistant | Admitting: Physician Assistant

## 2021-02-12 ENCOUNTER — Other Ambulatory Visit: Payer: Self-pay

## 2021-02-12 ENCOUNTER — Encounter (HOSPITAL_COMMUNITY)
Admission: RE | Admit: 2021-02-12 | Discharge: 2021-02-12 | Disposition: A | Discharge status: Home / Self Care | Payer: Medicare Other | Source: Ambulatory Visit | Attending: Oncology | Admitting: Oncology

## 2021-02-12 VITALS — BP 152/92 | HR 61 | Ht 70.0 in | Wt 165.2 lb

## 2021-02-12 DIAGNOSIS — D6869 Other thrombophilia: Secondary | ICD-10-CM | POA: Insufficient documentation

## 2021-02-12 DIAGNOSIS — I700 Atherosclerosis of aorta: Secondary | ICD-10-CM | POA: Diagnosis not present

## 2021-02-12 DIAGNOSIS — I4819 Other persistent atrial fibrillation: Secondary | ICD-10-CM | POA: Insufficient documentation

## 2021-02-12 DIAGNOSIS — I10 Essential (primary) hypertension: Secondary | ICD-10-CM | POA: Insufficient documentation

## 2021-02-12 DIAGNOSIS — D2322 Other benign neoplasm of skin of left ear and external auricular canal: Secondary | ICD-10-CM | POA: Insufficient documentation

## 2021-02-12 DIAGNOSIS — C4440 Unspecified malignant neoplasm of skin of scalp and neck: Secondary | ICD-10-CM | POA: Insufficient documentation

## 2021-02-12 LAB — GLUCOSE, CAPILLARY: Glucose-Capillary: 112 mg/dL — ABNORMAL HIGH (ref 70–99)

## 2021-02-12 MED ORDER — APIXABAN 5 MG PO TABS: 5.0000 mg | ORAL_TABLET | Freq: Two times a day (BID) | ORAL | 3 refills | Status: DC

## 2021-02-12 MED ORDER — FLUDEOXYGLUCOSE F - 18 (FDG) INJECTION
8.2000 | Freq: Once | INTRAVENOUS | Status: AC
  Administered 2021-02-12: 7.94 via INTRAVENOUS

## 2021-02-12 NOTE — Patient Instructions (Signed)
Start Eliquis 5mg twice a day 

## 2021-02-12 NOTE — Progress Notes (Signed)
Primary Care Physician: Leonard Downing, MD Primary Cardiologist: none Primary Electrophysiologist: none Referring Physician: Karoline Caldwell PA    Donald Hawkins is a 87 y.o. male with a history of HTN, squamous cell carcinoma, and atrial fibrillation who presents for consultation in the [facility].  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 ECG at preop showed rate controlled afib. Patient has a CHADS2VASC score of 3. Patient reports that he was told he had an irregular rhythm "30 years ago". He can tell his pulse is irregular when he checks. ECG in 2017 showed SR. He denies any symptoms of palpitations, fatigue, SOB, CP, or dizziness.   On follow up today, patient reports that he has recovered well form his surgery. He remains in rate controlled afib. He does feel palpitations occasionally but otherwise is unaware of his arrhythmia.   Today, he denies symptoms of chest pain, shortness of breath, orthopnea, PND, lower extremity edema, dizziness, presyncope, syncope, snoring, daytime somnolence, bleeding, or neurologic sequela. The patient is tolerating medications without difficulties and is otherwise without complaint today.    Atrial Fibrillation Risk Factors:  he does not have symptoms or diagnosis of sleep apnea. he does not have a history of rheumatic fever.   he has a BMI of Body mass index is 23.7 kg/m.Marland Kitchen Filed Weights   02/12/21 1457  Weight: 74.9 kg     Family History  Problem Relation Age of Onset   Emphysema Mother    Cancer Father      Atrial Fibrillation Management history:  Previous antiarrhythmic drugs: none Previous cardioversions: none Previous ablations: none CHADS2VASC score: 3 Anticoagulation history: none   Past Medical History:  Diagnosis Date   Cancer ([facility])    skin top of head -  squamous and basal. "Squamous on left neck"   Depression    Hernia, inguinal    Hypertension    Past Surgical History:  Procedure Laterality Date   ADJACENT TISSUE TRANSFER/TISSUE REARRANGEMENT  01/26/2021   Procedure: Tissue rearrangement Left neck 60 sq cm.;  Surgeon: Melida Quitter, MD;  Location: [facility];  Service: ENT;;   CATARACT EXTRACTION     EYE SURGERY     NASAL SEPTUM SURGERY     40 yrs ago   PAROTIDECTOMY Left 01/26/2021   Procedure: LEFT PAROTIDECTOMY;  Surgeon: Melida Quitter, MD;  Location: [facility];  Service: ENT;  Laterality: Left;   RADICAL NECK DISSECTION Left 01/26/2021   Procedure: MODIFIED RADICAL NECK DISSECTION;  Surgeon: Melida Quitter, MD;  Location: [facility];  Service: ENT;  Laterality: Left;    Current Outpatient Medications  Medication Sig Dispense Refill   acetaminophen (TYLENOL) 650 MG CR tablet Take 650 mg by mouth every 8 (eight) hours as needed for pain.     apixaban (ELIQUIS) 5 MG TABS tablet Take 1 tablet (5 mg total) by mouth 2 (two) times daily. 60 tablet 3   brimonidine (ALPHAGAN) 0.2 % ophthalmic solution INSTILL 1 DROP INTO BOTH  EYES TWO TIMES DAILY 30 mL 3   dorzolamide-timolol (COSOPT) 22.3-6.8 MG/ML ophthalmic solution Place 1 drop into both eyes in the morning and at bedtime.     ibuprofen (ADVIL) 200 MG tablet Take 400 mg by mouth every 8 (eight) hours as needed for headache or mild pain.     latanoprost (XALATAN) 0.005 % ophthalmic solution Place 1 drop  into both eyes at bedtime.     polyvinyl alcohol (LIQUIFILM TEARS) 1.4 % ophthalmic solution Place 1 drop into both eyes every 4 (four) hours as needed (for dryness).     No current facility-administered medications for this encounter.    No Known Allergies  Social History   Socioeconomic History   Marital status: Widowed    Spouse name: Not on file   Number of children: Not on file   Years of education: Not on file   Highest education level: Not on file  Occupational History    Comment:  retired Editor, commissioning replacement  Tobacco Use   Smoking status: Never   Smokeless tobacco: Never  Vaping Use   Vaping Use: Never used  Substance and Sexual Activity   Alcohol use: Not Currently    Alcohol/week: 1.0 standard drink    Types: 1 Glasses of wine per week   Drug use: No   Sexual activity: Not on file  Other Topics Concern   Not on file  Social History Narrative   Not on file   Social Determinants of Health   Financial Resource Strain: Not on file  Food Insecurity: Not on file  Transportation Needs: Not on file  Physical Activity: Not on file  Stress: Not on file  Social Connections: Not on file  Intimate Partner Violence: Not on file     ROS- All systems are reviewed and negative except as per the HPI above.  Physical Exam: Vitals:   02/12/21 1457  BP: (!) 152/92  Pulse: 61  Weight: 74.9 kg  Height: 5\' 10"  (1.778 m)    GEN- The patient is a well appearing elderly male, alert and oriented x 3 today.   HEENT-head normocephalic, atraumatic, sclera clear, conjunctiva pink, hearing intact, trachea midline. Lungs- Clear to ausculation bilaterally, normal work of breathing Heart- irregular rate and rhythm, no murmurs, rubs or gallops  GI- soft, NT, ND, + BS Extremities- no clubbing, cyanosis, or edema MS- no significant deformity or atrophy Skin- no rash or lesion Psych- euthymic mood, full affect Neuro- strength and sensation are intact   Wt Readings from Last 3 Encounters:  02/12/21 74.9 kg  02/12/21 73.5 kg  01/26/21 74.8 kg    EKG from today shows  Afib, PVC, RBBB Vent. rate 61 BPM PR interval * ms QRS duration 142 ms QT/QTcB 430/432 ms  Epic records are reviewed at length today  CHA2DS2-VASc Score = 3  The patient's score is based upon: CHF History: 0 HTN History: 1 Diabetes History: 0 Stroke History: 0 Vascular Disease History: 0 Age Score: 2 Gender Score: 0      ASSESSMENT AND PLAN: 1. Persistent atrial fibrillation  The  patient's CHA2DS2-VASc score is 3, indicating a 3.2% annual risk of stroke.   Patient remains in rate controlled afib.  Echocardiogram scheduled.  We discussed his stroke risk and the risks and benefits of anticoagulation again today. Patient agreeable to starting anticoagulation. Start Eliquis 5 mg BID (weight > 60 kg, Cr < 1.5) Can consider DCCV after 3 weeks of anticoagulation. Ultimately, given his age and paucity of symptoms, could opt for rate control if EF normal on echo.   2. Secondary Hypercoagulable State (ICD10:  D68.69) The patient is at significant risk for stroke/thromboembolism based upon his CHA2DS2-VASc Score of 3.  Start Eliquis.   3. Metastatic squamous cell carcinoma  Left parotidectomy planned for 01/26/21. Plans per ENT and oncology.   4. HTN By history in chart but he is  not on any medications for it. Well controlled at last visit, will not make changes today and reassess.    Follow up in the AF clinic in one month.    [facility] [address] [place], [place] [address] [phone] 02/12/2021 3:31 PM

## 2021-02-18 ENCOUNTER — Other Ambulatory Visit: Payer: Self-pay

## 2021-02-18 ENCOUNTER — Inpatient Hospital Stay: Payer: Medicare Other | Attending: Oncology | Admitting: Oncology

## 2021-02-18 VITALS — BP 176/85 | HR 77 | Temp 97.5°F | Resp 17 | Wt 166.9 lb

## 2021-02-18 DIAGNOSIS — I700 Atherosclerosis of aorta: Secondary | ICD-10-CM | POA: Diagnosis not present

## 2021-02-18 DIAGNOSIS — D2322 Other benign neoplasm of skin of left ear and external auricular canal: Secondary | ICD-10-CM | POA: Diagnosis not present

## 2021-02-18 DIAGNOSIS — C07 Malignant neoplasm of parotid gland: Secondary | ICD-10-CM | POA: Insufficient documentation

## 2021-02-18 DIAGNOSIS — C4440 Unspecified malignant neoplasm of skin of scalp and neck: Secondary | ICD-10-CM | POA: Diagnosis not present

## 2021-02-18 NOTE — Progress Notes (Signed)
Hematology and Oncology Follow Up Visit  Donald Hawkins [id number] October 30, 1928 87 y.o. 02/18/2021 2:11 PM Donald Hawkins, MDElkins, Donald Hawkins, *   Principle Diagnosis: 87 year old with squamous cell carcinoma of left lateral neck diagnosed in October 2022.  This indicates an isolated metastatic lesion from a primary of the ear or the scalp.  Prior Therapy:  He is status post left superficial parotidectomy and a modified radical neck dissection completed on January 26, 2021.  The final pathology showed metastatic squamous cell carcinoma involving the parotid gland and lymph nodes.  Current therapy: Under evaluation for additional therapy.  Interim History: Donald Hawkins presents today for a follow-up visit.  Since the last visit, he underwent surgical resection by Dr. Redmond Baseman.  He tolerated surgery very well without any residual complications.  His skin is healing slowly at this time.  He denies any neck pain or discomfort.  His performance status quality of life remains intact.     Medications: I have reviewed the patient's current medications.  Current Outpatient Medications  Medication Sig Dispense Refill   acetaminophen (TYLENOL) 650 MG CR tablet Take 650 mg by mouth every 8 (eight) hours as needed for pain.     apixaban (ELIQUIS) 5 MG TABS tablet Take 1 tablet (5 mg total) by mouth 2 (two) times daily. 60 tablet 3   brimonidine (ALPHAGAN) 0.2 % ophthalmic solution INSTILL 1 DROP INTO BOTH  EYES TWO TIMES DAILY 30 mL 3   dorzolamide-timolol (COSOPT) 22.3-6.8 MG/ML ophthalmic solution Place 1 drop into both eyes in the morning and at bedtime.     ibuprofen (ADVIL) 200 MG tablet Take 400 mg by mouth every 8 (eight) hours as needed for headache or mild pain.     latanoprost (XALATAN) 0.005 % ophthalmic solution Place 1 drop into both eyes at bedtime.     polyvinyl alcohol (LIQUIFILM TEARS) 1.4 % ophthalmic solution Place 1 drop into both eyes every 4 (four) hours as needed (for dryness).      No current facility-administered medications for this visit.     Allergies: No Known Allergies    Physical Exam: Blood pressure (!) 176/85, pulse 77, temperature (!) 97.5 F (36.4 C), temperature source Temporal, resp. rate 17, weight 166 lb 14.4 oz (75.7 kg), SpO2 100 %.  ECOG: 1 General appearance: alert and cooperative appeared without distress. Head/neck/skin: Well-healed incision noted on the left aspect of his cheek and neck.  Granulation tissue noted with mild erythema. Oropharynx: No oral thrush or ulcers. Eyes: No scleral icterus.  Pupils are equal and round reactive to light. Lymph nodes: Cervical, supraclavicular, and axillary nodes normal. Heart:regular rate and rhythm, S1, S2 normal, no murmur, click, rub or gallop Lung:chest clear, no wheezing, rales, normal symmetric air entry Abdomin: soft, non-tender, without masses or organomegaly. Neurological: No motor, sensory deficits.  Intact deep tendon reflexes.     Lab Results: Lab Results  Component Value Date   WBC 6.9 01/22/2021   HGB 14.3 01/22/2021   HCT 43.1 01/22/2021   MCV 95.8 01/22/2021   PLT 182 01/22/2021     Chemistry      Component Value Date/Time   NA 138 01/22/2021 1007   K 4.7 01/22/2021 1007   CL 107 01/22/2021 1007   CO2 27 01/22/2021 1007   BUN 21 01/22/2021 1007   CREATININE 1.06 01/22/2021 1007      Component Value Date/Time   CALCIUM 9.3 01/22/2021 1007      IMPRESSION: Postoperative changes about the LEFT neck  related to LEFT parotid resection resection of overlying skin lesion. Increased metabolic activity in this area more likely related to postoperative change. No signs of metastases in the neck, chest, abdomen or in the pelvis.   Aortic Atherosclerosis (ICD10-I70.0).     Impression and Plan:    87 year old with:  1.  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  He has primary diagnosis of  localized tumor of the left superior helix in 2022.   He is status post surgical resection by Dr. Redmond Baseman with the final pathology showed metastatic squamous cell carcinoma involving lymph nodes of the parotid gland.  PET CT scan did not show any locally advanced disease or distant metastasis.  Treatment options moving forward were discussed at this time.  The role for systemic therapy utilizing immunotherapy was discussed today.  Given the absence of systemic disease at this time I see no role for any additional systemic therapy which will be reserved if he has recurrent disease in the future.  The role for adjuvant radiation therapy was also discussed which would be reasonable to consider at this time to prevent the risk of local recurrence.  I will make the appropriate referral to radiation oncology for an evaluation.   2.  Follow-up: In 3 months for repeat follow-up.   30  minutes were spent on this encounter.  The time was dedicated to reviewing pathology results, imaging studies, discussing treatment choices and future plan of care review.  Donald Button, MD 12/14/20222:11 PM

## 2021-02-23 NOTE — Progress Notes (Signed)
Head and Neck Cancer Location of Tumor / Histology:  Squamous cell carcinoma of left lateral neck (diagnosed in October 2022)  Patient presented with symptoms of: history of left external ear squamous cell carcinoma who more recently developed a mass in the left upper neck.  Open biopsy demonstrated squamous cell carcinoma.  He had another nodule arise in the left temporal region as well.  He presented for surgical management  PET Scan 02/12/2021 --IMPRESSION: Postoperative changes about the LEFT neck related to LEFT parotid resection resection of overlying skin lesion. Increased metabolic activity in this area more likely related to postoperative change. No signs of metastases in the neck, chest, abdomen or in the pelvis.   Aortic Atherosclerosis CT Neck w/ Contrast 01/20/2021 --IMPRESSION: 1.0 cm enhancing cutaneous lesion overlying the left zygomatic arch likely corresponding to the reported clinically evident squamous cell carcinoma. Necrotic left level II and III/IV lymph nodes, the largest measuring up to 1.6 cm x 1.9 cm consistent with metastatic adenopathy. Prominent supraclavicular lymph nodes are technically indeterminate but suspicious. Polypoid lesion in the left nasal cavity arising from the middle terminate. Recommend correlation with direct visualization.  Biopsies revealed:  A. LYMPH NODE, LEFT LOWER PAROTID ZONES 2-4, NECK DISSECTION:  - Metastatic poorly differentiated squamous cell carcinoma with necrosis  - Metastatic carcinoma involves 10 of 10 lymph nodes in level II  - Metastatic carcinoma involves 13 of 22 lymph nodes in level III  - Metastatic carcinoma involves 12 of 22 lymph nodes in level IV   B. PAROTID, LEFT UPPER, PAROTIDECTOMY:  - Invasive poorly differentiated squamous cell carcinoma involving  parotid gland  - Resection margins are negative for carcinoma   Nutrition Status Yes No Comments  Weight changes? [x]  []    Swallowing concerns? [x]  []  Slight  pain with swallowing  PEG? []  [x]     Referrals Yes No Comments  Social Work? []  [x]    Dentistry? []  [x]    Swallowing therapy? []  [x]    Nutrition? []  [x]    Med/Onc? [x]  []  Dr. Alen Blew; systemic therapy not indicated at this time   Safety Issues Yes No Comments  Prior radiation? []  [x]    Pacemaker/ICD? []  [x]    Possible current pregnancy? []  [x]  N/A  Is the patient on methotrexate? []  [x]     Tobacco/Marijuana/Snuff/ETOH use: Patient has never smoked or used smokeless tobacco. Denies any history of recreational drug use or current alcohol consumption   Past/Anticipated interventions by otolaryngology, if any:  01/26/2021 --Dr. Melida Quitter Left superficial parotidectomy Left modified radical neck dissection Tissue rearrangement 60 sq cm  Past/Anticipated interventions by medical oncology, if any:  Under care of Dr. Zola Button 02/18/2021 --Impression and Plan He is status post surgical resection by Dr. Redmond Baseman with the final pathology showed metastatic squamous cell carcinoma involving lymph nodes of the parotid gland.   PET CT scan did not show any locally advanced disease or distant metastasis. Given the absence of systemic disease at this time I see no role for any additional systemic therapy which will be reserved if he has recurrent disease in the future. The role for adjuvant radiation therapy was also discussed which would be reasonable to consider at this time to prevent the risk of local recurrence.  I will make the appropriate referral to radiation oncology for an evaluation. --Follow-up: In 3 months for repeat follow-up.  Current Complaints / other details:  none  Vitals:   02/25/21 0757  BP: (!) 167/133  Pulse: (!) 50  Resp: 18  Temp: Marland Kitchen)  96.5 F (35.8 C)  TempSrc: Temporal  SpO2: 100%  Weight: 164 lb 2 oz (74.4 kg)  Height: 5\' 10"  (1.778 m)

## 2021-02-24 NOTE — Progress Notes (Signed)
Radiation Oncology         [phone] ________________________________  Initial Outpatient Consultation  Name: Donald Hawkins MRN: [id number]  Date: 02/25/2021  DOB: 1928-05-15  DU:KGURKY, Curt Jews, MD  Wyatt Portela, MD   REFERRING PHYSICIAN: Wyatt Portela, MD  DIAGNOSIS:    ICD-10-CM   1. Metastatic squamous cell carcinoma to head and neck (HCC)  C79.89     2. Secondary malignant neoplasm of cervical lymph node (HCC)  C77.0       Cancer Staging  Metastatic squamous cell carcinoma to head and neck (HCC) Staging form: Cutaneous Carcinoma of the Head and Neck, AJCC 8th Edition - Pathologic stage from 02/25/2021: Stage IV (pTX, pN3b, cM0) - Signed by Eppie Gibson, MD on 02/25/2021 Stage prefix: Initial diagnosis Histologic grade (G): G3 Histologic grading system: 4 grade system Presence of extranodal extension: Present Extraosseous extension: Unknown  Squamous cell carcinoma of left lateral neck diagnosed in October 2022    CHIEF COMPLAINT: Here to discuss management of skin cancer  HISTORY OF PRESENT ILLNESS::Donald Hawkins is a 87 y.o. male with a several year history of multiple skin cancers. He presents today for consideration of radiation treatment directed at new site of SCC on his left neck. The patient reports having a procedure to his left ear this past July. One month later, a lump appeared on his left neck. The patient brought this to the attention of his dermatologist who performed a biopsy of the left neck lump on 01/01/21 which revealed metastatic squamous cell carcinoma.   Accordingly, the patient presented to Dr. Redmond Baseman on 01/14/21 for further evaluation. Per Dr. Redmond Baseman visit note, the patient's left neck carcinoma likely arose from his left external ear skin cancer. Given so, Dr. Redmond Baseman recommended proceeding with neck CT to better evaluate the neck mass, followed by left parotidectomy and neck dissection, and RT.  Soft tissue neck CT on 01/20/21 revealed a  1.0 cm enhancing cutaneous lesion overlying the left zygomatic arch, noted to likely correspond with his recent Dx of squamous cell carcinoma. Additionally, necrotic left level II and III/IV lymph nodes were appreciated, the largest of which measuring up to 1.6 cm x 1.9 cm, and consistent with metastatic adenopathy. (The prominent supraclavicular lymph nodes were noted as technically indeterminate but suspicious). Lastly, a polypoid lesion in the left nasal cavity was seen, arising from the middle terminate.   Subsequently, the patient was referred to Dr. Alen Blew on 01/21/21 who recommended further workup via PET imaging for staging.  The patient opted to proceed with left parotidectomy on 01/26/21 under the care of Dr. Redmond Baseman. Pathology from the procedure revealed:  --Left lower parotid lymph node dissections of zones 2-4: metastatic poorly differentiated squamous cell carcinoma with necrosis; with metastatic carcinoma involving 10/10 level II lymph nodes, 13/22 level III lymph nodes, and 12/22 level IV lymph nodes.  --Left upper parotidectomy: invasive poorly differentiated squamous cell carcinoma involving the parotid gland. All resection margins negative for carcinoma.   On 02/05/21, the patient followed up with Dr. Redmond Baseman. During which time, the patient reported cold symptoms with significant cough which had slowly improved. Otherwise, the patient was noted to be doing well from a post-operative standpoint.   Whole body PET on 02/12/21 revealed postoperative changes about the left neck related to the left parotid resection of overlying skin lesion. Increased metabolic activity in this area was noted as more likely related to postoperative changes. No signs of metastases in the neck, chest, abdomen, or in the  pelvis were otherwise visualized.   In most recent history, the patient followed up with Dr. Alen Blew on 02/18/21. During which time, further treatment options were discussed with the patient  including the role of immunotherapy. Given the absence of systemic disease, Dr. Alen Blew did not recommend any additional systemic therapy at this time. The patient was again recommended to proceed with XRT which the patient is in agreement to.   Swallowing issues, if any: none  Weight Changes: none  Other symptoms: unable to wink w/ left eye. Notices RIGHT mouth droop since surgery. Left lower neck /SCV numbness.  Tobacco history, if any: none (never smoker)  ETOH abuse, if any: does not currently drink, reports drinking 1.0 standard drinks/week in the past  Prior cancers, if any: history of skin cancer  Dry mouth, taste altered. Hard of hearing, especially on left side  PREVIOUS RADIATION THERAPY: No  PAST MEDICAL HISTORY:  has a past medical history of Cancer ([place]), Depression, Hernia, inguinal, and Hypertension.    PAST SURGICAL HISTORY: Past Surgical History:  Procedure Laterality Date   ADJACENT TISSUE TRANSFER/TISSUE REARRANGEMENT  01/26/2021   Procedure: Tissue rearrangement Left neck 60 sq cm.;  Surgeon: Melida Quitter, MD;  Location: [facility];  Service: ENT;;   CATARACT EXTRACTION     EYE SURGERY     NASAL SEPTUM SURGERY     40 yrs ago   PAROTIDECTOMY Left 01/26/2021   Procedure: LEFT PAROTIDECTOMY;  Surgeon: Melida Quitter, MD;  Location: [facility];  Service: ENT;  Laterality: Left;   RADICAL NECK DISSECTION Left 01/26/2021   Procedure: MODIFIED RADICAL NECK DISSECTION;  Surgeon: Melida Quitter, MD;  Location: [facility];  Service: ENT;  Laterality: Left;    FAMILY HISTORY: family history includes Cancer in his father; Emphysema in his mother.  SOCIAL HISTORY:  reports that he has never smoked. He has never used smokeless tobacco. He reports that he does not currently use alcohol after a past usage of about 1.0 standard drink per week. He reports that he does not use drugs.  ALLERGIES: Patient has no known allergies.  MEDICATIONS:  Current Outpatient Medications  Medication Sig  Dispense Refill   acetaminophen (TYLENOL) 650 MG CR tablet Take 650 mg by mouth every 8 (eight) hours as needed for pain.     apixaban (ELIQUIS) 5 MG TABS tablet Take 1 tablet (5 mg total) by mouth 2 (two) times daily. 60 tablet 3   brimonidine (ALPHAGAN) 0.2 % ophthalmic solution INSTILL 1 DROP INTO BOTH  EYES TWO TIMES DAILY 30 mL 3   dorzolamide-timolol (COSOPT) 22.3-6.8 MG/ML ophthalmic solution Place 1 drop into both eyes in the morning and at bedtime.     ibuprofen (ADVIL) 200 MG tablet Take 400 mg by mouth every 8 (eight) hours as needed for headache or mild pain.     latanoprost (XALATAN) 0.005 % ophthalmic solution Place 1 drop into both eyes at bedtime.     polyvinyl alcohol (LIQUIFILM TEARS) 1.4 % ophthalmic solution Place 1 drop into both eyes every 4 (four) hours as needed (for dryness).     No current facility-administered medications for this encounter.    REVIEW OF SYSTEMS:  Notable for that above.   PHYSICAL EXAM:  height is 5\' 10"  (1.778 m) and weight is 164 lb 2 oz (74.4 kg). His temporal temperature is 96.5 F (35.8 C) (abnormal). His blood pressure is 167/133 (abnormal) and his pulse is 50 (abnormal). His respiration is 18 and oxygen saturation is 100%.  General: Alert and oriented, in no acute distress HEENT: Head is normocephalic.  Oropharynx is notable for no lesions. Slight right mouth droop.  Hard of hearing Neck: Neck is notable for no masses, satisfactory healing at left parotid/cervical surgical sites. Heart: Regular in rate and rhythm with no murmurs, rubs, or gallops. Chest: Clear to auscultation bilaterally, with no rhonchi, wheezes, or rales. Lymphatics: see Neck Exam Skin: chronic sun damage to skin.  Plaques over preaurical region on left Neurologic: See HEENT. Speech is fluent. Coordination is intact.  Psychiatric: Judgment and insight are intact. Affect is appropriate.   ECOG = 2  0 - Asymptomatic (Fully active, able to carry on all predisease  activities without restriction)  1 - Symptomatic but completely ambulatory (Restricted in physically strenuous activity but ambulatory and able to carry out work of a light or sedentary nature. For example, light housework, office work)  2 - Symptomatic, <50% in bed during the day (Ambulatory and capable of all self care but unable to carry out any work activities. Up and about more than 50% of waking hours)  3 - Symptomatic, >50% in bed, but not bedbound (Capable of only limited self-care, confined to bed or chair 50% or more of waking hours)  4 - Bedbound (Completely disabled. Cannot carry on any self-care. Totally confined to bed or chair)  5 - Death   Eustace Pen MM, Creech RH, Tormey DC, et al. [phone]). "Toxicity and response criteria of the [facility]". [place] Oncol. 5 (6): 649-55   LABORATORY DATA:  Lab Results  Component Value Date   WBC 6.9 01/22/2021   HGB 14.3 01/22/2021   HCT 43.1 01/22/2021   MCV 95.8 01/22/2021   PLT 182 01/22/2021   CMP     Component Value Date/Time   NA 138 01/22/2021 1007   K 4.7 01/22/2021 1007   CL 107 01/22/2021 1007   CO2 27 01/22/2021 1007   GLUCOSE 105 (H) 01/22/2021 1007   BUN 21 01/22/2021 1007   CREATININE 1.06 01/22/2021 1007   CALCIUM 9.3 01/22/2021 1007   GFRNONAA >60 01/22/2021 1007      No results found for: TSH   RADIOGRAPHY: NM PET Image Initial (PI) Whole Body  Result Date: 02/13/2021 CLINICAL DATA:  Initial treatment strategy for skin cancer, assess treatment response. Scalp neoplasm post LEFT parotidectomy. EXAM: NUCLEAR MEDICINE PET WHOLE BODY TECHNIQUE: 7.94 mCi F-18 FDG was injected intravenously. Full-ring PET imaging was performed from the head to foot after the radiotracer. CT data was obtained and used for attenuation correction and anatomic localization. Fasting blood glucose: 112 mg/dl COMPARISON:  CT of the neck from January 20, 2021. FINDINGS: Mediastinal blood pool activity: SUV max 2.47  HEAD/NECK: No hypermetabolic activity in the scalp. No hypermetabolic cervical lymph nodes. Incidental CT findings: Postoperative changes about the LEFT parotid bed. Maximum SUV in this location is 4.24 with blurring of associated soft tissue planes. No contralateral adenopathy or areas of increased metabolic activity outside of the operative region to suggest nodal involvement elsewhere in the neck. CHEST: No hypermetabolic mediastinal or hilar nodes. No suspicious pulmonary nodules on the CT scan. Incidental CT findings: Basilar atelectasis. No effusion. No consolidative changes. Airways are patent. Calcified atheromatous plaque of the thoracic aorta. Heart size mildly to moderately enlarged. Calcified coronary artery disease and valvular calcifications of the aortic valve. Normal caliber of central pulmonary vessels. Limited assessment of cardiovascular structures given lack of intravenous contrast. ABDOMEN/PELVIS: No abnormal hypermetabolic activity within the  liver, pancreas, adrenal glands, or spleen. No hypermetabolic lymph nodes in the abdomen or pelvis. Incidental CT findings: No acute findings relative liver, gallbladder, pancreas, spleen, adrenal glands or kidneys. The small cyst in the lower pole the LEFT kidney. Urinary bladder decompressed. No acute gastrointestinal findings. Aortic atherosclerosis without aneurysm. No adenopathy by size criteria in the abdomen or in the pelvis. SKELETON: No focal hypermetabolic activity to suggest skeletal metastasis. Incidental CT findings: Spinal degenerative changes. No acute or destructive bone process. EXTREMITIES: No abnormal hypermetabolic activity in the lower extremities. Incidental CT findings: none IMPRESSION: Postoperative changes about the LEFT neck related to LEFT parotid resection resection of overlying skin lesion. Increased metabolic activity in this area more likely related to postoperative change. No signs of metastases in the neck, chest, abdomen  or in the pelvis. Aortic Atherosclerosis (ICD10-I70.0). Electronically Signed   By: Zetta Bills M.D.   On: 02/13/2021 09:09      IMPRESSION/PLAN:  This is a delightful patient with head and neck cancer. I explained to him and his son that he has serious disease with extremely high risk of locoregional recurrence. I recommend adjuvant radiotherapy for this patient to the left parotid bed/neck.  We would restage w/ imaging 3 mo post treatment to rule out regional/local/metastatic relapse.  We discussed the potential risks, benefits, and side effects of radiotherapy. We talked in detail about acute and late effects. We discussed that some of the most bothersome acute effects may be mucositis, dysgeusia, salivary changes, skin irritation, hair loss, dehydration, weight loss and fatigue. We talked about late effects which include but are not necessarily limited to dysphagia, hypothyroidism, nerve injury, vascular injury, spinal cord injury, xerostomia, trismus, neck edema, and potential injury to any of the tissues in the head and neck region. No guarantees of treatment were given. A consent form was signed and placed in the patient's medical record. The patient is enthusiastic about proceeding with treatment. I look forward to participating in the patient's care.    Given his age and relative fragility I recommend a hypofractionated course (4 wks).  Simulation (treatment planning) will take place today - I recommend starting RT in early January to reduce risk of local relapse prior to treatment commencement.  We also discussed that the treatment of head and neck cancer is a multidisciplinary process to maximize treatment outcomes and quality of life. For this reason the following referrals have been or will be made:    Dentistry for dental evaluation, for advice on reducing risk of cavities, heal w/ dry mouth, and to address other oral issues.   Nutritionist for nutrition support during and after  treatment. Urged him to push calories/protein, and hydration for best results.   Social work for social support.    Physical therapy due to risk of lymphedema in neck and deconditioning.   Baseline labs including TSH.  On date of service, in total, I spent 60 minutes on this encounter. Patient was seen in person.  __________________________________________   Eppie Gibson, MD  This document serves as a record of services personally performed by Eppie Gibson, MD. It was created on her behalf by Roney Mans, a trained medical scribe. The creation of this record is based on the scribe's personal observations and the provider's statements to them. This document has been checked and approved by the attending provider.

## 2021-02-25 ENCOUNTER — Other Ambulatory Visit: Payer: Self-pay

## 2021-02-25 ENCOUNTER — Ambulatory Visit
Admission: RE | Admit: 2021-02-25 | Discharge: 2021-02-25 | Disposition: A | Discharge status: Home / Self Care | Payer: Medicare Other | Source: Ambulatory Visit | Attending: Radiation Oncology | Admitting: Radiation Oncology

## 2021-02-25 ENCOUNTER — Encounter: Payer: Self-pay | Admitting: Radiation Oncology

## 2021-02-25 VITALS — BP 167/133 | HR 50 | Temp 96.5°F | Resp 18 | Ht 70.0 in | Wt 164.1 lb

## 2021-02-25 DIAGNOSIS — I2510 Atherosclerotic heart disease of native coronary artery without angina pectoris: Secondary | ICD-10-CM | POA: Diagnosis not present

## 2021-02-25 DIAGNOSIS — I700 Atherosclerosis of aorta: Secondary | ICD-10-CM | POA: Insufficient documentation

## 2021-02-25 DIAGNOSIS — Z791 Long term (current) use of non-steroidal anti-inflammatories (NSAID): Secondary | ICD-10-CM | POA: Insufficient documentation

## 2021-02-25 DIAGNOSIS — I10 Essential (primary) hypertension: Secondary | ICD-10-CM | POA: Diagnosis not present

## 2021-02-25 DIAGNOSIS — R5383 Other fatigue: Secondary | ICD-10-CM

## 2021-02-25 DIAGNOSIS — Z7901 Long term (current) use of anticoagulants: Secondary | ICD-10-CM | POA: Diagnosis not present

## 2021-02-25 DIAGNOSIS — C7989 Secondary malignant neoplasm of other specified sites: Secondary | ICD-10-CM | POA: Insufficient documentation

## 2021-02-25 DIAGNOSIS — C4442 Squamous cell carcinoma of skin of scalp and neck: Secondary | ICD-10-CM | POA: Insufficient documentation

## 2021-02-25 DIAGNOSIS — F329 Major depressive disorder, single episode, unspecified: Secondary | ICD-10-CM | POA: Diagnosis not present

## 2021-02-25 DIAGNOSIS — C770 Secondary and unspecified malignant neoplasm of lymph nodes of head, face and neck: Secondary | ICD-10-CM | POA: Insufficient documentation

## 2021-02-25 NOTE — Progress Notes (Signed)
Oncology Nurse Navigator Documentation   Met with patient during initial consult with Marlaine Hind. He was accompanied by his son. I introduced myself as his/their Navigator, explained my role as a member of the Care Team. Provided New Patient Information packet: Contact information for physician, this navigator, other members of the Care Team Advance Directive information ([place] blue pamphlet with LCSW insert); provided [facility] AD booklet at his request,  Fall Prevention Patient [place] Information sheet Symptom Management Clinic information [facility] campus map with highlight of [place] SLP Information sheet Assisted with post-consult appt scheduling. I toured him to [facility] treatment area, explained procedures for lobby registration, arrival to Radiation Waiting, and arrival to treatment area.  He voiced understanding.   He completed CT simulation without difficulty.  I will place referrals for Dental evaluation, SW, nutrition, and Physical Therapy.    Harlow Asa, RN, BSN, OCN Head & Neck Oncology Nurse [place] at [place] [phone]

## 2021-02-25 NOTE — Progress Notes (Signed)
See MD note for nursing evaluation. °

## 2021-03-04 ENCOUNTER — Other Ambulatory Visit: Payer: Self-pay

## 2021-03-04 DIAGNOSIS — C7989 Secondary malignant neoplasm of other specified sites: Secondary | ICD-10-CM

## 2021-03-06 ENCOUNTER — Encounter: Payer: Self-pay | Admitting: Licensed Clinical Social Worker

## 2021-03-06 NOTE — Progress Notes (Signed)
[place] Work  Clinical Social Work was referred by S. Squire MD for assessment of psychosocial needs.  Clinical Social Worker contacted caregiver by phone, patient's son Jahzeel Poythress  to offer support and assess for needs.  Mr. Degroat stated the patient is doing well, and doe snot have any current concerns or needs.  Mr. Finlay stated he does not see a need for social work in the foreseeable future but if something comes up he'll give the CSW a call.  CSW verbalized understanding.      Adelene Amas, [place]

## 2021-03-10 DIAGNOSIS — R5383 Other fatigue: Secondary | ICD-10-CM | POA: Diagnosis present

## 2021-03-10 DIAGNOSIS — R5381 Other malaise: Secondary | ICD-10-CM | POA: Insufficient documentation

## 2021-03-10 DIAGNOSIS — C7989 Secondary malignant neoplasm of other specified sites: Secondary | ICD-10-CM | POA: Diagnosis not present

## 2021-03-11 ENCOUNTER — Ambulatory Visit
Admission: RE | Admit: 2021-03-11 | Discharge: 2021-03-11 | Disposition: A | Discharge status: Home / Self Care | Payer: Medicare Other | Source: Ambulatory Visit | Attending: Radiation Oncology | Admitting: Radiation Oncology

## 2021-03-11 ENCOUNTER — Other Ambulatory Visit: Payer: Self-pay

## 2021-03-11 ENCOUNTER — Telehealth: Payer: Self-pay | Admitting: *Deleted

## 2021-03-11 DIAGNOSIS — R5383 Other fatigue: Secondary | ICD-10-CM

## 2021-03-11 DIAGNOSIS — R5381 Other malaise: Secondary | ICD-10-CM

## 2021-03-11 DIAGNOSIS — C7989 Secondary malignant neoplasm of other specified sites: Secondary | ICD-10-CM | POA: Diagnosis not present

## 2021-03-11 NOTE — Progress Notes (Signed)
Oncology Nurse Navigator Documentation   To provide support, encouragement and care continuity, met with Donald Hawkins after his initial RT.  He was accompanied by his son. I reviewed the 2-step treatment process, answered questions.  Mr. Bernasconi completed treatment without difficulty, denied questions/concerns. I reviewed the registration/arrival procedure for subsequent treatments. I encouraged them to call me with questions/concerns as tmts proceed.   Harlow Asa RN, BSN, OCN Head & Neck Oncology Nurse [place] at [facility] Phone # [phone]  Fax # [phone]

## 2021-03-11 NOTE — Telephone Encounter (Signed)
CALLED PATIENT'S SON- WILLIAM Zito TO ASK  ABOUT BRINGING HIS DAD FOR LABS, SPOKE WITH PATIENT AND HE WILL HAVE HIS LAB TODAY @ 3:30 PM, PATIENT'S SON VERIFIED UNDERSTANDING THIS APPT.

## 2021-03-12 ENCOUNTER — Encounter: Payer: Self-pay | Admitting: Physical Therapy

## 2021-03-12 ENCOUNTER — Ambulatory Visit: Payer: Medicare Other | Attending: Radiation Oncology | Admitting: Physical Therapy

## 2021-03-12 ENCOUNTER — Ambulatory Visit
Admission: RE | Admit: 2021-03-12 | Discharge: 2021-03-12 | Disposition: A | Discharge status: Home / Self Care | Payer: Medicare Other | Source: Ambulatory Visit | Attending: Radiation Oncology | Admitting: Radiation Oncology

## 2021-03-12 DIAGNOSIS — C7989 Secondary malignant neoplasm of other specified sites: Secondary | ICD-10-CM | POA: Diagnosis not present

## 2021-03-12 DIAGNOSIS — R293 Abnormal posture: Secondary | ICD-10-CM | POA: Insufficient documentation

## 2021-03-12 DIAGNOSIS — R040 Epistaxis: Secondary | ICD-10-CM | POA: Insufficient documentation

## 2021-03-12 LAB — TSH: TSH: 1.559 u[IU]/mL (ref 0.320–4.118)

## 2021-03-12 NOTE — Therapy (Signed)
[facility] @ [address] [place], [place], [address] Phone: [phone]   Fax:  [phone]  Physical Therapy Evaluation  Patient Details  Name: Donald Hawkins MRN: [id number] Date of Birth: 1928/03/29 Referring Provider (PT): Dr. Isidore Moos   Encounter Date: 03/12/2021   PT End of Session - 03/12/21 0952     Visit Number 1    Number of Visits 2    Date for PT Re-Evaluation 04/23/21    PT Start Time 4166    PT Stop Time 1130    PT Time Calculation (min) 32 min    Activity Tolerance Patient tolerated treatment well    Behavior During Therapy [facility] for tasks assessed/performed             Past Medical History:  Diagnosis Date   Cancer ([place])    skin top of head - squamous and basal. "Squamous on left neck"   Depression    Hernia, inguinal    Hypertension     Past Surgical History:  Procedure Laterality Date   ADJACENT TISSUE TRANSFER/TISSUE REARRANGEMENT  01/26/2021   Procedure: Tissue rearrangement Left neck 60 sq cm.;  Surgeon: Melida Quitter, MD;  Location: [place];  Service: ENT;;   CATARACT EXTRACTION     EYE SURGERY     NASAL SEPTUM SURGERY     40 yrs ago   PAROTIDECTOMY Left 01/26/2021   Procedure: LEFT PAROTIDECTOMY;  Surgeon: Melida Quitter, MD;  Location: [place];  Service: ENT;  Laterality: Left;   RADICAL NECK DISSECTION Left 01/26/2021   Procedure: MODIFIED RADICAL NECK DISSECTION;  Surgeon: Melida Quitter, MD;  Location: [place];  Service: ENT;  Laterality: Left;    There were no vitals filed for this visit.    Subjective Assessment - 03/12/21 0950     Subjective I started radiation yesterday and I just had the second one today.    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 Redmond Baseman, 01/14/21 Consult with Dr. Redmond Baseman who believed the left neck carcinoma likely arose from his previous left  external ear skin caner, 01/20/21 CT neck revealed a 1.0 cm enhancing cutaneous lesion overlying the left zygomatic arch, noted to likely correspond with his recent Dx of squamous cell carcinoma. Additionally, necrotic left level II and III/IV lymph nodes were appreciated, the largest of which measuring up to 1.6 cm x 1.9 cm, and consistent with metastatic adenopathy. (The prominent supraclavicular lymph nodes were noted as technically indeterminate but suspicious), 01/26/21 Left Parotidectomy revealing Left lower parotid lymph node dissections of zones 2-4: metastatic poorly differentiated squamous cell carcinoma with necrosis; with metastatic carcinoma involving 10/10 level II lymph nodes, 13/22 level III lymph nodes, and 12/22 level IV lymph nodes. --Left upper parotidectomy: invasive poorly differentiated squamous cell carcinoma involving the parotid gland. All resection margins negative for carcinoma, 02/12/21 PET postoperative changes about the left neck related to the left parotid resection of overlying skin lesion. Increased metabolic activity in this area was noted as more likely related to postoperative changes. No signs of metastases in the neck, chest, abdomen, or in the pelvis were otherwise visualized, will receive 20 hypofractionated doses of radiation to his left parotid bed/neck. He started on 03/11/21 and will complete 04/07/21.    Patient Stated Goals to gain info from provider    Currently in Pain? No/denies    Pain  Score 0-No pain                [facility] PT Assessment - 03/12/21 0001       Assessment   Medical Diagnosis SCC left lateral neck    Referring Provider (PT) Dr. Isidore Moos    Onset Date/Surgical Date 01/01/21    Hand Dominance Right    Prior Therapy none      Precautions   Precautions Other (comment)    Precaution Comments active cancer      Restrictions   Weight Bearing Restrictions No      Balance Screen   Has the patient fallen in the past 6 months No    Has the  patient had a decrease in activity level because of a fear of falling?  No    Is the patient reluctant to leave their home because of a fear of falling?  No      Home Environment   Living Environment Private residence    Living Arrangements Alone    Available Help at Discharge Family    Type of [place]      Prior Function   Level of [place] Retired    Leisure walks on treadmill and cycles occasionally - sometimes does it everyday      Cognition   Overall Cognitive Status Within Functional Limits for tasks assessed      Functional Tests   Functional tests Sit to Stand      Sit to Stand   Comments 30 sec sit to stand: 17 reps which is above excellent for his age      Posture/Postural Control   Posture/Postural Control Postural limitations    Postural Limitations Rounded Shoulders;Forward head      ROM / Strength   AROM / PROM / Strength AROM      AROM   AROM Assessment Site Cervical    Cervical Flexion WFL    Cervical Extension WFL    Cervical - Right Side Bend WFL    Cervical - Left Side Bend WFL    Cervical - Right Rotation WFL    Cervical - Left Rotation [facility]      Ambulation/Gait   Ambulation/Gait Yes    Ambulation/Gait Assistance 7: Independent    Ambulation Distance (Feet) 10 Feet    Gait Pattern Within Functional Limits               LYMPHEDEMA/ONCOLOGY QUESTIONNAIRE - 03/12/21 0001       Lymphedema Assessments   Lymphedema Assessments Head and Neck      Head and Neck   4 cm superior to sternal notch around neck 35.1 cm    6 cm superior to sternal notch around neck 35.5 cm    8 cm superior to sternal notch around neck 35.7 cm                     Objective measurements completed on examination: See above findings.                PT Education - 03/12/21 0951     Education Details Neck ROM, importance of posture when sitting, standing and lying down, deep breathing, walking program and  importance of staying active throughout treatment, CURE article on staying active, "Why exercise?" flyer, lymphedema and PT info    Person(s) Educated Patient    Methods Explanation;Handout    Comprehension Verbalized understanding  PT Long Term Goals - 03/12/21 0953       PT LONG TERM GOAL #1   Title Pt will demonstrate baseline cervical ROM and not demonstrate any signs or symptoms of lymphedema.    Time 6    Period Weeks    Status New    Target Date 04/23/21                Head and Neck Clinic Goals - 03/12/21 0953       Patient will be able to verbalize understanding of a home exercise program for cervical range of motion, posture, and walking.          Time 1    Period Days    Status Achieved      Patient will be able to verbalize understanding of proper sitting and standing posture.          Time 1    Period Days    Status Achieved      Patient will be able to verbalize understanding of lymphedema risk and availability of treatment for this condition.          Time 1    Period Days    Status Achieved                Plan - 03/12/21 1134     Clinical Impression Statement Pt presents to PT with recentlly diagnosed squamous cell carcinoma of left lateral neck. He will be receiving radiation. Pt's cervical ROM is WFL. His gait is also [facility] but pt reports he occasionally shuffles his feet especially when first standing from a chair at home.Educated pt about signs and symptoms of lymphedema as well as anatomy and physiology of lymphatic system. Educated pt in importance of staying as active as possible throughout treatment to decrease fatigue as well as head and neck ROM exercises to decrease loss of ROM. Will see pt after completion of radiation to reassess ROM and assess for lymphedema to determine therapy needs at that time.    Stability/Clinical Decision Making Stable/Uncomplicated    Clinical Decision Making Low    PT Frequency --    eval and 1 f/u visit   PT Duration 6 weeks    PT Treatment/Interventions ADLs/Self Care Home Management;Patient/family education;Therapeutic exercise    PT Next Visit Plan reassess baselines, does he need PT for shuffling?    PT Home Exercise Plan head and neck ROM exercises    Consulted and Agree with Plan of Care Patient             Patient will benefit from skilled therapeutic intervention in order to improve the following deficits and impairments:  Postural dysfunction, Decreased knowledge of precautions  Visit Diagnosis: Metastatic squamous cell carcinoma to head and neck (HCC)  Abnormal posture     Problem List Patient Active Problem List   Diagnosis Date Noted   Secondary malignant neoplasm of cervical lymph node ([place]) 02/25/2021   Metastatic squamous cell carcinoma to head and neck ([place]) 01/26/2021   Atrial fibrillation ([place]) 01/22/2021   Secondary hypercoagulable state ([place]) 01/22/2021    Shine Scrogham Breedlove [place], PT 03/12/2021, 11:37 AM  [facility] @ [address] [place], [place], [address] Phone: [phone]   Fax:  [phone]  Name: Donald Hawkins MRN: [id number] Date of Birth: [birth year]

## 2021-03-13 ENCOUNTER — Other Ambulatory Visit: Payer: Self-pay

## 2021-03-13 ENCOUNTER — Ambulatory Visit
Admission: RE | Admit: 2021-03-13 | Discharge: 2021-03-13 | Disposition: A | Discharge status: Home / Self Care | Payer: Medicare Other | Source: Ambulatory Visit | Attending: Radiation Oncology | Admitting: Radiation Oncology

## 2021-03-13 ENCOUNTER — Other Ambulatory Visit (HOSPITAL_COMMUNITY): Payer: Medicare Other

## 2021-03-13 DIAGNOSIS — C7989 Secondary malignant neoplasm of other specified sites: Secondary | ICD-10-CM | POA: Diagnosis not present

## 2021-03-16 ENCOUNTER — Other Ambulatory Visit: Payer: Self-pay

## 2021-03-16 ENCOUNTER — Ambulatory Visit
Admission: RE | Admit: 2021-03-16 | Discharge: 2021-03-16 | Disposition: A | Discharge status: Home / Self Care | Payer: Medicare Other | Source: Ambulatory Visit | Attending: Radiation Oncology | Admitting: Radiation Oncology

## 2021-03-16 DIAGNOSIS — C7989 Secondary malignant neoplasm of other specified sites: Secondary | ICD-10-CM | POA: Diagnosis not present

## 2021-03-16 MED ORDER — SONAFINE EX EMUL
1.0000 "application " | Freq: Two times a day (BID) | CUTANEOUS | Status: DC
  Administered 2021-03-16: 1 via TOPICAL

## 2021-03-16 NOTE — Progress Notes (Signed)
Pt here for patient teaching.    Pt given Radiation and You booklet, Managing Acute Radiation Side Effects for Head and Neck Cancer handout, skin care instructions, and Sonafine.    Reviewed areas of pertinence such as fatigue, hair loss, mouth changes, skin changes, throat changes, earaches, and taste changes .   Pt able to give teach back of to pat skin, use unscented/gentle soap, and drink plenty of water,apply Sonafine bid, avoid applying anything to skin within 4 hours of treatment, and to use an electric razor if they must shave.   Pt demonstrated understanding and verbalizes understanding of information given and will contact nursing with any questions or concerns.    [email or web address]/whattoexpect/index         

## 2021-03-17 ENCOUNTER — Inpatient Hospital Stay: Payer: Medicare Other | Attending: Oncology | Admitting: Nutrition

## 2021-03-17 ENCOUNTER — Ambulatory Visit
Admission: RE | Admit: 2021-03-17 | Discharge: 2021-03-17 | Disposition: A | Discharge status: Home / Self Care | Payer: Medicare Other | Source: Ambulatory Visit | Attending: Radiation Oncology | Admitting: Radiation Oncology

## 2021-03-17 DIAGNOSIS — C7989 Secondary malignant neoplasm of other specified sites: Secondary | ICD-10-CM | POA: Diagnosis not present

## 2021-03-17 NOTE — Progress Notes (Signed)
88 year old male diagnosed with SCC of left neck.  He is followed by Dr. Alen Blew and Dr. Isidore Moos.  Plan is for 4 weeks of radiation therapy.  He is status post a left parotidectomy.  Past medical history includes hard of hearing.  Medications were reviewed.  Labs reviewed.  Height: 5 feet 10 inches. Weight: 164 pounds on December 21. Usual body weight: 170 pounds November 2022. BMI: 23.55. ECOG: 2.  Patient presents to nutrition consult with his son.  He lives alone but his family helps prepare meals for him.  He complains of some dry mouth and a little bit of difficulty swallowing bread and tough meats.  He endorses an 8 to 9 pound weight loss after his surgery.  He generally eats breakfast and dinner and has 3-4 snacks throughout the day.  He is eating based on his tolerance of foods.  Nutrition diagnosis: Food and nutrition related knowledge deficit related to cancer as evidenced by no prior need for nutrition related information.  Intervention: Educated patient to consume smaller more frequent meals and snacks with adequate calories and protein for weight maintenance. Reviewed high-protein foods and potential snacks. Recommended patient try Ensure complete or equivalent and provided samples and coupons. Education provided on strategies to improve dry mouth. Questions were answered.  Teach back method used.  Contact information given.  Patient was also provided with nutrition facts sheets.  Monitoring, evaluation, goals: Patient will tolerate adequate calories and protein to minimize weight loss.  Next visit: Patient would like for his son to contact RD with any questions or concerns.  He did not want to schedule a follow-up appointment in person.  **Disclaimer: This note was dictated with voice recognition software. Similar sounding words can inadvertently be transcribed and this note may contain transcription errors which may not have been corrected upon publication of note.**

## 2021-03-18 ENCOUNTER — Ambulatory Visit (INDEPENDENT_AMBULATORY_CARE_PROVIDER_SITE_OTHER): Payer: Medicare Other | Admitting: Dentistry

## 2021-03-18 ENCOUNTER — Encounter (HOSPITAL_COMMUNITY): Payer: Self-pay | Admitting: Dentistry

## 2021-03-18 ENCOUNTER — Ambulatory Visit
Admission: RE | Admit: 2021-03-18 | Discharge: 2021-03-18 | Disposition: A | Discharge status: Home / Self Care | Payer: Medicare Other | Source: Ambulatory Visit | Attending: Radiation Oncology | Admitting: Radiation Oncology

## 2021-03-18 ENCOUNTER — Other Ambulatory Visit: Payer: Self-pay

## 2021-03-18 VITALS — BP 151/120 | HR 64 | Temp 98.2°F

## 2021-03-18 DIAGNOSIS — C7989 Secondary malignant neoplasm of other specified sites: Secondary | ICD-10-CM

## 2021-03-18 DIAGNOSIS — C07 Malignant neoplasm of parotid gland: Secondary | ICD-10-CM

## 2021-03-18 DIAGNOSIS — K06020 Generalized gingival recession, unspecified: Secondary | ICD-10-CM

## 2021-03-18 DIAGNOSIS — K036 Deposits [accretions] on teeth: Secondary | ICD-10-CM

## 2021-03-18 DIAGNOSIS — K08109 Complete loss of teeth, unspecified cause, unspecified class: Secondary | ICD-10-CM

## 2021-03-18 DIAGNOSIS — C770 Secondary and unspecified malignant neoplasm of lymph nodes of head, face and neck: Secondary | ICD-10-CM

## 2021-03-18 DIAGNOSIS — K117 Disturbances of salivary secretion: Secondary | ICD-10-CM

## 2021-03-18 DIAGNOSIS — Z01818 Encounter for other preprocedural examination: Secondary | ICD-10-CM

## 2021-03-18 DIAGNOSIS — Z7901 Long term (current) use of anticoagulants: Secondary | ICD-10-CM

## 2021-03-18 DIAGNOSIS — K0850 Unsatisfactory restoration of tooth, unspecified: Secondary | ICD-10-CM

## 2021-03-18 NOTE — Patient Instructions (Signed)
[facility] of [facility] Dr. Debe Coder B. Benson [place], D.M.D. Phone: [phone] Fax: [phone]      It was a pleasure seeing you today!  Please refer to the information below regarding your dental visit with [place].  Please do not hesitate to give [place] a call if any questions or concerns come up after you leave.    Thank you for letting us provide care for you.  If there is anything we can do for you, please let us know.     RADIATION THERAPY AND INFORMATION REGARDING YOUR TEETH  XEROSTOMIA (dry mouth):  Your salivary glands may be in the field of radiation.  Radiation may include all or only part of your salivary glands.  This will cause your saliva to dry up, and you will have a dry mouth.  The dry mouth will be for the rest of your life unless your radiation oncologist tells you otherwise.       Your saliva has many functions: It wets your tongue for speaking. It coats your teeth and the inside of your mouth for easier movement. It helps with chewing and swallowing food. It helps clean away harmful acid and toxic products made by the germs in your mouth, therefore it helps prevent cavities. It kills some germs in your mouth and helps to prevent gum disease. It helps to carry flavor to your taste buds.       Once you have lost your saliva, you will be at higher risk for tooth decay and gum disease.         What can be done to help improve your mouth when there's not enough saliva? Your dentist may give a recommendation for CLoSYS.  It will not bring back all of your saliva but may bring back some of it.  Also, your saliva may be thick and ropy or white and foamy.  It will not feel like it use to feel. You will need to swish with water every time your mouth feels dry.  YOU CANNOT suck on any cough drops, mints, lemon drops, candy, vitamin C or any other products.  You cannot use anything other than water to make your mouth feel less dry.  If you  want to drink anything else, you have to drink it all at once and brush afterwards.  Be sure to discuss the details of your diet habits with your dentist or hygienist.   RADIATION CARIES:  This is decay (cavities) that happens very quickly  once your mouth is very dry due to radiation therapy.  Normally, cavities take six months to two years to become a problem.  When you have dry mouth, cavities may take as little as eight weeks to cause you a problem.    Dental check-ups every 2-4 months are necessary as long as you have a dry mouth.  Radiation caries typically, but not always, starts at your gum line where it is hard to see the cavity.  It is therefore also hard to fill these cavities adequately.  This high rate of cavities happens because your mouth no longer has saliva and therefore the acid made by the germs starts the decay process.  Whenever you eat anything the germs in your mouth change the food into acid.  The acid then burns a small hole in your tooth.  This small hole is the beginning of a cavity.  If this is not treated then it will grow bigger and become a  cavity.  The way to avoid this hole getting bigger is to use fluoride every evening as prescribed by your dentist following your radiation. NOTE:  You have to make sure that your teeth are very clean before you use the fluoride.  This fluoride in turn will strengthen your teeth and prepare them for another day of fighting acid. If you develop radiation caries many times, the damage is so large that you will have to have all your teeth removed.  This could be a big problem if some of these teeth are in the field of radiation.  Further details of why this could be a big problem will follow (see Osteoradionecrosis below).   DYSGEUSIA (loss of taste):  This happens to varying degrees once you've had radiation therapy to your jaw region.  Many times taste is not completely lost, but becomes limited.  The loss of taste is mostly due to radiation  affecting your taste buds.  However, if you have no saliva in your mouth to carry the flavor to your taste buds, it would be difficult for your taste buds to taste anything.  That is why using water or a prescription for [place] prior to meals and during meal times may help with some of the taste.  Keep in mind that taste generally returns very slowly over the course of several months or several years after radiation therapy.  Don't give up hope.   TRISMUS (limited jaw opening):  According to your radiation oncologist, your TMJ or jaw joints are going to be partially or fully in the field of radiation.  This means that over time the muscles that help you open and close your mouth may get stiff.  This will potentially result in your not being able to open your mouth wide enough or as wide as you can open it now.         Let me give you an example of how slowly this happens and how unaware people are of it:    A gentlemen that had radiation therapy 2 years ago went back to his dentist complaining that "bananas were just too large for him to be able to fit them in between his teeth."  He was no longer able to open wide enough to bite into a banana.  This happened slowly and over a period of time.       What we do to try and prevent this:   Your dentist will probably give you a stack of sticks called a trismus exercise device.  This stack will help remind your muscles and your jaw joints to open up to the same distance every day.  Use these sticks every morning when you wake up, or according to the instructions given by your dentist.    You must use these sticks for at least one to two years after radiation therapy.  The reason for that is because it happens so slowly and keeps going on for about two years after radiation therapy.  Your hospital dentist will help you monitor your mouth opening and make sure that it's not getting smaller after radiation.     TRISMUS EXERCISES: Using the stack of sticks given  to you by your dentist, place the stack in your mouth and hold onto the other end for support. Leave the sticks in your mouth while holding the other end.  Allow 30 seconds for muscle stretching. Rest for a few seconds. Repeat 3-5 times. This exercise is recommended in the  mornings and evenings unless otherwise instructed. The exercise should be done for a period of 2 YEARS after the end of radiation. Your maximum jaw opening should be checked regularly at recall dental visits by your general dentist. You should report any changes, soreness, or difficulties encountered when doing the exercises to your dentist.   OSTEORADIONECROSIS (ORN):  This is a condition where your jaw bone after radiation therapy becomes very dry.  It has very little blood supply to keep it alive.  If you develop a cavity that turns into an abscess or an infection, then the jaw bone does not have enough blood supply to help fight the infection.  At this point it is very likely that the infection could cause the death of your jaw bone.  When you have dead bone it has to be removed.  Therefore, you might end up having to have surgery to remove part of your jaw bone, the part of the jaw bone that has been affected.     Healing is also a problem if you are to have surgery (like a tooth extraction) in the areas where the bone has had radiation therapy.  If you have surgery, you need more blood supply to heal which is not available.  When blood supply and oxygen are not available, there is a chance for the bone to die. Occasionally, ORN happens on its own with no obvious reason, but this is quite rare.  We believe that patients who continue to smoke and/or drink alcohol have a higher chance of having this problem. Once your jaw bone has had radiation therapy, if there are any remaining teeth in that area, it is not recommended to have them pulled unless your dentist or oral surgeon is aware of your history of radiation and believes it  is safe.  The risks for ORN either from infection or spontaneously occurring (with no reason) are life long.    Questions?  Call our office during office hours at [phone].

## 2021-03-18 NOTE — Progress Notes (Signed)
Department of Dental Medicine   Service Date:   03/18/2021  Patient Name:  Donald Hawkins Date of Birth:   04/09/1928 Medical Record Number: [id number]  Referring Provider:           Eppie [place], M.D.   OUTPATIENT CONSULTATION PLAN/RECOMMENDATIONS   ASSESSMENT: There are no current signs of acute odontogenic infection including abscess, edema or erythema, or suspicious lesion requiring biopsy.   Upper left premolar missing full-coverage crown; accretions, recession; clinical xerostomia.  RECOMMENDATIONS: Try using CLoSYS for dry mouth as needed. Start doing jaw exercises as instructed for limited opening.  PLAN: Discuss case with medical team. Follow-up after completion of radiation therapy. Call if any questions or concerns arise.  Discussed in detail all treatment options and recommendations with the patient and they are agreeable to the plan.    Thank you for consulting with [facility] and for the opportunity to participate in this patient's treatment.  Should you have any questions or concerns, please contact the [facility] at [phone].       03/18/2021 CONSULT NOTE:    COVID-19 SCREENING:  The patient denies symptoms concerning for COVID-19 infection including fever, chills, cough, or newly developed shortness of breath.   HISTORY OF PRESENT ILLNESS: Donald Hawkins is a very pleasant 88 y.o. 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  He is now status-post left parotidectomy and is currently undergoing head and neck radiation (started on 1/5).   The patient presents today for a medically necessary dental consultation as part of their pre-radiation therapy work-up (for discussion and eduction purposes only).   DENTAL HISTORY: The patient reports that he has gone to the dentist every  6 months for as long as he can remember.  His primary dentist is located where he used to work which is now a bit of a drive, so he intends on finding an office closer to home once he finishes cancer treatment.  He had a dental appointment scheduled for this month because he had a crown fall off one of his teeth (upper right premolar), but had to cancel it due to his recent diagnosis.  He currently denies any dental/orofacial pain or sensitivity.   Patient endorses symptoms of dry mouth.  Patient denies dysphagia, odynophagia, dysphonia, SOB and neck pain.  Patient denies fever, rigors and malaise.   CHIEF COMPLAINT:  Here for a pre-head and neck radiation dental exam.   Patient Active Problem List   Diagnosis Date Noted   Secondary malignant neoplasm of cervical lymph node ([place]) 02/25/2021   Metastatic squamous cell carcinoma to head and neck ([place]) 01/26/2021   Atrial fibrillation ([place]) 01/22/2021   Secondary hypercoagulable state ([place]) 01/22/2021   Past Medical History:  Diagnosis Date   Cancer ([place])    skin top of head - squamous and basal. "Squamous on left neck"   Depression    Hernia, inguinal    Hypertension    Past Surgical History:  Procedure Laterality Date   ADJACENT TISSUE TRANSFER/TISSUE REARRANGEMENT  01/26/2021   Procedure: Tissue rearrangement Left neck 60 sq cm.;  Surgeon: Melida Quitter, MD;  Location: [facility];  Service: ENT;;   CATARACT EXTRACTION     EYE SURGERY     NASAL SEPTUM SURGERY     40 yrs ago   PAROTIDECTOMY Left 01/26/2021   Procedure: LEFT PAROTIDECTOMY;  Surgeon: Redmond Baseman,  Orpah Greek, MD;  Location: [facility];  Service: ENT;  Laterality: Left;   RADICAL NECK DISSECTION Left 01/26/2021   Procedure: MODIFIED RADICAL NECK DISSECTION;  Surgeon: Melida Quitter, MD;  Location: [facility];  Service: ENT;  Laterality: Left;   No Known Allergies Current Outpatient Medications  Medication Sig Dispense Refill   acetaminophen (TYLENOL) 650 MG CR tablet Take 650 mg by mouth  every 8 (eight) hours as needed for pain.     apixaban (ELIQUIS) 5 MG TABS tablet Take 1 tablet (5 mg total) by mouth 2 (two) times daily. 60 tablet 3   brimonidine (ALPHAGAN) 0.2 % ophthalmic solution INSTILL 1 DROP INTO BOTH  EYES TWO TIMES DAILY 30 mL 3   dorzolamide-timolol (COSOPT) 22.3-6.8 MG/ML ophthalmic solution Place 1 drop into both eyes in the morning and at bedtime.     ibuprofen (ADVIL) 200 MG tablet Take 400 mg by mouth every 8 (eight) hours as needed for headache or mild pain.     latanoprost (XALATAN) 0.005 % ophthalmic solution Place 1 drop into both eyes at bedtime.     polyvinyl alcohol (LIQUIFILM TEARS) 1.4 % ophthalmic solution Place 1 drop into both eyes every 4 (four) hours as needed (for dryness).     No current facility-administered medications for this visit.    LABS: Lab Results  Component Value Date   WBC 6.9 01/22/2021   HGB 14.3 01/22/2021   HCT 43.1 01/22/2021   MCV 95.8 01/22/2021   PLT 182 01/22/2021      Component Value Date/Time   NA 138 01/22/2021 1007   K 4.7 01/22/2021 1007   CL 107 01/22/2021 1007   CO2 27 01/22/2021 1007   GLUCOSE 105 (H) 01/22/2021 1007   BUN 21 01/22/2021 1007   CREATININE 1.06 01/22/2021 1007   CALCIUM 9.3 01/22/2021 1007   GFRNONAA >60 01/22/2021 1007   No results found for: INR, PROTIME No results found for: PTT  Social History   Socioeconomic History   Marital status: Widowed    Spouse name: Not on file   Number of children: Not on file   Years of education: Not on file   Highest education level: Not on file  Occupational History    Comment: retired Editor, commissioning replacement  Tobacco Use   Smoking status: Never   Smokeless tobacco: Never  Vaping Use   Vaping Use: Never used  Substance and Sexual Activity   Alcohol use: Not Currently    Alcohol/week: 1.0 standard drink    Types: 1 Glasses of wine per week   Drug use: No   Sexual activity: Not on file  Other Topics Concern   Not on file  Social  History Narrative   Not on file   Social Determinants of Health   Financial Resource Strain: Not on file  Food Insecurity: Not on file  Transportation Needs: Not on file  Physical Activity: Not on file  Stress: Not on file  Social Connections: Not on file  Intimate Partner Violence: Not on file   Family History  Problem Relation Age of Onset   Emphysema Mother    Cancer Father      REVIEW OF SYSTEMS:  Reviewed with the patient as per HPI. Psych: Patient denies having dental phobia.   VITAL SIGNS: BP (!) 151/120 (BP Location: Right Arm, Patient Position: Sitting, Cuff Size: Normal)    Pulse 64    Temp 98.2 F (36.8 C) (Oral)    PHYSICAL EXAM: General:  Well-developed, comfortable and  in no apparent distress. Neurological:  Alert and oriented to person, place and  time. Extraoral:  Facial symmetry present without any edema or erythema.  No swelling or lymphadenopathy.  TMJ asymptomatic without clicks or crepitations.     Maximum Interincisal Opening:  45 mm Intraoral:  Soft tissues appear well-perfused and mucous membranes moist.  FOM and vestibules soft and not raised. Oral cavity without mass or lesion. No signs of infection, parulis, sinus tract, edema or erythema evident upon exam. (+) Mucous membranes:  Thick, viscous saliva   DENTAL EXAM: Hard tissue exam.    Overall impression:  Fair remaining dentition.    Oral hygiene:  Fair  Periodontal:  Pink, healthy gingival tissue with blunted papilla.  Localized plaque and calculus accumulation. (+) Recession: Generalized Defective restorations:  #12 missing full-coverage crown/definitive restoration; no caries or pulpal blushing evident (likely tooth has been root canal treated).  >50% of tooth is broken off to gingival margin. Removable/fixed prosthodontics:  Patient denies wearing partial dentures.    RADIOGRAPHIC EXAM:  None taken at today's visit.   ASSESSMENT:  1.  Metastatic SCC to the head and neck 2.   Pre-head and neck radiation dental exam 3.  Long-term use of anticoagulation (on Eliquis) 4.  Missing teeth 5.  Gingival recession, generalized 6.  Accretions on teeth 7.  Defective dental restoration 8.  Clinical xerostomia 9.  Postoperative bleeding risk   PROCEDURES: The common and significant side effects of radiation therapy to the head and neck were explained and discussed with the patient.  The discussion included side effects of trismus (limited opening), dysgeusia (loss of taste), xerostomia (dry mouth), radiation caries and osteoradionecrosis of the jaw.  I also discussed the importance of maintaining optimal oral hygiene and oral health before, during and after radiation to decrease the risk of developing radiation cavities and the need for any surgery such as extractions after therapy.    Trismus appliance made using patient's baseline MIO (24 sticks).  Leta Speller, DAII demonstrated use of appliance.  Verbal and written postop instructions were given to the patient.   PLAN AND RECOMMENDATIONS: I discussed the risks, benefits, and complications of various scenarios with the patient in relationship to their medical and dental conditions, which included systemic infection or other serious issues such as osteoradionecrosis that could potentially occur either before, during or after their anticipated radiation therapy if dental/oral concerns are not addressed.  I explained that if any chronic or acute dental/oral infection(s) are addressed and subsequently not maintained following medical optimization and recovery, their risk of the previously mentioned complications are just as high and could potentially occur postoperatively.  I explained all significant findings of the dental consultation with the patient including tartar or calculus build-up and regarding upper left premolar (which lost a crown) prognosis being guarded/poor and possibly hopeless due to amount of tooth structure  remaining above his gumline,  and the recommended care including continuing to see his primary dentist or another dental provider after he finishes radiation for comprehensive care in order to optimize their oral health and address any issues/concerns.  The patient verbalized understanding of all findings, discussion, and recommendations. We then discussed various treatment options to include no treatment, multiple extractions with alveoloplasty, pre-prosthetic surgery as indicated, periodontal therapy, dental restorations, root canal therapy, crown and bridge therapy, implant therapy, and replacement of missing teeth as indicated.  The patient verbalized understanding of all options.  I explained to the patient that we will see him once more  after he finishes therapy for a follow-up appointment (scheduled this appointment today). Plan to discuss all findings and recommendations with medical team.   Recommend trying CLoSYS for dry mouth to help alleviate side effects.  Samples were given to the patient today. Call if any questions or concerns arise. The patient will need to establish care at a dental office of his choice or return to his regular dentist for routine dental care including replacement of missing teeth as needed, cleanings and exams.  All questions and concerns were invited and addressed.  The patient tolerated today's visit well and departed in stable condition.  I spent in excess of 120 minutes during the conduct of this consultation and >50% of this time involved direct face-to-face encounter for counseling and/or coordination of the patient's care.      S[place]lehouse Benson [place], D.M.D.

## 2021-03-19 ENCOUNTER — Ambulatory Visit (HOSPITAL_COMMUNITY): Payer: Medicare Other | Admitting: Physician Assistant

## 2021-03-19 ENCOUNTER — Ambulatory Visit
Admission: RE | Admit: 2021-03-19 | Discharge: 2021-03-19 | Disposition: A | Discharge status: Home / Self Care | Payer: Medicare Other | Source: Ambulatory Visit | Attending: Radiation Oncology | Admitting: Radiation Oncology

## 2021-03-19 DIAGNOSIS — C7989 Secondary malignant neoplasm of other specified sites: Secondary | ICD-10-CM | POA: Diagnosis not present

## 2021-03-20 ENCOUNTER — Ambulatory Visit
Admission: RE | Admit: 2021-03-20 | Discharge: 2021-03-20 | Disposition: A | Discharge status: Home / Self Care | Payer: Medicare Other | Source: Ambulatory Visit | Attending: Radiation Oncology | Admitting: Radiation Oncology

## 2021-03-20 ENCOUNTER — Other Ambulatory Visit: Payer: Self-pay

## 2021-03-20 DIAGNOSIS — Z01818 Encounter for other preprocedural examination: Secondary | ICD-10-CM | POA: Insufficient documentation

## 2021-03-20 DIAGNOSIS — K0850 Unsatisfactory restoration of tooth, unspecified: Secondary | ICD-10-CM | POA: Insufficient documentation

## 2021-03-20 DIAGNOSIS — K036 Deposits [accretions] on teeth: Secondary | ICD-10-CM | POA: Insufficient documentation

## 2021-03-20 DIAGNOSIS — K06020 Generalized gingival recession, unspecified: Secondary | ICD-10-CM | POA: Insufficient documentation

## 2021-03-20 DIAGNOSIS — Z7901 Long term (current) use of anticoagulants: Secondary | ICD-10-CM | POA: Insufficient documentation

## 2021-03-20 DIAGNOSIS — K08109 Complete loss of teeth, unspecified cause, unspecified class: Secondary | ICD-10-CM | POA: Insufficient documentation

## 2021-03-20 DIAGNOSIS — C7989 Secondary malignant neoplasm of other specified sites: Secondary | ICD-10-CM | POA: Diagnosis not present

## 2021-03-20 DIAGNOSIS — K117 Disturbances of salivary secretion: Secondary | ICD-10-CM | POA: Insufficient documentation

## 2021-03-23 ENCOUNTER — Other Ambulatory Visit: Payer: Self-pay

## 2021-03-23 ENCOUNTER — Ambulatory Visit
Admission: RE | Admit: 2021-03-23 | Discharge: 2021-03-23 | Disposition: A | Discharge status: Home / Self Care | Payer: Medicare Other | Source: Ambulatory Visit | Attending: Radiation Oncology | Admitting: Radiation Oncology

## 2021-03-23 DIAGNOSIS — C7989 Secondary malignant neoplasm of other specified sites: Secondary | ICD-10-CM | POA: Diagnosis not present

## 2021-03-24 ENCOUNTER — Ambulatory Visit
Admission: RE | Admit: 2021-03-24 | Discharge: 2021-03-24 | Disposition: A | Discharge status: Home / Self Care | Payer: Medicare Other | Source: Ambulatory Visit | Attending: Radiation Oncology | Admitting: Radiation Oncology

## 2021-03-24 DIAGNOSIS — C7989 Secondary malignant neoplasm of other specified sites: Secondary | ICD-10-CM | POA: Diagnosis not present

## 2021-03-25 ENCOUNTER — Ambulatory Visit
Admission: RE | Admit: 2021-03-25 | Discharge: 2021-03-25 | Disposition: A | Discharge status: Home / Self Care | Payer: Medicare Other | Source: Ambulatory Visit | Attending: Radiation Oncology | Admitting: Radiation Oncology

## 2021-03-25 ENCOUNTER — Other Ambulatory Visit: Payer: Self-pay

## 2021-03-25 DIAGNOSIS — C7989 Secondary malignant neoplasm of other specified sites: Secondary | ICD-10-CM | POA: Diagnosis not present

## 2021-03-26 ENCOUNTER — Ambulatory Visit
Admission: RE | Admit: 2021-03-26 | Discharge: 2021-03-26 | Disposition: A | Discharge status: Home / Self Care | Payer: Medicare Other | Source: Ambulatory Visit | Attending: Radiation Oncology | Admitting: Radiation Oncology

## 2021-03-26 DIAGNOSIS — C7989 Secondary malignant neoplasm of other specified sites: Secondary | ICD-10-CM | POA: Diagnosis not present

## 2021-03-27 ENCOUNTER — Ambulatory Visit
Admission: RE | Admit: 2021-03-27 | Discharge: 2021-03-27 | Disposition: A | Discharge status: Home / Self Care | Payer: Medicare Other | Source: Ambulatory Visit | Attending: Radiation Oncology | Admitting: Radiation Oncology

## 2021-03-27 ENCOUNTER — Other Ambulatory Visit: Payer: Self-pay

## 2021-03-27 DIAGNOSIS — C7989 Secondary malignant neoplasm of other specified sites: Secondary | ICD-10-CM | POA: Diagnosis not present

## 2021-03-30 ENCOUNTER — Ambulatory Visit
Admission: RE | Admit: 2021-03-30 | Discharge: 2021-03-30 | Disposition: A | Discharge status: Home / Self Care | Payer: Medicare Other | Source: Ambulatory Visit | Attending: Radiation Oncology | Admitting: Radiation Oncology

## 2021-03-30 ENCOUNTER — Other Ambulatory Visit: Payer: Self-pay

## 2021-03-30 DIAGNOSIS — C7989 Secondary malignant neoplasm of other specified sites: Secondary | ICD-10-CM | POA: Diagnosis not present

## 2021-03-31 ENCOUNTER — Other Ambulatory Visit: Payer: Self-pay

## 2021-03-31 ENCOUNTER — Ambulatory Visit
Admission: RE | Admit: 2021-03-31 | Discharge: 2021-03-31 | Disposition: A | Discharge status: Home / Self Care | Payer: Medicare Other | Source: Ambulatory Visit | Attending: Radiation Oncology | Admitting: Radiation Oncology

## 2021-03-31 DIAGNOSIS — C7989 Secondary malignant neoplasm of other specified sites: Secondary | ICD-10-CM | POA: Diagnosis not present

## 2021-04-01 ENCOUNTER — Ambulatory Visit
Admission: RE | Admit: 2021-04-01 | Discharge: 2021-04-01 | Disposition: A | Discharge status: Home / Self Care | Payer: Medicare Other | Source: Ambulatory Visit | Attending: Radiation Oncology | Admitting: Radiation Oncology

## 2021-04-01 DIAGNOSIS — C7989 Secondary malignant neoplasm of other specified sites: Secondary | ICD-10-CM | POA: Diagnosis not present

## 2021-04-02 ENCOUNTER — Ambulatory Visit
Admission: RE | Admit: 2021-04-02 | Discharge: 2021-04-02 | Disposition: A | Discharge status: Home / Self Care | Payer: Medicare Other | Source: Ambulatory Visit | Attending: Radiation Oncology | Admitting: Radiation Oncology

## 2021-04-02 ENCOUNTER — Other Ambulatory Visit: Payer: Self-pay

## 2021-04-02 DIAGNOSIS — C7989 Secondary malignant neoplasm of other specified sites: Secondary | ICD-10-CM | POA: Diagnosis not present

## 2021-04-03 ENCOUNTER — Ambulatory Visit
Admission: RE | Admit: 2021-04-03 | Discharge: 2021-04-03 | Disposition: A | Discharge status: Home / Self Care | Payer: Medicare Other | Source: Ambulatory Visit | Attending: Radiation Oncology | Admitting: Radiation Oncology

## 2021-04-03 DIAGNOSIS — C7989 Secondary malignant neoplasm of other specified sites: Secondary | ICD-10-CM | POA: Diagnosis not present

## 2021-04-06 ENCOUNTER — Ambulatory Visit
Admission: RE | Admit: 2021-04-06 | Discharge: 2021-04-06 | Disposition: A | Discharge status: Home / Self Care | Payer: Medicare Other | Source: Ambulatory Visit | Attending: Radiation Oncology | Admitting: Radiation Oncology

## 2021-04-06 ENCOUNTER — Other Ambulatory Visit: Payer: Self-pay

## 2021-04-06 DIAGNOSIS — C7989 Secondary malignant neoplasm of other specified sites: Secondary | ICD-10-CM | POA: Diagnosis not present

## 2021-04-07 ENCOUNTER — Telehealth: Payer: Self-pay | Admitting: *Deleted

## 2021-04-07 ENCOUNTER — Encounter: Payer: Self-pay | Admitting: Radiation Oncology

## 2021-04-07 ENCOUNTER — Ambulatory Visit
Admission: RE | Admit: 2021-04-07 | Discharge: 2021-04-07 | Disposition: A | Discharge status: Home / Self Care | Payer: Medicare Other | Source: Ambulatory Visit | Attending: Radiation Oncology | Admitting: Radiation Oncology

## 2021-04-07 DIAGNOSIS — C7989 Secondary malignant neoplasm of other specified sites: Secondary | ICD-10-CM | POA: Diagnosis not present

## 2021-04-07 NOTE — Telephone Encounter (Signed)
RETURNED PATIENT'S SON- (SCOTT'S PHONE CALL), SPOKE WITH PATIENT'S SON SCOTT

## 2021-04-07 NOTE — Progress Notes (Signed)
Oncology Nurse Navigator Documentation  ° °Met with Donald Hawkins and his son after final RT to offer support and to celebrate end of radiation treatment.   °Provided/reviewed Epic calendar of upcoming appts. We discussed each upcoming appointment individually. °Explained my role as navigator will continue for several more months, encouraged him or his son to call me with needs/concerns.   ° °  RN, BSN, OCN °Head & Neck Oncology Nurse Navigator °[facility] °Phone # [phone]  °Fax # [phone]   °

## 2021-04-14 ENCOUNTER — Other Ambulatory Visit (INDEPENDENT_AMBULATORY_CARE_PROVIDER_SITE_OTHER): Payer: Self-pay | Admitting: Ophthalmology

## 2021-04-22 ENCOUNTER — Encounter: Payer: Self-pay | Admitting: Physical Therapy

## 2021-04-22 ENCOUNTER — Ambulatory Visit: Payer: Medicare Other | Attending: Radiation Oncology | Admitting: Physical Therapy

## 2021-04-22 ENCOUNTER — Other Ambulatory Visit: Payer: Self-pay

## 2021-04-22 DIAGNOSIS — R293 Abnormal posture: Secondary | ICD-10-CM | POA: Diagnosis not present

## 2021-04-22 DIAGNOSIS — C7989 Secondary malignant neoplasm of other specified sites: Secondary | ICD-10-CM | POA: Insufficient documentation

## 2021-04-22 NOTE — Progress Notes (Signed)
° °                                                                                                                                                          °  Patient Name: Donald VERNET MRN: [id number] DOB: 11-18-1928 Referring Physician: Zola Button (Profile Not Attached) Date of Service: 04/07/2021 [facility], [place]                                                        End Of Treatment Note  Diagnoses: C77.0-Secondary and unspecified malignant neoplasm of lymph nodes of head, face and neck  Cancer Staging:  Cancer Staging  Metastatic squamous cell carcinoma to head and neck [facility]) Staging form: Cutaneous Carcinoma of the Head and Neck, AJCC 8th Edition - Pathologic stage from 02/25/2021: Stage IV (pTX, pN3b, cM0) - Signed by Eppie [place], MD on 02/25/2021 Stage prefix: Initial diagnosis Histologic grade (G): G3 Histologic grading system: 4 grade system Presence of extranodal extension: Present Extraosseous extension: Unknown   Intent: Curative  Radiation Treatment Dates: 03/11/2021 through 04/07/2021 Site Technique Total Dose (Gy) Dose per Fx (Gy) Completed Fx Beam Energies  Neck: HN_Lparot IMRT 50/50 2.5 20/20 6X  Left neck and parotid bed were treated.  Narrative: The patient tolerated radiation therapy relatively well.   Plan: The patient will follow-up with radiation oncology in 2-3 weeks .  -----------------------------------  Eppie [place], MD

## 2021-04-22 NOTE — Therapy (Signed)
[facility] [place], [place], [address] Phone: [phone]   Fax:  [phone]  Physical Therapy Treatment  Patient Details  Name: Donald Hawkins MRN: [id number] Date of Birth: Jan 20, 1929 Referring Provider (PT): Dr. Isidore Moos   Encounter Date: 04/22/2021   PT End of Session - 04/22/21 1541     Visit Number 2    Number of Visits 2    Date for PT Re-Evaluation 04/23/21    PT Start Time 1505    PT Stop Time 1540    PT Time Calculation (min) 35 min    Activity Tolerance Patient tolerated treatment well    Behavior During Therapy [facility] for tasks assessed/performed             Past Medical History:  Diagnosis Date   Cancer ([place])    skin top of head - squamous and basal. "Squamous on left neck"   Depression    Hernia, inguinal    Hypertension     Past Surgical History:  Procedure Laterality Date   ADJACENT TISSUE TRANSFER/TISSUE REARRANGEMENT  01/26/2021   Procedure: Tissue rearrangement Left neck 60 sq cm.;  Surgeon: Melida Quitter, MD;  Location: [facility];  Service: ENT;;   CATARACT EXTRACTION     EYE SURGERY     NASAL SEPTUM SURGERY     40 yrs ago   PAROTIDECTOMY Left 01/26/2021   Procedure: LEFT PAROTIDECTOMY;  Surgeon: Melida Quitter, MD;  Location: [facility];  Service: ENT;  Laterality: Left;   RADICAL NECK DISSECTION Left 01/26/2021   Procedure: MODIFIED RADICAL NECK DISSECTION;  Surgeon: Melida Quitter, MD;  Location: [facility];  Service: ENT;  Laterality: Left;    There were no vitals filed for this visit.   Subjective Assessment - 04/22/21 1506     Subjective I finished the 31st of January - the radiation. Since I finished the radiation I have become very very weak. I am shuffling my feet. I have not had any falls. Walking has gone from halfway normal walking to shuffling and walking extremely carefully. I have not been eating. I can not stand the taste. I have lost down to 151 lbs.    Pertinent History SCC  of left lateral neck, stage IV (TX, N3b, M0), 01/01/21 Biopsy by Dermatology of left neck lump revealed metastatic SCC, and he was referred to Dr. Redmond Baseman, 01/14/21 Consult with Dr. Redmond Baseman who believed the left neck carcinoma likely arose from his previous left external ear skin caner, 01/20/21 CT neck revealed a 1.0 cm enhancing cutaneous lesion overlying the left zygomatic arch, noted to likely correspond with his recent Dx of squamous cell carcinoma. Additionally, necrotic left level II and III/IV lymph nodes were appreciated, the largest of which measuring up to 1.6 cm x 1.9 cm, and consistent with metastatic adenopathy. (The prominent supraclavicular lymph nodes were noted as technically indeterminate but suspicious), 01/26/21 Left Parotidectomy revealing Left lower parotid lymph node dissections of zones 2-4: metastatic poorly differentiated squamous cell carcinoma with necrosis; with metastatic carcinoma involving 10/10 level II lymph nodes, 13/22 level III lymph nodes, and 12/22 level IV lymph nodes. --Left upper parotidectomy: invasive poorly differentiated squamous cell carcinoma involving the parotid gland. All resection margins negative for carcinoma, 02/12/21 PET postoperative changes about the left neck related to the left parotid resection of overlying skin lesion. Increased metabolic activity in this area was noted as more likely related to postoperative changes. No signs of metastases in the neck, chest, abdomen,  or in the pelvis were otherwise visualized, will receive 20 hypofractionated doses of radiation to his left parotid bed/neck. He started on 03/11/21 and will complete 04/07/21.    Patient Stated Goals to gain info from provider    Currently in Pain? No/denies    Pain Score 0-No pain                [facility] PT Assessment - 04/22/21 0001       Balance Screen   Has the patient fallen in the past 6 months No    Has the patient had a decrease in activity level because of a fear of falling?   No   pt reports fear of falling is in the back of his mind   Is the patient reluctant to leave their home because of a fear of falling?  No      Prior Function   Level of Independence Independent    Leisure pt is still walking on the treadmill for about half a mile, pt recently picked up cycling again for 7 miles      Cognition   Overall Cognitive Status Within Functional Limits for tasks assessed      Observation/Other Assessments   Observations left neck still healing from radiation      Sit to Stand   Comments 30 sec sit to stand:  14 reps which is excellent for his age      ROM / Strength   AROM / PROM / Strength Strength      AROM   AROM Assessment Site Cervical    Cervical Flexion WFL    Cervical Extension 25% limited    Cervical - Right Side Bend WFL    Cervical - Left Side Bend 25% limited    Cervical - Right Rotation 25% limited    Cervical - Left Rotation 50% limited      Strength   Overall Strength Comments Bilateral hip flexion 5/5, bilateral ankle DF 5/5               LYMPHEDEMA/ONCOLOGY QUESTIONNAIRE - 04/22/21 0001       Head and Neck   4 cm superior to sternal notch around neck 35.5 cm    6 cm superior to sternal notch around neck 35.5 cm    8 cm superior to sternal notch around neck 35.1 cm                                 PT Education - 04/22/21 1541     Education Details importance of neck ROM exercises, heel/toe raises at counter for support, single limb stance at counter, need for regular exercise starting low and slow and slowly increasing time and resistance    Person(s) Educated Patient    Methods Explanation    Comprehension Verbalized understanding                 PT Long Term Goals - 04/22/21 1542       PT LONG TERM GOAL #1   Title Pt will demonstrate baseline cervical ROM and not demonstrate any signs or symptoms of lymphedema.    Baseline 04/22/21- pt does have some tightness in his neck but  reports he would like to stretch at home to improve it, he does not demonstrate any signs of lymphedema    Time 6    Period Weeks    Status Partially Met  Target Date 04/23/21                   Plan - 04/22/21 1543     Clinical Impression Statement Pt returns to PT after completion of radiation for squamous cell carcinoma of left lateral neck. His left lateral neck is still healing. He does demonstrate some increased tightness with cervical ROM but pt reports that he would like to work on stretching at home. He reports he has been shuffling more recently but is not currently interested in PT for this. He feels if he can begin eating more his energy will come back and he will be able to be more active. Educated pt about importance of daily exercise and starting low and slow and builidng up tolerance. Also educated pt and son about exercises standing at counter including heel/toe raises and single limb stance. Pt's hip flexion and ankle DF strength was 5/5. He was able to complete 14 sit to stands in 30 seconds which is excellent for his age. Educated pt that if he continues to struggle with fatigue and weakness to consult his doctor to get another referral to therapy. Pt will be discharged from skilled PT services at this time.    PT Frequency --   eval and 1 f/u visit   PT Duration 6 weeks    PT Treatment/Interventions ADLs/Self Care Home Management;Patient/family education;Therapeutic exercise    PT Next Visit Plan d/c this visit    PT Home Exercise Plan head and neck ROM exercises    Consulted and Agree with Plan of Care Family member/caregiver;Patient    Family Member Consulted son             Patient will benefit from skilled therapeutic intervention in order to improve the following deficits and impairments:  Postural dysfunction, Decreased knowledge of precautions  Visit Diagnosis: Abnormal posture  Metastatic squamous cell carcinoma to head and neck  [facility])     Problem List Patient Active Problem List   Diagnosis Date Noted   Encounter for preoperative dental examination 03/20/2021   Long term current use of anticoagulant therapy 03/20/2021   Teeth missing 03/20/2021   Accretions on teeth 03/20/2021   Defective dental restoration 03/20/2021   Gingival recession, generalized 03/20/2021   Clinical xerostomia 03/20/2021   Secondary malignant neoplasm of cervical lymph node ([place]) 02/25/2021   Metastatic squamous cell carcinoma to head and neck ([place]) 01/26/2021   Atrial fibrillation ([place]) 01/22/2021   Secondary hypercoagulable state ([place]) 01/22/2021    Juwan Vences [place]ed[place], PT 04/22/2021, 3:52 PM  [facility] @ [place], [place], [address] Phone: [phone]   Fax:  [phone]  Name: Loui Massenburg MRN: [id number] Date of Birth: 10-26-1928  PHYSICAL THERAPY DISCHARGE SUMMARY  Visits from Start of Care: 2  Current functional level related to goals / functional outcomes: Goal was partially met   Remaining deficits: Pt has tightness with cervical ROM but wanted to stretch at home and decline therapy at this time   Education / Equipment: HEP, lymphedema education   Patient agrees to discharge. Patient goals were partially met. Patient is being discharged due to being pleased with the current functional level.  Allyson Sabal [place], [place] 04/22/21 3:53 PM

## 2021-04-23 NOTE — Progress Notes (Signed)
Mr. Biven presents today for follow-up after completing radiation to his left parotid on 04/07/2021  Pain issues, if any: Patient denies Using a feeding tube?: N/A Weight changes, if any:  Wt Readings from Last 3 Encounters:  04/24/21 158 lb 6 oz (71.8 kg)  02/25/21 164 lb 2 oz (74.4 kg)  02/18/21 166 lb 14.4 oz (75.7 kg)   Swallowing issues, if any: Patient denies any trouble swallowing, but does report trouble chewing due to dental issues Smoking or chewing tobacco? None Using fluoride trays daily? Saw Dr. Sandi Mariscal on 03/18/2021 and scheduled for F/U with her on 05/08/21 Last ENT visit was on: Not since diagnosis  Other notable issues, if any: Reports 3 teeth (2 molars and one cap) have fallen out since he completed radiation. Continues to deal with: dry mouth, thick saliva, and fatigue. Skin is healing in treatment area (mild redness/dryness remains). Reports taste is slowly returning

## 2021-04-24 ENCOUNTER — Other Ambulatory Visit: Payer: Self-pay

## 2021-04-24 ENCOUNTER — Encounter: Payer: Self-pay | Admitting: Radiation Oncology

## 2021-04-24 ENCOUNTER — Ambulatory Visit
Admission: RE | Admit: 2021-04-24 | Discharge: 2021-04-24 | Disposition: A | Discharge status: Home / Self Care | Payer: Medicare Other | Source: Ambulatory Visit | Attending: Radiation Oncology | Admitting: Radiation Oncology

## 2021-04-24 VITALS — BP 135/80 | HR 80 | Temp 97.8°F | Resp 18 | Wt 158.4 lb

## 2021-04-24 DIAGNOSIS — C4442 Squamous cell carcinoma of skin of scalp and neck: Secondary | ICD-10-CM | POA: Insufficient documentation

## 2021-04-24 DIAGNOSIS — C770 Secondary and unspecified malignant neoplasm of lymph nodes of head, face and neck: Secondary | ICD-10-CM | POA: Insufficient documentation

## 2021-04-24 DIAGNOSIS — R5383 Other fatigue: Secondary | ICD-10-CM | POA: Diagnosis not present

## 2021-04-24 DIAGNOSIS — Z7901 Long term (current) use of anticoagulants: Secondary | ICD-10-CM | POA: Insufficient documentation

## 2021-04-24 DIAGNOSIS — R682 Dry mouth, unspecified: Secondary | ICD-10-CM | POA: Insufficient documentation

## 2021-04-24 DIAGNOSIS — Z923 Personal history of irradiation: Secondary | ICD-10-CM | POA: Diagnosis not present

## 2021-04-24 DIAGNOSIS — C7989 Secondary malignant neoplasm of other specified sites: Secondary | ICD-10-CM

## 2021-04-24 NOTE — Progress Notes (Signed)
Radiation Oncology         [phone] ________________________________  Name: Breylan Lefevers MRN: [id number]  Date: 04/24/2021  DOB: Jul 25, 1928  Follow-Up Visit Note  CC: Leonard Downing, MD  Wyatt Portela, MD  Diagnosis and Prior Radiotherapy:       ICD-10-CM   1. Metastatic squamous cell carcinoma to head and neck (HCC)  C79.89       Radiation Treatment Dates: 03/11/2021 through 04/07/2021 Site Technique Total Dose (Gy) Dose per Fx (Gy) Completed Fx Beam Energies  Neck: HN_Lparot IMRT 50/50 2.5 20/20 6X  Left neck and parotid bed were treated.  CHIEF COMPLAINT:  Here for follow-up and surveillance of neck cancer  Narrative:  The patient returns today for routine follow-up.  Mr. Gangemi presents today for follow-up after completing radiation to his left parotid on 04/07/2021  Pain issues, if any: Patient denies Using a feeding tube?: N/A Weight changes, if any:  Wt Readings from Last 3 Encounters:  04/24/21 158 lb 6 oz (71.8 kg)  02/25/21 164 lb 2 oz (74.4 kg)  02/18/21 166 lb 14.4 oz (75.7 kg)   Swallowing issues, if any: Patient denies any trouble swallowing, but does report trouble chewing due to dental issues Smoking or chewing tobacco? None Using fluoride trays daily? Saw Dr. Sandi Mariscal on 03/18/2021 and scheduled for F/U with her on 05/08/21 Last ENT visit was on: Not since diagnosis  Other notable issues, if any: Reports 3 teeth (2 molars and one cap) have fallen out since he completed radiation. Continues to deal with: dry mouth, thick saliva, and fatigue. Skin is healing in treatment area (mild redness/dryness remains). Reports taste is slowly returning                     ALLERGIES:  has No Known Allergies.  Meds: Current Outpatient Medications  Medication Sig Dispense Refill   acetaminophen (TYLENOL) 650 MG CR tablet Take 650 mg by mouth every 8 (eight) hours as needed for pain.     apixaban (ELIQUIS) 5 MG TABS tablet Take 1 tablet (5 mg total) by mouth 2  (two) times daily. 60 tablet 3   brimonidine (ALPHAGAN) 0.2 % ophthalmic solution INSTILL 1 DROP INTO BOTH  EYES TWO TIMES DAILY 30 mL 3   dorzolamide-timolol (COSOPT) 22.3-6.8 MG/ML ophthalmic solution Place 1 drop into both eyes in the morning and at bedtime.     ibuprofen (ADVIL) 200 MG tablet Take 400 mg by mouth every 8 (eight) hours as needed for headache or mild pain.     latanoprost (XALATAN) 0.005 % ophthalmic solution Place 1 drop into both eyes at bedtime.     polyvinyl alcohol (LIQUIFILM TEARS) 1.4 % ophthalmic solution Place 1 drop into both eyes every 4 (four) hours as needed (for dryness).     No current facility-administered medications for this encounter.    Physical Findings: The patient is in no acute distress. Patient is alert and oriented. Wt Readings from Last 3 Encounters:  04/24/21 158 lb 6 oz (71.8 kg)  02/25/21 164 lb 2 oz (74.4 kg)  02/18/21 166 lb 14.4 oz (75.7 kg)    weight is 158 lb 6 oz (71.8 kg). His temporal temperature is 97.8 F (36.6 C). His blood pressure is 135/80 and his pulse is 80. His respiration is 18 and oxygen saturation is 100%. .  General: Alert and oriented, in no acute distress HEENT: Head is normocephalic. Extraocular movements are intact. Oropharynx is notable for no  lesions, but teeth are brittle. Mucosa is moist Neck: Neck is notable for no masses; skin is healing - desquamation has improved. No oozing. There are scabs/dry patches resolving (left neck). Skin: Skin in treatment fields shows satisfactory healing thus far as above Lymphatics: see Neck Exam Psychiatric: Judgment and insight are intact. Affect is appropriate.   Lab Findings: Lab Results  Component Value Date   WBC 6.9 01/22/2021   HGB 14.3 01/22/2021   HCT 43.1 01/22/2021   MCV 95.8 01/22/2021   PLT 182 01/22/2021    Lab Results  Component Value Date   TSH 1.559 03/11/2021    Radiographic Findings: No results found.  Impression/Plan:    1) Head and Neck  Cancer Status: healing well from RT  2) Nutritional Status:  Wt Readings from Last 3 Encounters:  04/24/21 158 lb 6 oz (71.8 kg)  02/25/21 164 lb 2 oz (74.4 kg)  02/18/21 166 lb 14.4 oz (75.7 kg)   Push intake as tolerated. Does best with sweet food right now.  Gravies/sauces as needed for starchy foods that get pasty.  PEG tube: none  3)  Swallowing: see #2.  Functional.  Biotene gel PRN for mastication/oral intake.  4) Dental: Encouraged to continue regular followup with dentistry, and dental hygiene including fluoride rinses. Brittle teeth preceded RT. Seeing Dr Benson Norway.  5) Thyroid function: check annually Lab Results  Component Value Date   TSH 1.559 03/11/2021    6) Follow-up in 2- 2.5 months. The patient was encouraged to call with any issues or questions before then. Will order CT neck/chest prior to visit.  On date of service, in total, I spent 30 minutes on this encounter. Patient was seen in person. _____________________________________   Eppie Gibson, MD

## 2021-04-29 ENCOUNTER — Other Ambulatory Visit (HOSPITAL_COMMUNITY): Payer: Self-pay | Admitting: *Deleted

## 2021-04-29 MED ORDER — APIXABAN 5 MG PO TABS: 5.0000 mg | ORAL_TABLET | Freq: Two times a day (BID) | ORAL | 2 refills | Status: DC

## 2021-05-07 ENCOUNTER — Telehealth (HOSPITAL_COMMUNITY): Payer: Self-pay

## 2021-05-07 NOTE — Telephone Encounter (Signed)
I spoke with patients son regarding having to cancel patients appt. For 05-08-21. Dr. Benson Norway will be on Med. Leave of Absence starting 05-11-21 for 12 weeks. I gave them the option to r/s or see patients reg. DDS and contact me with anything that the other office may need regarding patients dental hx with Dental Medicine. Patients son stated that he would contact his fathers reg DDS and let me know if they needed anything, he did not want to r/s at this time.  ?

## 2021-05-08 ENCOUNTER — Other Ambulatory Visit (HOSPITAL_COMMUNITY): Payer: Medicare Other | Admitting: Dentistry

## 2021-06-08 DIAGNOSIS — H9192 Unspecified hearing loss, left ear: Secondary | ICD-10-CM | POA: Insufficient documentation

## 2021-06-08 DIAGNOSIS — R2689 Other abnormalities of gait and mobility: Secondary | ICD-10-CM | POA: Insufficient documentation

## 2021-06-10 ENCOUNTER — Ambulatory Visit (INDEPENDENT_AMBULATORY_CARE_PROVIDER_SITE_OTHER): Payer: Medicare Other | Admitting: Physician Assistant

## 2021-06-10 ENCOUNTER — Encounter: Payer: Self-pay | Admitting: Physician Assistant

## 2021-06-10 VITALS — BP 124/76 | HR 76 | Temp 98.7°F | Ht 70.0 in | Wt 155.0 lb

## 2021-06-10 DIAGNOSIS — R531 Weakness: Secondary | ICD-10-CM

## 2021-06-10 DIAGNOSIS — C7989 Secondary malignant neoplasm of other specified sites: Secondary | ICD-10-CM

## 2021-06-10 DIAGNOSIS — I4891 Unspecified atrial fibrillation: Secondary | ICD-10-CM | POA: Diagnosis not present

## 2021-06-10 DIAGNOSIS — Z7689 Persons encountering health services in other specified circumstances: Secondary | ICD-10-CM

## 2021-06-10 DIAGNOSIS — D485 Neoplasm of uncertain behavior of skin: Secondary | ICD-10-CM | POA: Insufficient documentation

## 2021-06-10 DIAGNOSIS — R7309 Other abnormal glucose: Secondary | ICD-10-CM

## 2021-06-10 DIAGNOSIS — R030 Elevated blood-pressure reading, without diagnosis of hypertension: Secondary | ICD-10-CM

## 2021-06-10 NOTE — Progress Notes (Signed)
? ?New Patient Office Visit ? ?Subjective:  ?Patient ID: Donald Hawkins, male    DOB: 1928-04-27  Age: 88 y.o. MRN: [id number] ? ?CC:  ?Chief Complaint  ?Patient presents with  ? New Patient (Initial Visit)  ? ? ?HPI ?Donald Hawkins presents to establish care. Patient is accompanied by his son. It has been several years (>5) since pt has been to a PCP. Patient recently treatment for metastatic squamous cell carcinoma to the head and neck. Patient is s/p surgical resection (November 2022) and completed radiation therapy 04/07/21. Patient has a hx of atrial fibrillation and is followed by cardiology. Currently on anticoagulant therapy with Eliquis 5 mg. Patient reports his blood pressure is usually elevated at the doctor's office, he does keep track of BP at home and usually runs 120/50. States has not check it this past week. Patient's son reports the last 2 weeks has been using his walker more because feels weak but the last few days there has been some improvement and walking a little more. Patient reports radiation has altered his taste so food does not taste good. Attempts to have 1-2 Ensure drinks per day. But usually has small meals or sometimes will skip a meal. ? ? ? ?Past Medical History:  ?Diagnosis Date  ? [facility])   ? skin top of head - squamous and basal. "Squamous on left neck"  ? Depression   ? Hernia, inguinal   ? Hypertension   ? ? ?Past Surgical History:  ?Procedure Laterality Date  ? ADJACENT TISSUE TRANSFER/TISSUE REARRANGEMENT  01/26/2021  ? Procedure: Tissue rearrangement Left neck 60 sq cm.;  Surgeon: Melida Quitter, MD;  Location: [facility];  Service: ENT;;  ? CATARACT EXTRACTION    ? EYE SURGERY    ? NASAL SEPTUM SURGERY    ? 40 yrs ago  ? PAROTIDECTOMY Left 01/26/2021  ? Procedure: LEFT PAROTIDECTOMY;  Surgeon: Melida Quitter, MD;  Location: [facility];  Service: ENT;  Laterality: Left;  ? RADICAL NECK DISSECTION Left 01/26/2021  ? Procedure: MODIFIED RADICAL NECK DISSECTION;  Surgeon: Melida Quitter, MD;   Location: [facility];  Service: ENT;  Laterality: Left;  ? ? ?Family History  ?Problem Relation Age of Onset  ? Emphysema Mother   ? Cancer Father   ? ? ?Social History  ? ?Socioeconomic History  ? Marital status: Widowed  ?  Spouse name: Not on file  ? Number of children: Not on file  ? Years of education: Not on file  ? Highest education level: Not on file  ?Occupational History  ?  Comment: retired Editor, commissioning replacement  ?Tobacco Use  ? Smoking status: Never  ? Smokeless tobacco: Never  ?Vaping Use  ? Vaping Use: Never used  ?Substance and Sexual Activity  ? Alcohol use: Not Currently  ?  Alcohol/week: 1.0 standard drink  ?  Types: 1 Glasses of wine per week  ? Drug use: No  ? Sexual activity: Not on file  ?Other Topics Concern  ? Not on file  ?Social History Narrative  ? Not on file  ? ?Social Determinants of Health  ? ?Financial Resource Strain: Not on file  ?Food Insecurity: Not on file  ?Transportation Needs: Not on file  ?Physical Activity: Not on file  ?Stress: Not on file  ?Social Connections: Not on file  ?Intimate Partner Violence: Not on file  ? ? ?ROS ?Review of Systems ?Review of Systems:  ?A fourteen system review of systems was performed and found to be positive as  per HPI. ? ?Objective:  ? ?Today's Vitals: BP 124/76   Pulse 76   Temp 98.7 ?F (37.1 ?C)   Ht _0  (1.778 m)   Wt 155 lb (70.3 kg)   SpO2 98%   BMI 22.24 kg/m?  ? ?Physical Exam ?General:  Pleasant and cooperative, in no acute distress, using cane for ambulation  ?Neuro:  Alert and oriented,  extra-ocular muscles intact  ?HEENT:  Normocephalic, atraumatic, neck supple  ?Skin:  no gross rash, warm, pink. ?Cardiac:  IRR, no murmur ?Respiratory: CTA B/L  ?Vascular:  Ext warm, no cyanosis apprec.; cap RF less 2 sec. ?Psych:  No HI/SI, judgement and insight good, Euthymic mood. Full Affect. ? ?Assessment & Plan:  ? ?Problem List Items Addressed This Visit   ? ?  ? Cardiovascular and Mediastinum  ? Atrial fibrillation ([place]) - Primary  ?   -Followed by Cardiology. ?-Reviewed cardiology/a-fib consult 01/22/2021, pt currently rate controlled.  ?-On Eliquis 5 mg. ?  ?  ? Relevant Orders  ? CBC w/Diff  ? Comp Met (CMET)  ? TSH  ?  ? Other  ? Metastatic squamous cell carcinoma to head and neck (HCC)  ? Relevant Orders  ? TSH  ? ?Other Visit Diagnoses   ? ? Elevated glucose      ? Relevant Orders  ? HgB A1c  ? Generalized weakness      ? Relevant Orders  ? CBC w/Diff  ? Comp Met (CMET)  ? TSH  ? HgB A1c  ? Encounter to establish care      ? Elevated blood pressure reading      ? ?  ? ?Metastatic squamous cell carcinoma to head and neck: ?-Reviewed records in Epic.  ?-Pt s/p surgical resection (left parotidectomy) and completed RT. ? ?Elevated blood pressure reading: ?-Patient has dx of hypertension in his chart but question dx, sounds like BP elevated in office is more likely related to white coat syndrome. BP elevated on intake (162/78), repeated and significantly improved to 124/76. Pulse normal at 76 bpm. Recommend continue ambulatory BP monitoring.  ? ?Generalized weakness: ?-Symptoms likely secondary to cancer treatments and decreased nutritional intake. Recommend to continue to use walker. Recommend to start Ensure Plus if Ensure Alive expensive. If continues to struggle with appropriate nutrition then recommend referral to nutritionist. Reviewed last set of labs. Will collect labs to evaluate for secondary etiologies such as anemia, thyroid disorder or electrolyte imbalance.  ? ?Outpatient Encounter Medications as of 06/10/2021  ?Medication Sig  ? acetaminophen (TYLENOL) 650 MG CR tablet Take 650 mg by mouth every 8 (eight) hours as needed for pain.  ? apixaban (ELIQUIS) 5 MG TABS tablet Take 1 tablet (5 mg total) by mouth 2 (two) times daily.  ? brimonidine (ALPHAGAN) 0.2 % ophthalmic solution INSTILL 1 DROP INTO BOTH  EYES TWO TIMES DAILY  ? dorzolamide-timolol (COSOPT) 22.3-6.8 MG/ML ophthalmic solution Place 1 drop into both eyes in the morning and  at bedtime.  ? ibuprofen (ADVIL) 200 MG tablet Take 400 mg by mouth every 8 (eight) hours as needed for headache or mild pain.  ? latanoprost (XALATAN) 0.005 % ophthalmic solution Place 1 drop into both eyes at bedtime.  ? polyvinyl alcohol (LIQUIFILM TEARS) 1.4 % ophthalmic solution Place 1 drop into both eyes every 4 (four) hours as needed (for dryness).  ? ?No facility-administered encounter medications on file as of 06/10/2021.  ? ? ?Follow-up: Return in about 2 months (around 08/10/2021) for [facility] .  ? ?  Lorrene Reid, PA-C ? ?

## 2021-06-10 NOTE — Assessment & Plan Note (Signed)
-  Followed by Cardiology. ?-Reviewed cardiology/a-fib consult 01/22/2021, pt currently rate controlled.  ?-On Eliquis 5 mg. ?

## 2021-06-10 NOTE — Patient Instructions (Signed)
Weakness Weakness is a lack of strength. You may feel weak all over your body (generalized), or you may feel weak in one part of your body (focal). There are many potential causes of weakness. Sometimes, the cause of your weakness may not be known. Some causes of weakness can be serious, so it isimportant to see your doctor. Follow these instructions at home: Activity Rest as needed. Try to get enough sleep. Most adults need 7-8 hours of sleep each night. Talk to your doctor about how much sleep you need each night. Do exercises, such as arm curls and leg raises, for 30 minutes at least 2 days a week or as told by your doctor. Think about working with a physical therapist or trainer to help you get stronger. General instructions  Take over-the-counter and prescription medicines only as told by your doctor. Eat a healthy, well-balanced diet. This includes: Proteins to build muscles, such as lean meats and fish. Fresh fruits and vegetables. Carbohydrates to boost energy, such as whole grains. Drink enough fluid to keep your pee (urine) pale yellow. Keep all follow-up visits as told by your doctor. This is important.  Contact a doctor if: Your weakness does not get better or it gets worse. Your weakness affects your ability to: Think clearly. Do your normal daily activities. Get help right away if you: Have sudden weakness on one side of your face or body. Have chest pain. Have trouble breathing or shortness of breath. Have problems with your vision. Have trouble talking or swallowing. Have trouble standing or walking. Are light-headed. Pass out (lose consciousness). Summary Weakness is a lack of strength. You may feel weak all over your body or just in one part of your body. There are many potential causes of weakness. Sometimes, the cause of your weakness may not be known. Rest as needed, and try to get enough sleep. Most adults need 7-8 hours of sleep each night. Eat a healthy,  well-balanced diet. This information is not intended to replace advice given to you by your health care provider. Make sure you discuss any questions you have with your healthcare provider. Document Revised: 09/28/2017 Document Reviewed: 09/28/2017 Elsevier Patient Education  2022 Elsevier Inc.  

## 2021-06-11 LAB — COMPREHENSIVE METABOLIC PANEL
ALT: 10 IU/L (ref 0–44)
AST: 27 IU/L (ref 0–40)
Albumin/Globulin Ratio: 1.2 (ref 1.2–2.2)
Albumin: 3.9 g/dL (ref 3.5–4.6)
Alkaline Phosphatase: 107 IU/L (ref 44–121)
BUN/Creatinine Ratio: 17 (ref 10–24)
BUN: 16 mg/dL (ref 10–36)
Bilirubin Total: 0.7 mg/dL (ref 0.0–1.2)
CO2: 21 mmol/L (ref 20–29)
Calcium: 9.8 mg/dL (ref 8.6–10.2)
Chloride: 101 mmol/L (ref 96–106)
Creatinine, Ser: 0.92 mg/dL (ref 0.76–1.27)
Globulin, Total: 3.3 g/dL (ref 1.5–4.5)
Sodium: 139 mmol/L (ref 134–144)
Total Protein: 7.2 g/dL (ref 6.0–8.5)
eGFR: 79 mL/min/{1.73_m2} (ref >59)

## 2021-06-11 LAB — CBC WITH DIFFERENTIAL/PLATELET
Basophils Absolute: 0 10*3/uL (ref 0.0–0.2)
Basos: 0 %
EOS (ABSOLUTE): 0.1 10*3/uL (ref 0.0–0.4)
Eos: 2 %
Hematocrit: 40.7 % (ref 37.5–51.0)
Hemoglobin: 13.8 g/dL (ref 13.0–17.7)
Immature Grans (Abs): 0 10*3/uL (ref 0.0–0.1)
Immature Granulocytes: 0 %
Lymphocytes Absolute: 1.7 10*3/uL (ref 0.7–3.1)
Lymphs: 31 %
MCH: 31.4 pg (ref 26.6–33.0)
MCHC: 33.9 g/dL (ref 31.5–35.7)
MCV: 93 fL (ref 79–97)
Monocytes Absolute: 0.6 10*3/uL (ref 0.1–0.9)
Monocytes: 11 %
Neutrophils Absolute: 3 10*3/uL (ref 1.4–7.0)
Neutrophils: 56 %
Platelets: 189 10*3/uL (ref 150–450)
RBC: 4.4 x10E6/uL (ref 4.14–5.80)
RDW: 12.7 % (ref 11.6–15.4)
WBC: 5.4 10*3/uL (ref 3.4–10.8)

## 2021-06-11 LAB — HEMOGLOBIN A1C
Est. average glucose Bld gHb Est-mCnc: 111 mg/dL
Hgb A1c MFr Bld: 5.5 % (ref 4.8–5.6)

## 2021-06-11 LAB — TSH: TSH: 1.39 u[IU]/mL (ref 0.450–4.500)

## 2021-06-16 ENCOUNTER — Telehealth: Payer: Self-pay | Admitting: Oncology

## 2021-06-16 NOTE — Telephone Encounter (Signed)
Called patient regarding upcoming appointment, patient is notified. °

## 2021-06-17 ENCOUNTER — Other Ambulatory Visit: Payer: Self-pay

## 2021-06-17 ENCOUNTER — Inpatient Hospital Stay: Payer: Medicare Other | Attending: Oncology | Admitting: Oncology

## 2021-06-17 VITALS — BP 136/78 | HR 83 | Temp 98.1°F | Resp 17 | Ht 70.0 in | Wt 154.6 lb

## 2021-06-17 DIAGNOSIS — C4440 Unspecified malignant neoplasm of skin of scalp and neck: Secondary | ICD-10-CM | POA: Diagnosis not present

## 2021-06-17 DIAGNOSIS — C7989 Secondary malignant neoplasm of other specified sites: Secondary | ICD-10-CM | POA: Diagnosis not present

## 2021-06-17 DIAGNOSIS — C07 Malignant neoplasm of parotid gland: Secondary | ICD-10-CM | POA: Diagnosis present

## 2021-06-17 NOTE — Progress Notes (Signed)
Hematology and Oncology Follow Up Visit ? ?Donald Hawkins ?[id number] ?04-13-1928 88 y.o. ?06/17/2021 3:09 PM ?Lorrene Reid, PA-CElkins, Curt Jews, *  ? ?Principle Diagnosis: 88 year old man with squamous cell carcinoma of left helix diagnosed in 2022.  Subsequently developed isolated area of metastasis to the left lateral neck diagnosed in October 2022.   ? ?Prior Therapy: ? ?He is status post left superficial parotidectomy and a modified radical neck dissection completed on January 26, 2021.  The final pathology showed metastatic squamous cell carcinoma involving the parotid gland and lymph nodes. ? ?Radiation therapy for the left parotid gland between January 4 and April 07, 2021.  He received 50 Gray in 20 fractions. ? ?Current therapy: Active surveillance. ? ? ?Interim History: Mr. Hackler is here for repeat evaluation.  Since the last visit, he completed radiation therapy under the care of Dr. Isidore Moos in January 2023.  He reports feeling reasonably well without any major complaints.  He denies any nausea, vomiting or abdominal pain.  He denies any residual complications related to radiation treatment.  He denies any dysphagia or odontophagia.  He denies any shortness of breath.  He did report some steady visit occasional weakness and uses a walker for ambulation at home.  He denies any falls or syncope. ? ? ? ? ?Medications: Updated on review.  ?Current Outpatient Medications  ?Medication Sig Dispense Refill  ? acetaminophen (TYLENOL) 650 MG CR tablet Take 650 mg by mouth every 8 (eight) hours as needed for pain.    ? apixaban (ELIQUIS) 5 MG TABS tablet Take 1 tablet (5 mg total) by mouth 2 (two) times daily. 180 tablet 2  ? brimonidine (ALPHAGAN) 0.2 % ophthalmic solution INSTILL 1 DROP INTO BOTH  EYES TWO TIMES DAILY 30 mL 3  ? dorzolamide-timolol (COSOPT) 22.3-6.8 MG/ML ophthalmic solution Place 1 drop into both eyes in the morning and at bedtime.    ? ibuprofen (ADVIL) 200 MG tablet Take 400 mg by mouth  every 8 (eight) hours as needed for headache or mild pain.    ? latanoprost (XALATAN) 0.005 % ophthalmic solution Place 1 drop into both eyes at bedtime.    ? polyvinyl alcohol (LIQUIFILM TEARS) 1.4 % ophthalmic solution Place 1 drop into both eyes every 4 (four) hours as needed (for dryness).    ? ?No current facility-administered medications for this visit.  ? ? ? ?Allergies: No Known Allergies ? ? ? ?Physical Exam: ?Blood pressure 136/78, pulse 83, temperature 98.1 ?F (36.7 ?C), temperature source Temporal, resp. rate 17, height '5\' 10"'$  (1.778 m), weight 154 lb 9.6 oz (70.1 kg), SpO2 100 %. ? ? ?ECOG: 1 ? ? ?General appearance: Comfortable appearing without any discomfort ?Head: Normocephalic without any trauma ?Oropharynx: Mucous membranes are moist and pink without any thrush or ulcers. ?Eyes: Pupils are equal and round reactive to light. ?Lymph nodes: No cervical, supraclavicular, inguinal or axillary lymphadenopathy.   ?Heart:regular rate and rhythm.  S1 and S2 without leg edema. ?Lung: Clear without any rhonchi or wheezes.  No dullness to percussion. ?Abdomin: Soft, nontender, nondistended with good bowel sounds.  No hepatosplenomegaly. ?Musculoskeletal: No joint deformity or effusion.  Full range of motion noted. ?Neurological: No deficits noted on motor, sensory and deep tendon reflex exam. ?Skin: No petechial rash or dryness.  Appeared moist.  ?Psychiatric: Mood and affect appeared appropriate. ? ? ? ? ?Lab Results: ?Lab Results  ?Component Value Date  ? WBC 5.4 06/10/2021  ? HGB 13.8 06/10/2021  ? HCT 40.7 06/10/2021  ?  MCV 93 06/10/2021  ? PLT 189 06/10/2021  ? ?  Chemistry   ?   ?Component Value Date/Time  ? NA 139 06/10/2021 1615  ? K CANCELED 06/10/2021 1615  ? CL 101 06/10/2021 1615  ? CO2 21 06/10/2021 1615  ? BUN 16 06/10/2021 1615  ? CREATININE 0.92 06/10/2021 1615  ?    ?Component Value Date/Time  ? CALCIUM 9.8 06/10/2021 1615  ? ALKPHOS 107 06/10/2021 1615  ? AST 27 06/10/2021 1615  ? ALT 10  06/10/2021 1615  ? BILITOT 0.7 06/10/2021 1615  ?  ? ? ?  ? ? ?Impression and Plan: ? ? ? ?88 year old with: ? ?1.  Squamous cell carcinoma of the left helix diagnosed in 2022.  He developed stage IV disease involving the parotid gland  in October 2022.   ? ? ?He underwent surgical resection followed by adjuvant radiation therapy which was completed in January 2023.  The natural course of this disease and treatment choices were reiterated.  The role for systemic therapy was discussed at this time and will be deferred unless he has metastatic disease.  He is undergoing surveillance CT scan under the care of Dr. Isidore Moos and we will follow-up on these results.   ?  ?2.  Follow-up: Will be in 6 months for repeat evaluation. ?  ?30  minutes were dedicated to this visit.  The time was spent on reviewing disease status update, treatment choices and future plan of care review. ? ?Zola Button, MD ?4/12/20233:09 PM ? ?

## 2021-06-29 ENCOUNTER — Ambulatory Visit (HOSPITAL_COMMUNITY): Payer: Medicare Other

## 2021-06-29 ENCOUNTER — Ambulatory Visit (HOSPITAL_COMMUNITY): Admission: RE | Admit: 2021-06-29 | Payer: Medicare Other | Source: Ambulatory Visit

## 2021-06-29 ENCOUNTER — Telehealth: Payer: Self-pay | Admitting: *Deleted

## 2021-06-29 NOTE — Telephone Encounter (Signed)
Called patient's son Gwyndolyn Saxon to see when his dad would be willing to do a CT, lvm for a return call ?

## 2021-06-30 ENCOUNTER — Ambulatory Visit: Payer: Medicare Other | Admitting: Radiation Oncology

## 2021-06-30 ENCOUNTER — Other Ambulatory Visit: Payer: Self-pay

## 2021-06-30 ENCOUNTER — Telehealth: Payer: Self-pay | Admitting: *Deleted

## 2021-06-30 ENCOUNTER — Encounter: Payer: Self-pay | Admitting: Radiation Oncology

## 2021-06-30 ENCOUNTER — Ambulatory Visit
Admission: RE | Admit: 2021-06-30 | Discharge: 2021-06-30 | Disposition: A | Discharge status: Home / Self Care | Payer: Medicare Other | Source: Ambulatory Visit | Attending: Radiation Oncology | Admitting: Radiation Oncology

## 2021-06-30 VITALS — BP 161/93 | HR 65 | Temp 98.3°F | Resp 18 | Ht 70.0 in | Wt 153.0 lb

## 2021-06-30 DIAGNOSIS — Z7901 Long term (current) use of anticoagulants: Secondary | ICD-10-CM | POA: Insufficient documentation

## 2021-06-30 DIAGNOSIS — C770 Secondary and unspecified malignant neoplasm of lymph nodes of head, face and neck: Secondary | ICD-10-CM | POA: Insufficient documentation

## 2021-06-30 DIAGNOSIS — Z923 Personal history of irradiation: Secondary | ICD-10-CM | POA: Diagnosis not present

## 2021-06-30 DIAGNOSIS — K4091 Unilateral inguinal hernia, without obstruction or gangrene, recurrent: Secondary | ICD-10-CM | POA: Diagnosis not present

## 2021-06-30 DIAGNOSIS — C7989 Secondary malignant neoplasm of other specified sites: Secondary | ICD-10-CM

## 2021-06-30 DIAGNOSIS — C4442 Squamous cell carcinoma of skin of scalp and neck: Secondary | ICD-10-CM | POA: Diagnosis not present

## 2021-06-30 NOTE — Telephone Encounter (Signed)
xxxxx 

## 2021-06-30 NOTE — Telephone Encounter (Signed)
Called patient to inform of CT for 07-13-21- arrival time - 10 am @ [facility], patient to have water only- 4 hrs. Prior to test, patient to receive results from Dr. Isidore Moos on 07-14-21 @ 3:40 pm via telephone, spoke with patient's son- Nicki Reaper and he is aware of these appts. ?

## 2021-06-30 NOTE — Progress Notes (Signed)
?Radiation Oncology         [phone] ?________________________________ ? ?Name: Donald Hawkins MRN: [id number]  ?Date: 06/30/2021  DOB: 05/07/1928 ? ?Follow-Up Visit Note ? ?CC: Lorrene Reid, PA-C  Wyatt Portela, MD ? ?Diagnosis and Prior Radiotherapy:     ?  ICD-10-CM   ?1. Metastatic squamous cell carcinoma to head and neck (HCC)  C79.89   ?  ?2. Recurrent inguinal hernia without obstruction or gangrene, unspecified laterality  K40.91 Ambulatory referral to General Surgery  ?  ? ? ?Radiation Treatment Dates: 03/11/2021 through 04/07/2021 ?Site Technique Total Dose (Gy) Dose per Fx (Gy) Completed Fx Beam Energies  ?Neck: HN_Lparot IMRT 50/50 2.5 20/20 6X  ?Left neck and parotid bed were treated. ? ?CHIEF COMPLAINT:  Here for follow-up and surveillance of neck cancer ? ?Narrative:   Mr. Ohlin presents today for follow-up after completing radiation to his left parotid on 04/07/2021 ? ?Pain issues, if any: Patient denies any mouth or throat pain, or ear or jaw pain. ?Using a feeding tube?: N/A ?Weight changes, if any:  ?Wt Readings from Last 3 Encounters:  ?06/30/21 153 lb (69.4 kg)  ?06/17/21 154 lb 9.6 oz (70.1 kg)  ?06/10/21 155 lb (70.3 kg)  ? ?Swallowing issues, if any: Patient denies. He does admit that the thick saliva and lack of saliva make it difficult for him to chew/eat meat and bread-like foods, but denies any issues with swallowing. ?Smoking or chewing tobacco? None ?Using fluoride trays daily? N/A--Scheduled to see his community dentist 07/09/2021 ?Last ENT visit was on: Not since diagnosis ?Other notable issues, if any: Continues to deal with dry mouth and thick saliva. Had F/U with medical oncologist Dr. Alen Blew on 06/17/21. Son reports patient's fatigue has worsened the past month. Both patient and son's main concern is patient's worsening hernia.  ? ?His main issues are difficulty ambulating due to generalized balance problems but no vertigo.  He recently saw his otolaryngologist who ruled out inner  ear issues.  He worries that he will fall and he walks with small careful steps.  He is also having fatigue that waxes and wanes.  He also reports a chronic inguinal hernia that has never been addressed and is bothersome.  He requests a referral to get this evaluated.  He skipped his restaging scans due to feeling poorly but now he is feeling better than a few days ago. ? ?ALLERGIES:  has No Known Allergies. ? ?Meds: ?Current Outpatient Medications  ?Medication Sig Dispense Refill  ? acetaminophen (TYLENOL) 650 MG CR tablet Take 650 mg by mouth every 8 (eight) hours as needed for pain.    ? apixaban (ELIQUIS) 5 MG TABS tablet Take 1 tablet (5 mg total) by mouth 2 (two) times daily. 180 tablet 2  ? brimonidine (ALPHAGAN) 0.2 % ophthalmic solution INSTILL 1 DROP INTO BOTH  EYES TWO TIMES DAILY 30 mL 3  ? dorzolamide-timolol (COSOPT) 22.3-6.8 MG/ML ophthalmic solution Place 1 drop into both eyes in the morning and at bedtime.    ? ibuprofen (ADVIL) 200 MG tablet Take 400 mg by mouth every 8 (eight) hours as needed for headache or mild pain.    ? latanoprost (XALATAN) 0.005 % ophthalmic solution Place 1 drop into both eyes at bedtime.    ? polyvinyl alcohol (LIQUIFILM TEARS) 1.4 % ophthalmic solution Place 1 drop into both eyes every 4 (four) hours as needed (for dryness).    ? ?No current facility-administered medications for this encounter.  ? ? ?Physical Findings: ?  The patient is in no acute distress. Patient is alert and oriented. ?Wt Readings from Last 3 Encounters:  ?06/30/21 153 lb (69.4 kg)  ?06/17/21 154 lb 9.6 oz (70.1 kg)  ?06/10/21 155 lb (70.3 kg)  ? ? height is '5\' 10"'$  (1.778 m) and weight is 153 lb (69.4 kg). His temperature is 98.3 ?F (36.8 ?C). His blood pressure is 161/93 (abnormal) and his pulse is 65. His respiration is 18 and oxygen saturation is 100%. Marland Kitchen  ?General: Alert and oriented, in no acute distress ?HEENT: Head is normocephalic. Extraocular movements are intact. Oropharynx is notable for no  lesions. Mucosa is moist.  Scarring present in left tympanic membrane ?Neck: Neck is notable for no masses; skin is slightly dry throughout neck with some residual hyperpigmentation in the left neck ?Lymphatics: see Neck Exam ?Psychiatric: Judgment and insight are intact. Affect is appropriate. ?MSK: He is able to walk down the hallway holding my arm.  He initially takes small cautious steps and then is able to ambulate more intentionally ? ?Lab Findings: ?Lab Results  ?Component Value Date  ? WBC 5.4 06/10/2021  ? HGB 13.8 06/10/2021  ? HCT 40.7 06/10/2021  ? MCV 93 06/10/2021  ? PLT 189 06/10/2021  ? ? ?Lab Results  ?Component Value Date  ? TSH 1.390 06/10/2021  ? ? ?Radiographic Findings: ?No results found. ? ?Impression/Plan:   ? ?1) Head and Neck Cancer Status: healing well from RT ? ?2) Nutritional Status:  ?Wt Readings from Last 3 Encounters:  ?06/30/21 153 lb (69.4 kg)  ?06/17/21 154 lb 9.6 oz (70.1 kg)  ?06/10/21 155 lb (70.3 kg)  ? ?Stable weight as above ? ?PEG tube: none ? ?3)  Swallowing:  Functional.   No new issues ? ?4) Dental: Encouraged to continue regular followup with dentistry, and dental hygiene including fluoride rinses. Brittle teeth preceded RT. Seeing Dr Benson [place]. ? ?5) Thyroid function: check annually ?Lab Results  ?Component Value Date  ? TSH 1.390 06/10/2021  ? ? ?6) He is at risk for local and systemic relapse and he understands that restaging scans are important if he would like to know if he is in remission.  He is now agreeable to rescheduling CT neck/chest -I will call him with the results and we will then determine timing of next follow-up based on those results. ? ?7) Regarding new issues with ambulation -this may be due to generalized deconditioning.  I recommend he get back in touch with his physical therapist.  He and his son plan to do so. ? ?8) Regarding chronic inguinal hernia which she reports is bothersome, we will refer him at his request to general surgery. ? ?On date of  service, in total, I spent 30 minutes on this encounter. Patient was seen in person. ?_____________________________________ ? ? ?Eppie Gibson, MD  ?

## 2021-06-30 NOTE — Progress Notes (Signed)
Donald Hawkins presents today for follow-up after completing radiation to his left parotid on 04/07/2021 ? ?Pain issues, if any: Patient denies any mouth or throat pain, or ear or jaw pain. ?Using a feeding tube?: N/A ?Weight changes, if any:  ?Wt Readings from Last 3 Encounters:  ?06/30/21 153 lb (69.4 kg)  ?06/17/21 154 lb 9.6 oz (70.1 kg)  ?06/10/21 155 lb (70.3 kg)  ? ?Swallowing issues, if any: Patient denies. He does admit that the thick saliva and lack of saliva make it difficult for him to chew/eat meat and bread-like foods, but denies any issues with swallowing. ?Smoking or chewing tobacco? None ?Using fluoride trays daily? N/A--Scheduled to see his community dentist 07/09/2021 ?Last ENT visit was on: Not since diagnosis ?Other notable issues, if any: Continues to deal with dry mouth and thick saliva. Had F/U with medical oncologist Dr. Alen Blew on 06/17/21. Son reports patient's fatigue has worsened the past month. Both patient and son's main concern is patient's worsening hernia.  ? ? ? ?

## 2021-07-02 ENCOUNTER — Telehealth: Payer: Self-pay | Admitting: *Deleted

## 2021-07-02 NOTE — Telephone Encounter (Signed)
CALLED PATIENT TO INFORM OF APPT. WITH DR. Rosendo Gros ON 07-20-21 - ARRIVAL TIME- 2 PM - [address], PH. NUMBER [phone], LVM FOR A RETURN CALL ?

## 2021-07-06 ENCOUNTER — Telehealth: Payer: Self-pay | Admitting: Physician Assistant

## 2021-07-06 NOTE — Telephone Encounter (Signed)
Patients son called and said that patient had a rough weekend, he has a hernia and now he is severely constipated and they would just like some recommendations if you can please give him a call back at [phone] Nicki Reaper) ?

## 2021-07-06 NOTE — Telephone Encounter (Signed)
Patient is aware and has an appointment with surgeon in two weeks.  ?

## 2021-07-06 NOTE — Telephone Encounter (Signed)
lmtc

## 2021-07-13 ENCOUNTER — Encounter (HOSPITAL_COMMUNITY): Payer: Self-pay

## 2021-07-13 ENCOUNTER — Ambulatory Visit (HOSPITAL_COMMUNITY)
Admission: RE | Admit: 2021-07-13 | Discharge: 2021-07-13 | Disposition: A | Discharge status: Home / Self Care | Payer: Medicare Other | Source: Ambulatory Visit | Attending: Radiation Oncology | Admitting: Radiation Oncology

## 2021-07-13 DIAGNOSIS — C7989 Secondary malignant neoplasm of other specified sites: Secondary | ICD-10-CM | POA: Diagnosis present

## 2021-07-13 MED ORDER — IOHEXOL 300 MG/ML  SOLN
100.0000 mL | Freq: Once | INTRAMUSCULAR | Status: AC | PRN
  Administered 2021-07-13: 75 mL via INTRAVENOUS

## 2021-07-13 MED ORDER — SODIUM CHLORIDE (PF) 0.9 % IJ SOLN
INTRAMUSCULAR | Status: AC
  Filled 2021-07-13: qty 50

## 2021-07-14 ENCOUNTER — Ambulatory Visit
Admission: RE | Admit: 2021-07-14 | Discharge: 2021-07-14 | Disposition: A | Discharge status: Home / Self Care | Payer: Medicare Other | Source: Ambulatory Visit | Attending: Radiation Oncology | Admitting: Radiation Oncology

## 2021-07-14 DIAGNOSIS — C770 Secondary and unspecified malignant neoplasm of lymph nodes of head, face and neck: Secondary | ICD-10-CM | POA: Insufficient documentation

## 2021-07-14 DIAGNOSIS — C7989 Secondary malignant neoplasm of other specified sites: Secondary | ICD-10-CM | POA: Insufficient documentation

## 2021-07-15 ENCOUNTER — Encounter: Payer: Self-pay | Admitting: Radiation Oncology

## 2021-07-15 NOTE — Progress Notes (Signed)
?Radiation Oncology         [phone] ?________________________________ ? ?Name: Donald Hawkins MRN: [id number]  ?Date: 07/14/2021  DOB: 11-May-1928 ? ?Follow-Up Visit Note by telephone.  The patient opted for telemedicine to maximize safety during the pandemic.  MyChart video was not obtainable.  ? ?CC: Lorrene Reid, PA-C  Wyatt Portela, MD ? ?Diagnosis and Prior Radiotherapy:     ?  ICD-10-CM   ?1. Metastatic squamous cell carcinoma to head and neck (HCC)  C79.89   ?  ?2. Secondary malignant neoplasm of cervical lymph node ([facility])  C77.0   ?  ? ? ?Radiation Treatment Dates: 03/11/2021 through 04/07/2021 ?Site Technique Total Dose (Gy) Dose per Fx (Gy) Completed Fx Beam Energies  ?Neck: HN_Lparot IMRT 50/50 2.5 20/20 6X  ?Left neck and parotid bed were treated. ? ?CHIEF COMPLAINT:  Here for follow-up and surveillance of neck cancer ? ?Narrative:    Patient is doing about the same as in April Main issue is inguinal hernia that is bothersome. Surgical consult soon. Here to review CT results. ? ?ALLERGIES:  has No Known Allergies. ? ?Meds: ?Current Outpatient Medications  ?Medication Sig Dispense Refill  ? acetaminophen (TYLENOL) 650 MG CR tablet Take 650 mg by mouth every 8 (eight) hours as needed for pain.    ? apixaban (ELIQUIS) 5 MG TABS tablet Take 1 tablet (5 mg total) by mouth 2 (two) times daily. 180 tablet 2  ? brimonidine (ALPHAGAN) 0.2 % ophthalmic solution INSTILL 1 DROP INTO BOTH  EYES TWO TIMES DAILY 30 mL 3  ? dorzolamide-timolol (COSOPT) 22.3-6.8 MG/ML ophthalmic solution Place 1 drop into both eyes in the morning and at bedtime.    ? ibuprofen (ADVIL) 200 MG tablet Take 400 mg by mouth every 8 (eight) hours as needed for headache or mild pain.    ? latanoprost (XALATAN) 0.005 % ophthalmic solution Place 1 drop into both eyes at bedtime.    ? polyvinyl alcohol (LIQUIFILM TEARS) 1.4 % ophthalmic solution Place 1 drop into both eyes every 4 (four) hours as needed (for dryness).    ? ?No current  facility-administered medications for this encounter.  ? ? ?Physical Findings: ?The patient is in no acute distress. Patient is alert and oriented. ?Wt Readings from Last 3 Encounters:  ?06/30/21 153 lb (69.4 kg)  ?06/17/21 154 lb 9.6 oz (70.1 kg)  ?06/10/21 155 lb (70.3 kg)  ? ? vitals were not taken for this visit. .   ? ?Lab Findings: ?Lab Results  ?Component Value Date  ? WBC 5.4 06/10/2021  ? HGB 13.8 06/10/2021  ? HCT 40.7 06/10/2021  ? MCV 93 06/10/2021  ? PLT 189 06/10/2021  ? ? ?Lab Results  ?Component Value Date  ? TSH 1.390 06/10/2021  ? ? ?Radiographic Findings: ?CT Soft Tissue Neck W Contrast ? ?Result Date: 07/14/2021 ?CLINICAL DATA:  Head neck cancer, assess treatment response. EXAM: CT NECK WITH CONTRAST TECHNIQUE: Multidetector CT imaging of the neck was performed using the standard protocol following the bolus administration of intravenous contrast. RADIATION DOSE REDUCTION: This exam was performed according to the departmental dose-optimization program which includes automated exposure control, adjustment of the mA and/or kV according to patient size and/or use of iterative reconstruction technique. CONTRAST:  10m OMNIPAQUE IOHEXOL 300 MG/ML  SOLN COMPARISON:  01/20/2021 FINDINGS: Pharynx and larynx: No evidence of mass or inflammation. Salivary glands: Left parotidectomy with non masslike architectural distortion and low-density in the operative region. No mass or inflammation of the remaining  salivary glands. Thyroid: Unremarkable Lymph nodes: Left neck dissection with non masslike architectural distortion and low-density. No enlarged or heterogeneous nodes Vascular: Atheromatous plaque most notable at the carotid bifurcations. Limited intracranial: Negative Visualized orbits: Limited coverage is negative for acute finding. Bilateral cataract resection Mastoids and visualized paranasal sinuses: Patchy sinus opacification at the level of the ethmoids. Low-density polyp in the posterior left nasal  cavity extending into the nasopharynx, 2.4 cm in length. Skeleton: No acute finding Upper chest: No acute finding IMPRESSION: Post treatment left neck with no evidence of residual or recurrent disease. Electronically Signed   By: Jorje Guild M.D.   On: 07/14/2021 07:35  ? ?CT Chest W Contrast ? ?Result Date: 07/14/2021 ?CLINICAL DATA:  Head and neck cancer, left parotid neoplasm, status post parotidectomy and XRT, assess treatment response * Tracking Code: BO * EXAM: CT CHEST WITH CONTRAST TECHNIQUE: Multidetector CT imaging of the chest was performed during intravenous contrast administration. RADIATION DOSE REDUCTION: This exam was performed according to the departmental dose-optimization program which includes automated exposure control, adjustment of the mA and/or kV according to patient size and/or use of iterative reconstruction technique. CONTRAST:  21m OMNIPAQUE IOHEXOL 300 MG/ML  SOLN COMPARISON:  PET-CT, 02/12/2021 FINDINGS: Cardiovascular: Aortic atherosclerosis. Aortic valve calcifications. Mild cardiomegaly. Left coronary artery calcifications. No pericardial effusion. Mediastinum/Nodes: Newly enlarged bilateral axillary lymph nodes, left greater than right, largest left axillary nodes measuring 2.0 x 1.2 cm (series 5, image 22). Slightly enlarged pretracheal and AP window lymph nodes, largest pretracheal node measuring 2.0 x 1.1 cm, previously 1.7 x 0.8 cm (series 5, image 31). Thyroid gland, trachea, and esophagus demonstrate no significant findings. Lungs/Pleura: Mild, diffuse bilateral bronchial wall thickening. No pleural effusion or pneumothorax. Upper Abdomen: No acute abnormality. Musculoskeletal: No chest wall abnormality. No suspicious osseous lesions identified. IMPRESSION: 1. Newly enlarged bilateral axillary lymph nodes, left greater than right. Slightly enlarged pretracheal and AP window lymph nodes. Findings are highly concerning for nodal metastatic disease. 2. Mild, diffuse bilateral  bronchial wall thickening, consistent with nonspecific infectious or inflammatory bronchitis. 3. Coronary artery disease. 4. Aortic valve calcifications. Correlate for echocardiographic evidence of aortic valve dysfunction. Aortic Atherosclerosis (ICD10-I70.0). Electronically Signed   By: ADelanna AhmadiM.D.   On: 07/14/2021 10:58   ? ?Impression/Plan:   ? ?1) Head and Neck Cancer Status: I personally review his CT neck/ chest. NED in neck but there are enlarged nodes in axillae and chest concerning for disease progression. ? ?We discussed observation, biopsy of axillary node on left, and PET. I recommend bx of axillary node on left.   If positive, he may be a candidate for systemic therapy . Patient and son will consider these options and get back to me.  Priority for QOL right now is getting hernia repaired if he is a candidate. ? ? ?2) Nutritional Status:  ?Wt Readings from Last 3 Encounters:  ?06/30/21 153 lb (69.4 kg)  ?06/17/21 154 lb 9.6 oz (70.1 kg)  ?06/10/21 155 lb (70.3 kg)  ? ?Stable weight as above ? ?PEG tube: none ? ?3)  Swallowing:  Functional.   No new issues ? ?4) Dental: Encouraged to continue regular followup with dentistry, and dental hygiene including fluoride rinses. Brittle teeth preceded RT. Seeing Dr OBenson Norway ? ?5) Thyroid function: check annually ?Lab Results  ?Component Value Date  ? TSH 1.390 06/10/2021  ? ?  ? ?This encounter was provided by telemedicine platform; patient desired telemedicine during pandemic precautions.  MyChart video was not obtainable,  so phone was used. ?The patient has given verbal consent for this type of encounter and has been advised to only accept a meeting of this type in a secure network environment. ?On date of service, in total, I spent 30 minutes on this encounter. I signed this note 2 days after I conducted the encounter.  Date of service 07-14-21. ?The attendants for this meeting include Eppie Gibson  and Carsen Kasparek's son. ?During the encounter, Eppie Gibson was located at [facility] Radiation Oncology Department.  ?Zayon Trulson was located at home.  ? ?_____________________________________ ? ? ?Eppie Gibson, MD  ?

## 2021-07-20 ENCOUNTER — Ambulatory Visit: Payer: Self-pay | Admitting: General Surgery

## 2021-07-20 NOTE — H&P (Signed)
?Chief Complaint: Hernia ?  ?  ?  ?History of Present Illness: ?Donald Hawkins is a 88 y.o. male who is seen today as an office consultation at the request of Dr. Isidore Moos for evaluation of Hernia ?Marland Kitchen   ?Patient is a 88 year old male, with history of metastatic squamous cell, status post prostatectomy and radical neck dissection.  Patient currently undergoing radiation therapy. ?  ?Patient comes in with a long history of a right inguinal hernia.  He states this been there for multiple years.  Patient uses a truss to help keep the hernia in place however has been increasing in size and has dropped down through the scrotum. ?  ?Patient states he lives on his own, is active, used to bike and still rides a stationary bicycle. ?  ?Patient's had no previous abdominal surgery. ?  ?Patient currently on Eliquis for his A-fib. ?  ?  ?Review of Systems: ?A complete review of systems was obtained from the patient.  I have reviewed this information and discussed as appropriate with the patient.  See HPI as well for other ROS. ?  ?Review of Systems  ?Constitutional: Negative for fever.  ?HENT: Negative for congestion.   ?Eyes: Negative for blurred vision.  ?Respiratory: Negative for cough, shortness of breath and wheezing.   ?Cardiovascular: Negative for chest pain and palpitations.  ?Gastrointestinal: Negative for heartburn.  ?Genitourinary: Negative for dysuria.  ?Musculoskeletal: Negative for myalgias.  ?Skin: Negative for rash.  ?Neurological: Negative for dizziness and headaches.  ?Psychiatric/Behavioral: Negative for depression and suicidal ideas.  ?All other systems reviewed and are negative. ?  ?  ?  ?Medical History: ?Past Medical History ?Past Medical History: ?Diagnosis Date ? Glaucoma (increased eye pressure)   ? History of cancer   ? ?  ?  ?There is no problem list on file for this patient. ?  ?  ?Past Surgical History ?History reviewed. No pertinent surgical history. ?  ?  ?Allergies ?No Known Allergies ? ?  ?Current  Outpatient Medications on File Prior to Visit ?Medication Sig Dispense Refill ? dorzolamide-timoloL (COSOPT) 22.3-6.8 mg/mL ophthalmic solution       ? ELIQUIS 5 mg tablet       ? latanoprost (XALATAN) 0.005 % ophthalmic solution Apply 1 drop to eye at bedtime     ?  ?No current facility-administered medications on file prior to visit. ?  ?  ?Family History ?History reviewed. No pertinent family history. ?  ?  ?Social History ?  ?Tobacco Use ?Smoking Status Never ?Smokeless Tobacco Never ?  ?  ?Social History ?Social History ?  ? ?Socioeconomic History ? Marital status: Widowed ?Tobacco Use ? Smoking status: Never ? Smokeless tobacco: Never ?Substance and Sexual Activity ? Alcohol use: Not Currently ? Drug use: Not Currently ? ?  ?  ?Objective: ?  ?  ?Vitals: ?  07/20/21 1429 ?BP: 128/78 ?Pulse: 73 ?Weight: 68 kg (150 lb) ?Height: 177.8 cm ('5\' 10"'$ ) ?  ?Body mass index is 21.52 kg/m?Marland Kitchen ?Physical Exam ?Constitutional:   ?   Appearance: Normal appearance.  ?HENT:  ?   Head: Normocephalic and atraumatic.  ?   Nose: Nose normal. No congestion.  ?   Mouth/Throat:  ?   Mouth: Mucous membranes are moist.  ?   Pharynx: Oropharynx is clear.  ?Eyes:  ?   Pupils: Pupils are equal, round, and reactive to light.  ?Cardiovascular:  ?   Rate and Rhythm: Normal rate and regular rhythm.  ?   Pulses: Normal pulses.  ?  Heart sounds: Normal heart sounds. No murmur heard. ?  No friction rub. No gallop.  ?Pulmonary:  ?   Effort: Pulmonary effort is normal. No respiratory distress.  ?   Breath sounds: Normal breath sounds. No stridor. No wheezing, rhonchi or rales.  ?Abdominal:  ?   General: Abdomen is flat.  ?   Hernia: A hernia is present. Hernia is present in the right inguinal area.  ?Musculoskeletal:     ?   General: Normal range of motion.  ?   Cervical back: Normal range of motion.  ?Skin: ?   General: Skin is warm and dry.  ?Neurological:  ?   General: No focal deficit present.  ?   Mental Status: He is alert and oriented to  person, place, and time.  ?Psychiatric:     ?   Mood and Affect: Mood normal.     ?   Thought Content: Thought content normal.  ?  ?  ?  ?  ?Assessment and Plan: ?Diagnoses and all orders for this visit: ?  ?Unilateral recurrent inguinal hernia without obstruction or gangrene ?  ?  ?Donald Hawkins is a 88 y.o. male  ?  ?1.  We will proceed to the OR for a open right inguinal hernia repair with mesh. ?2. All risks and benefits were discussed with the patient, to generally include infection, bleeding, damage to surrounding structures, acute and chronic nerve pain, and recurrence. Alternatives were offered and described.  All questions were answered and the patient voiced understanding of the procedure and wishes to proceed at this point. ?  ?  ?  ?  ?  ?  ?No follow-ups on file. ?  ?Ralene Ok, MD, FACS ?[facility], [place] ?General & Minimally Invasive Surgery ? ?

## 2021-07-22 ENCOUNTER — Emergency Department (HOSPITAL_COMMUNITY): Payer: Medicare Other

## 2021-07-22 ENCOUNTER — Inpatient Hospital Stay (HOSPITAL_COMMUNITY)
Admission: EM | Admit: 2021-07-22 | Discharge: 2021-07-29 | DRG: 064 | Disposition: A | Discharge status: Skilled Nursing Facility | Payer: Medicare Other | Attending: Internal Medicine | Admitting: Internal Medicine

## 2021-07-22 ENCOUNTER — Telehealth: Payer: Self-pay | Admitting: *Deleted

## 2021-07-22 ENCOUNTER — Encounter (HOSPITAL_COMMUNITY): Payer: Self-pay

## 2021-07-22 ENCOUNTER — Other Ambulatory Visit: Payer: Self-pay

## 2021-07-22 DIAGNOSIS — Z923 Personal history of irradiation: Secondary | ICD-10-CM | POA: Diagnosis not present

## 2021-07-22 DIAGNOSIS — I6349 Cerebral infarction due to embolism of other cerebral artery: Secondary | ICD-10-CM | POA: Diagnosis present

## 2021-07-22 DIAGNOSIS — I480 Paroxysmal atrial fibrillation: Secondary | ICD-10-CM | POA: Diagnosis present

## 2021-07-22 DIAGNOSIS — I639 Cerebral infarction, unspecified: Secondary | ICD-10-CM | POA: Diagnosis present

## 2021-07-22 DIAGNOSIS — G4700 Insomnia, unspecified: Secondary | ICD-10-CM | POA: Diagnosis not present

## 2021-07-22 DIAGNOSIS — R29702 NIHSS score 2: Secondary | ICD-10-CM | POA: Diagnosis present

## 2021-07-22 DIAGNOSIS — I959 Hypotension, unspecified: Secondary | ICD-10-CM | POA: Diagnosis not present

## 2021-07-22 DIAGNOSIS — R471 Dysarthria and anarthria: Secondary | ICD-10-CM | POA: Diagnosis present

## 2021-07-22 DIAGNOSIS — C7989 Secondary malignant neoplasm of other specified sites: Secondary | ICD-10-CM | POA: Diagnosis present

## 2021-07-22 DIAGNOSIS — E43 Unspecified severe protein-calorie malnutrition: Secondary | ICD-10-CM | POA: Diagnosis present

## 2021-07-22 DIAGNOSIS — H409 Unspecified glaucoma: Secondary | ICD-10-CM | POA: Diagnosis present

## 2021-07-22 DIAGNOSIS — I6389 Other cerebral infarction: Secondary | ICD-10-CM | POA: Diagnosis not present

## 2021-07-22 DIAGNOSIS — F05 Delirium due to known physiological condition: Secondary | ICD-10-CM | POA: Diagnosis not present

## 2021-07-22 DIAGNOSIS — C44229 Squamous cell carcinoma of skin of left ear and external auricular canal: Secondary | ICD-10-CM | POA: Diagnosis present

## 2021-07-22 DIAGNOSIS — Z809 Family history of malignant neoplasm, unspecified: Secondary | ICD-10-CM

## 2021-07-22 DIAGNOSIS — Z9221 Personal history of antineoplastic chemotherapy: Secondary | ICD-10-CM | POA: Diagnosis not present

## 2021-07-22 DIAGNOSIS — G459 Transient cerebral ischemic attack, unspecified: Secondary | ICD-10-CM | POA: Diagnosis not present

## 2021-07-22 DIAGNOSIS — Z79899 Other long term (current) drug therapy: Secondary | ICD-10-CM

## 2021-07-22 DIAGNOSIS — Z681 Body mass index (BMI) 19 or less, adult: Secondary | ICD-10-CM | POA: Diagnosis not present

## 2021-07-22 DIAGNOSIS — R29810 Facial weakness: Secondary | ICD-10-CM | POA: Diagnosis present

## 2021-07-22 DIAGNOSIS — R410 Disorientation, unspecified: Secondary | ICD-10-CM | POA: Diagnosis not present

## 2021-07-22 DIAGNOSIS — R1310 Dysphagia, unspecified: Secondary | ICD-10-CM | POA: Diagnosis not present

## 2021-07-22 DIAGNOSIS — Z825 Family history of asthma and other chronic lower respiratory diseases: Secondary | ICD-10-CM

## 2021-07-22 DIAGNOSIS — I10 Essential (primary) hypertension: Secondary | ICD-10-CM | POA: Diagnosis present

## 2021-07-22 DIAGNOSIS — Z20822 Contact with and (suspected) exposure to covid-19: Secondary | ICD-10-CM | POA: Diagnosis present

## 2021-07-22 DIAGNOSIS — Z7901 Long term (current) use of anticoagulants: Secondary | ICD-10-CM | POA: Diagnosis not present

## 2021-07-22 LAB — I-STAT CHEM 8, ED
BUN: 11 mg/dL (ref 8–23)
Calcium, Ion: 1.08 mmol/L — ABNORMAL LOW (ref 1.15–1.40)
Chloride: 106 mmol/L (ref 98–111)
Creatinine, Ser: 0.8 mg/dL (ref 0.61–1.24)
Glucose, Bld: 104 mg/dL — ABNORMAL HIGH (ref 70–99)
HCT: 41 % (ref 39.0–52.0)
Hemoglobin: 13.9 g/dL (ref 13.0–17.0)
Potassium: 4 mmol/L (ref 3.5–5.1)
Sodium: 137 mmol/L (ref 135–145)
TCO2: 22 mmol/L (ref 22–32)

## 2021-07-22 LAB — URINALYSIS, ROUTINE W REFLEX MICROSCOPIC
Bacteria, UA: NONE SEEN
Bilirubin Urine: NEGATIVE
Glucose, UA: NEGATIVE mg/dL
Hgb urine dipstick: NEGATIVE
Ketones, ur: NEGATIVE mg/dL
Leukocytes,Ua: NEGATIVE
Nitrite: NEGATIVE
Protein, ur: NEGATIVE mg/dL
Specific Gravity, Urine: 1.006 (ref 1.005–1.030)
pH: 7 (ref 5.0–8.0)

## 2021-07-22 LAB — COMPREHENSIVE METABOLIC PANEL
ALT: 12 U/L (ref 0–44)
AST: 25 U/L (ref 15–41)
Albumin: 3.4 g/dL — ABNORMAL LOW (ref 3.5–5.0)
Alkaline Phosphatase: 82 U/L (ref 38–126)
Anion gap: 10 (ref 5–15)
BUN: 10 mg/dL (ref 8–23)
CO2: 21 mmol/L — ABNORMAL LOW (ref 22–32)
Calcium: 9.3 mg/dL (ref 8.9–10.3)
Chloride: 105 mmol/L (ref 98–111)
Creatinine, Ser: 0.82 mg/dL (ref 0.61–1.24)
GFR, Estimated: >60 mL/min (ref >60)
Glucose, Bld: 120 mg/dL — ABNORMAL HIGH (ref 70–99)
Potassium: 4 mmol/L (ref 3.5–5.1)
Sodium: 136 mmol/L (ref 135–145)
Total Bilirubin: 0.8 mg/dL (ref 0.3–1.2)
Total Protein: 7 g/dL (ref 6.5–8.1)

## 2021-07-22 LAB — PROTIME-INR
INR: 1.3 — ABNORMAL HIGH (ref 0.8–1.2)
Prothrombin Time: 16.2 seconds — ABNORMAL HIGH (ref 11.4–15.2)

## 2021-07-22 LAB — CBC
HCT: 42.3 % (ref 39.0–52.0)
Hemoglobin: 13.9 g/dL (ref 13.0–17.0)
MCH: 32.5 pg (ref 26.0–34.0)
MCHC: 32.9 g/dL (ref 30.0–36.0)
MCV: 98.8 fL (ref 80.0–100.0)
Platelets: 155 10*3/uL (ref 150–400)
RBC: 4.28 MIL/uL (ref 4.22–5.81)
RDW: 13.4 % (ref 11.5–15.5)
WBC: 6.4 10*3/uL (ref 4.0–10.5)
nRBC: 0 % (ref 0.0–0.2)

## 2021-07-22 LAB — RAPID URINE DRUG SCREEN, HOSP PERFORMED
Amphetamines: NOT DETECTED
Barbiturates: NOT DETECTED
Benzodiazepines: NOT DETECTED
Cocaine: NOT DETECTED
Opiates: NOT DETECTED
Tetrahydrocannabinol: NOT DETECTED

## 2021-07-22 LAB — CBG MONITORING, ED: Glucose-Capillary: 120 mg/dL — ABNORMAL HIGH (ref 70–99)

## 2021-07-22 LAB — RESP PANEL BY RT-PCR (FLU A&B, COVID) ARPGX2
Influenza A by PCR: NEGATIVE
Influenza B by PCR: NEGATIVE
SARS Coronavirus 2 by RT PCR: NEGATIVE

## 2021-07-22 MED ORDER — ACETAMINOPHEN 325 MG PO TABS
650.0000 mg | ORAL_TABLET | Freq: Four times a day (QID) | ORAL | Status: DC | PRN
Start: 2021-07-22 — End: 2021-07-29
  Administered 2021-07-26 – 2021-07-28 (×2): 650 mg via ORAL
  Filled 2021-07-22 (×2): qty 2

## 2021-07-22 MED ORDER — ASPIRIN 81 MG PO CHEW
81.0000 mg | CHEWABLE_TABLET | Freq: Every day | ORAL | Status: DC
  Administered 2021-07-23: 81 mg via ORAL
  Filled 2021-07-22 (×2): qty 1

## 2021-07-22 MED ORDER — POLYETHYLENE GLYCOL 3350 17 G PO PACK: 17.0000 g | PACK | Freq: Every day | ORAL | Status: DC | PRN

## 2021-07-22 MED ORDER — IOHEXOL 350 MG/ML SOLN
100.0000 mL | Freq: Once | INTRAVENOUS | Status: AC | PRN
  Administered 2021-07-22: 100 mL via INTRAVENOUS

## 2021-07-22 MED ORDER — DORZOLAMIDE HCL-TIMOLOL MAL 2-0.5 % OP SOLN
1.0000 [drp] | Freq: Two times a day (BID) | OPHTHALMIC | Status: DC
  Administered 2021-07-23 – 2021-07-29 (×14): 1 [drp] via OPHTHALMIC
  Filled 2021-07-22: qty 10

## 2021-07-22 MED ORDER — ONDANSETRON HCL 4 MG/2ML IJ SOLN: 4.0000 mg | Freq: Four times a day (QID) | INTRAMUSCULAR | Status: DC | PRN

## 2021-07-22 MED ORDER — STROKE: EARLY STAGES OF RECOVERY BOOK
Freq: Once | Status: AC
  Filled 2021-07-22: qty 1

## 2021-07-22 MED ORDER — ENOXAPARIN SODIUM 40 MG/0.4ML IJ SOSY
40.0000 mg | PREFILLED_SYRINGE | INTRAMUSCULAR | Status: DC
  Administered 2021-07-23: 40 mg via SUBCUTANEOUS
  Filled 2021-07-22: qty 0.4

## 2021-07-22 MED ORDER — LATANOPROST 0.005 % OP SOLN
1.0000 [drp] | Freq: Every day | OPHTHALMIC | Status: DC
  Administered 2021-07-23 – 2021-07-28 (×7): 1 [drp] via OPHTHALMIC
  Filled 2021-07-22: qty 2.5

## 2021-07-22 MED ORDER — ONDANSETRON HCL 4 MG PO TABS: 4.0000 mg | ORAL_TABLET | Freq: Four times a day (QID) | ORAL | Status: DC | PRN

## 2021-07-22 MED ORDER — HYDRALAZINE HCL 20 MG/ML IJ SOLN: 10.0000 mg | Freq: Four times a day (QID) | INTRAMUSCULAR | Status: DC | PRN

## 2021-07-22 MED ORDER — ACETAMINOPHEN 650 MG RE SUPP: 650.0000 mg | Freq: Four times a day (QID) | RECTAL | Status: DC | PRN

## 2021-07-22 MED ORDER — BRIMONIDINE TARTRATE 0.2 % OP SOLN
1.0000 [drp] | Freq: Two times a day (BID) | OPHTHALMIC | Status: DC
  Administered 2021-07-23 – 2021-07-29 (×14): 1 [drp] via OPHTHALMIC
  Filled 2021-07-22: qty 5

## 2021-07-22 NOTE — H&P (Signed)
?History and Physical  ? ? ?Patient: Donald Hawkins MRN: [id number] DOA: 07/22/2021 ? ?Date of Service: the patient was seen and examined on 07/22/2021 ? ?Patient coming from: Home ? ?Chief Complaint:  ?Chief Complaint  ?Patient presents with  ? Weakness  ? ? ?HPI:  ? 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 ? ?Last known normal was approximately 9 AM the morning of 5/17.  Patient's son first noted that at approximately 2:30 PM when he was called by the patient he noted that he was exhibiting slurred speech.  The son then asked his wife to go check on the patient who additionally noted that the patient's right facial droop seemed worse than baseline.  Patient denies associated focal weakness or changes in vision but does endorse some increasing difficulty with balance today. ? ?Patient then presented to [facility] emergency department but upon arrival his original symptoms had resolved. ? ?During his evaluation in the emergency department patient was initially evaluated by Dr. Cheral Marker with neurology and it was confirmed the patient was back at baseline.  However shortly thereafter patient once again began to exhibit worsening right facial droop and dysarthria.  This prompted the ER provider initiating a code stroke and Dr. Rory Percy with neurology came to reevaluate the patient.  It was determined the patient was not a candidate for tPA.  Stat CT perfusion study was performed revealing a small area of ischemic penumbra in the left frontal operculum concerning for early stroke.  The hospitalist group was then called  to assess the patient for admission to the hospital. ? ?Review of Systems: Review of Systems  ?Neurological:  Positive for sensory change and speech change.  ? ? ?Past Medical History:  ?Diagnosis Date  ? Cancer [facility])   ? skin top of head - squamous and basal. "Squamous on left neck"  ? Depression   ? Hernia, inguinal   ? Hypertension   ? ? ?Past Surgical History:  ?Procedure Laterality Date  ? ADJACENT TISSUE TRANSFER/TISSUE REARRANGEMENT  01/26/2021  ? Procedure: Tissue rearrangement Left neck 60 sq cm.;  Surgeon: Melida Quitter, MD;  Location: [place];  Service: ENT;;  ? CATARACT EXTRACTION    ? EYE SURGERY    ? NASAL SEPTUM SURGERY    ? 40 yrs ago  ? PAROTIDECTOMY Left 01/26/2021  ? Procedure: LEFT PAROTIDECTOMY;  Surgeon: Melida Quitter, MD;  Location: [place];  Service: ENT;  Laterality: Left;  ? RADICAL NECK DISSECTION Left 01/26/2021  ? Procedure: MODIFIED RADICAL NECK DISSECTION;  Surgeon: Melida Quitter, MD;  Location: [place];  Service: ENT;  Laterality: Left;  ? ? ?Social History:  reports that he has never smoked. He has never used smokeless tobacco. He reports that he does not currently use alcohol after a past usage of about 1.0 standard drink per week. He reports that he does not use drugs. ? ?No Known Allergies ? ?Family History  ?Problem Relation Age of Onset  ? Emphysema Mother   ? Cancer Father   ? ? ?Prior to Admission medications   ?Medication Sig Start Date End Date Taking? Authorizing Provider  ?acetaminophen (TYLENOL) 650 MG CR tablet Take 650 mg by  mouth every 8 (eight) hours as needed for pain.    [provider]  ?apixaban (ELIQUIS) 5 MG TABS tablet Take 1 tablet (5 mg total) by mouth 2 (two) times daily. 04/29/21   Fenton, Clint R, PA  ?brimonidine (ALPHAGAN) 0.2 % ophthalmic solution INSTILL 1 DROP INTO BOTH  EYES TWO TIMES DAILY 06/22/19   Bernarda Caffey, MD  ?dorzolamide-timolol (COSOPT) 22.3-6.8 MG/ML ophthalmic solution Place 1 drop into both eyes in the morning and at bedtime.     [provider]  ?ibuprofen (ADVIL) 200 MG tablet Take 400 mg by mouth every 8 (eight) hours as needed for headache or mild pain.    [provider]  ?latanoprost (XALATAN) 0.005 % ophthalmic solution Place 1 drop into both eyes at bedtime. 12/25/19   [provider]  ?polyvinyl alcohol (LIQUIFILM TEARS) 1.4 % ophthalmic solution Place 1 drop into both eyes every 4 (four) hours as needed (for dryness).    [provider]  ? ? ?Physical Exam: ? ?Vitals:  ? 07/22/21 1925 07/22/21 2105 07/22/21 2110 07/22/21 2200  ?BP:    (!) 147/98  ?Pulse: 84 96 76 84  ?Resp:      ?Temp:      ?TempSrc:      ?SpO2: 99% 92% 97% 98%  ? ? ?Constitutional: Awake alert and oriented x3, no associated distress.   ?Skin: no rashes, no lesions, poor skin turgor noted. ?Eyes: Pupils are equally reactive to light.  No evidence of scleral icterus or conjunctival pallor.  ?ENMT: Moist mucous membranes noted.  Posterior pharynx clear of any exudate or lesions.   ?Neck: normal, supple, no masses, no thyromegaly.  No evidence of jugular venous distension.   ?Respiratory: clear to auscultation bilaterally, no wheezing, no crackles. Normal respiratory effort. No accessory muscle use.  ?Cardiovascular: irregularly irregular rhythm with controlled rate, no murmurs / rubs / gallops. No extremity edema. 2+ pedal pulses. No carotid bruits.  ?Chest:   Nontender without crepitus or deformity.   ?Back:   Nontender without crepitus or deformity. ?Abdomen: Abdomen is soft and nontender.  No evidence of intra-abdominal masses.  Positive bowel sounds noted in all quadrants.   ?Musculoskeletal: No joint deformity upper and lower extremities. Good ROM, no contractures. Normal muscle tone.  ?Neurologic: Right facial droop noted.  Notable slurred speech on exam.  Otherwise, remainder of cranial nerves grossly intact except for diminished hearing left ear.  Sensation intact.  Patient moving all 4 extremities spontaneously.  Patient  is following all commands.  Patient is responsive to verbal stimuli.   ?Psychiatric: Patient exhibits normal mood with appropriate affect.  Patient seems to possess insight as to their current situation.   ? ?Data Reviewed: ? ?I have personally reviewed and interpreted labs, imaging. ? ?Significant findings are: ? ?Coagulation profile revealing INR 1.3, PT of 16.2.  Urinalysis unremarkable.   ?Chemistry unremarkable.   ?Noncontrast CT imaging of the head revealing age-indeterminate left occipital infarct with no acute hemorrhage.   ?CT angiogram of the head and neck revealing 9 cc area of ischemic prenumber in the left frontal operculum with moderate stenosis of the right vertebral artery origin.   ?MRI brain revealing multiple small foci of acute ischemia both cerebral and cerebellar hemispheres suggestive of emboli from a cardiac or proximal aortic source. ? ?EKG: Personally reviewed.  Rhythm is atrial fibrillation with heart rate of 100 bpm.  No dynamic ST segment changes appreciated. ? ? ?Assessment and Plan: ?* Acute cardioembolic stroke ([place]) ?ED  Imaging reveals small area of ischemic prenumbra in the left frontal operculum ?On exam patient still exhibits the following deficit: dysarthria and right facial droop ?Performing serial neurologic checks ?Monitoring patient on telemetry ?Keeping patient off of Eliquis for now ?ASA '81mg'$  daily per my discussion with Dr. Rory Percy. ?Daily statin therapy will be initiated if LDL is greater than 70 ?hemoglobin A1c 5.5 06/2021 and ?lipid panel in the morning ?Echocardiogram in the morning ?PT, OT, SLP evaluation ?Permissive hypertension with as needed antihypertensives only to be given if blood pressure greater than 220/115 ?Neurology following in consultation. ? ? ?Paroxysmal atrial fibrillation (HCC) ?Currently rate controlled without AV nodal blocking agents. ?Holding Eliquis per Neurology, last dose being evening of 5/16 per patient/son. ?Monitoring on  telemetry ? ? ?Essential hypertension ?Permissive hypertension ?PRN IV Hydralazine for SBP > 220 or DBP > 115 ? ?Metastatic squamous cell carcinoma to head and neck (HCC) ?Diagnosed 2022 ?status post left superficial parotidec

## 2021-07-22 NOTE — ED Notes (Signed)
Patient resting at this time monitor on and cycling call bell in reach and male purewick in place. Family at bedside an patient verbalize understanding of the plan of care. ?

## 2021-07-22 NOTE — ED Provider Notes (Addendum)
?[facility] ?Provider Note ? ? ?CSN: [id number] ?Arrival date & time: 07/22/21  1618 ? ?  ? ?History ? ?Chief Complaint  ?Patient presents with  ? Weakness  ? ? ?Donald Hawkins is a 88 y.o. male. ? ?HPI ? ?88 year old male with past medical history of squamous cell carcinoma with mets to head/neck status post chemo/radiation, radical neck resection presents to the emergency department after an episode of right-sided facial droop and slurred speech.  Last known normal was 9 AM this morning.  He lives at home alone.  At 2:30 PM the patient called the son and had notable slurred speech.  The son sent his wife over who noticed more pronounced than baseline right-sided facial droop (he has baseline right-sided facial droop stemming from nerve damage from resection).  The patient has a hard time describing what timeframe prior to this the symptoms started.  Possibly around noon.  Admits that when he was walking he was "having a difficult time" but denies any focal weakness in 1 arm/1 leg.  No dizziness or vision changes.  Symptoms resolved just prior to arrival. Patient currently being evaluated after 5 PM.  Currently he is baseline.  Unclear his last known normal, not a tPA candidate. ? ?Home Medications ?Prior to Admission medications   ?Medication Sig Start Date End Date Taking? Authorizing Provider  ?acetaminophen (TYLENOL) 650 MG CR tablet Take 650 mg by mouth every 8 (eight) hours as needed for pain.    [provider]  ?apixaban (ELIQUIS) 5 MG TABS tablet Take 1 tablet (5 mg total) by mouth 2 (two) times daily. 04/29/21   Fenton, Clint R, PA  ?brimonidine (ALPHAGAN) 0.2 % ophthalmic solution INSTILL 1 DROP INTO BOTH  EYES TWO TIMES DAILY 06/22/19   Bernarda Caffey, MD  ?dorzolamide-timolol (COSOPT) 22.3-6.8 MG/ML ophthalmic solution Place 1 drop into both eyes in the morning and at bedtime.    [provider]  ?ibuprofen (ADVIL) 200 MG tablet Take 400 mg by mouth  every 8 (eight) hours as needed for headache or mild pain.    [provider]  ?latanoprost (XALATAN) 0.005 % ophthalmic solution Place 1 drop into both eyes at bedtime. 12/25/19   [provider]  ?polyvinyl alcohol (LIQUIFILM TEARS) 1.4 % ophthalmic solution Place 1 drop into both eyes every 4 (four) hours as needed (for dryness).    [provider]  ?   ? ?Allergies    ?Patient has no known allergies.   ? ?Review of Systems   ?Review of Systems  ?Constitutional:  Positive for fatigue. Negative for fever.  ?Respiratory:  Negative for shortness of breath.   ?Cardiovascular:  Negative for chest pain.  ?Gastrointestinal:  Negative for abdominal pain, diarrhea and vomiting.  ?Musculoskeletal:  Negative for neck pain.  ?Skin:  Negative for rash.  ?Neurological:  Positive for facial asymmetry, speech difficulty and weakness. Negative for dizziness, syncope and headaches.  ? ?Physical Exam ?Updated Vital Signs ?BP (!) 167/84   Pulse 99   Temp 98.4 ?F (36.9 ?C) (Oral)   Resp 14   SpO2 100%  ?Physical Exam ?Vitals and nursing note reviewed.  ?Constitutional:   ?   General: He is not in acute distress. ?   Appearance: Normal appearance.  ?HENT:  ?   Head: Normocephalic.  ?   Mouth/Throat:  ?   Mouth: Mucous membranes are moist.  ?Cardiovascular:  ?   Rate and Rhythm: Normal rate.  ?Pulmonary:  ?  Effort: Pulmonary effort is normal. No respiratory distress.  ?Abdominal:  ?   Palpations: Abdomen is soft.  ?   Tenderness: There is no abdominal tenderness.  ?Skin: ?   General: Skin is warm.  ?Neurological:  ?   Mental Status: He is alert and oriented to person, place, and time. Mental status is at baseline.  ?   Comments: Mild right facial droop noted to be baseline by patient and son. Speech clear, no focal weakness or sensory change, vision baseline   ?Psychiatric:     ?   Mood and Affect: Mood normal.  ? ? ?ED Results / Procedures / Treatments   ?Labs ?(all labs ordered are listed, but only  abnormal results are displayed) ?Labs Reviewed  ?COMPREHENSIVE METABOLIC PANEL - Abnormal; Notable for the following components:  ?    Result Value  ? CO2 21 (*)   ? Glucose, Bld 120 (*)   ? Albumin 3.4 (*)   ? All other components within normal limits  ?PROTIME-INR - Abnormal; Notable for the following components:  ? Prothrombin Time 16.2 (*)   ? INR 1.3 (*)   ? All other components within normal limits  ?CBG MONITORING, ED - Abnormal; Notable for the following components:  ? Glucose-Capillary 120 (*)   ? All other components within normal limits  ?CBC  ? ? ?EKG ?EKG Interpretation ? ?Date/Time:  Wednesday Jul 22 2021 16:27:38 EDT ?Ventricular Rate:  92 ?PR Interval:    ?QRS Duration: 124 ?QT Interval:  381 ?QTC Calculation: 472 ?R Axis:   85 ?Text Interpretation: Atrial fibrillation Right bundle branch block ST depr, consider ischemia, inferior leads Similar to previous Confirmed by Lavenia Atlas [id number]) on 07/22/2021 4:34:33 PM ? ?Radiology ?No results found. ? ?Procedures ?Marland KitchenCritical Care ?Performed by: Lorelle Gibbs, DO ?Authorized by: Lorelle Gibbs, DO  ? ?Critical care provider statement:  ?  Critical care time (minutes):  70 ?  Critical care time was exclusive of:  Separately billable procedures and treating other patients ?  Critical care was necessary to treat or prevent imminent or life-threatening deterioration of the following conditions:  CNS failure or compromise ?  Critical care was time spent personally by me on the following activities:  Development of treatment plan with patient or surrogate, discussions with consultants, evaluation of patient's response to treatment, examination of patient, ordering and review of laboratory studies, ordering and review of radiographic studies, ordering and performing treatments and interventions, pulse oximetry, re-evaluation of patient's condition and review of old charts ?  I assumed direction of critical care for this patient from another provider in my  specialty: no   ?  Care discussed with: admitting provider    ? ? ?Medications Ordered in ED ?Medications - No data to display ? ?ED Course/ Medical Decision Making/ A&P ?  ?                        ?Medical Decision Making ?Amount and/or Complexity of Data Reviewed ?Labs: ordered. ?Radiology: ordered. ? ?Risk ?Decision regarding hospitalization. ? ? ?88 year old male presents emergency department after an episode of worsening right-sided facial droop and slurred speech.  Last known normal 9 AM.  Patient arriving with resolved symptoms, out of the tPA window, anticoagulated on Eliquis.  History of squamous cell carcinoma, could be concern for possible metastatic disease as well.  Plan for MRI imaging, with and without contrast.  Metabolic work-up is basleine/reassuring. ? ?1918: Called to bedside  by family member with onset of right-sided facial droop and slurred speech.  Patient has worsened right-sided facial droop with slurred speech.  No vision change, no aphasia, slight right hand grip strength weakness, approximately NIH of 3. Code stroke activated.  ? ?Neurology team came to bedside, Dr. Rory Percy and Dr. Cheral Marker evaluated that patient. Took him for emergent CT head/CTA.  He was found to have a left frontal perfusion deficit with other abnormal/subacute findings, no LVO.  Plan to admit for stroke work-up, on reevaluation symptoms persisting.  Patient admitted to the hospitalist team with neurology consulting. ? ? ? ? ?Final Clinical Impression(s) / ED Diagnoses ?Final diagnoses:  ?None  ? ? ?Rx / DC Orders ?ED Discharge Orders   ? ? None  ? ?  ? ? ?  ?Lorelle Gibbs, DO ?07/22/21 2240 ? ?  ?Lorelle Gibbs, DO ?07/22/21 2241 ? ?

## 2021-07-22 NOTE — Assessment & Plan Note (Addendum)
?   Currently rate controlled without AV nodal blocking agents. ?? Holding Eliquis per Neurology, last dose being evening of 5/16 per patient/son. ?? Monitoring on telemetry ? ?

## 2021-07-22 NOTE — Assessment & Plan Note (Signed)
?   Diagnosed 2022 ?? status post left superficial parotidectomy and a modified radical neck dissection completed on January 26, 2021 ?? S/P radiation therapy with Dr. Isidore Moos ?? Continue outpatient follow up with Dr. Alen Blew ?

## 2021-07-22 NOTE — Assessment & Plan Note (Signed)
?   Permissive hypertension ?? PRN IV Hydralazine for SBP > 220 or DBP > 115 ?

## 2021-07-22 NOTE — Telephone Encounter (Signed)
? ?  Pre-operative Risk Assessment  ?  ?Patient Name: Donald Hawkins  ?DOB: 12/30/1928 ?MRN: [id number]  ? ?  ? ?Request for Surgical Clearance   ? ?Procedure:   OPEN RIGHT INGUINAL HERNIA REPAIR ? ?Date of Surgery:  Clearance TBD                              ?   ?Surgeon:  DR. Ralene Ok ?Surgeon's Group or Practice Name:  [facility] ?Phone number:  [phone] ?Fax number:  [phone] ATTN: Carlene Coria, CMA OR Illene Regulus, CMA (BOTH NAMES ARE LISTED ON CLEARANCE FORM) ?  ?Type of Clearance Requested:   ?- Medical  ?- Pharmacy:  Hold Apixaban (Eliquis)   ?  ?Type of Anesthesia:  General  ?  ?Additional requests/questions:   ? ?Signed, ?Julaine Hua   ?07/22/2021, 12:46 PM  ? ?

## 2021-07-22 NOTE — ED Notes (Signed)
Patient brought to MRI at this time promptly after code stroke CT scans per Neurologist at bedside.  ?

## 2021-07-22 NOTE — ED Notes (Signed)
Neurologist at bedside at this time to evaluate the patient ?

## 2021-07-22 NOTE — ED Notes (Signed)
Provider reports that he is aware of the patient's symptoms at this time ?

## 2021-07-22 NOTE — Assessment & Plan Note (Signed)
?   Home regimen of eye drops resumed. ?

## 2021-07-22 NOTE — Progress Notes (Signed)
INTERIM PROGRESS NOTE ? ?Patient seen by Dr. Cheral Marker about an hour ago.  Concern for left hemispheric TIA/small stroke.  Code stroke activated in the ER due to concern for worsening right-sided facial droop and dysarthria.  Dr. Yvetta Coder earlier exam did notice some right facial weakness and mild dysarthria.  Patient was never back to an NIH of 0 hence his last known well remains from earlier in the morning as documented in Dr. Yvetta Coder note. ?Also he is on Eliquis-last dose last night. ?Not a candidate for TNKase ?Stat head CT with performed and reviewed-no acute changes ?CT angiography head and neck with no proximal emergent LVO, nonocclusive stenosis in bilateral carotids in the neck at the bifurcation. ?CT perfusion study with a small 9 cc penumbra with no core-likely the culprit for his symptoms. ?Sending for stat MRI. ?Needs full stroke work-up ?Stroke team to follow ? ?-- ?Amie Portland, MD ?Neurologist ?[facility] ?Pager: [phone] ? ?No charge note ? ? ?Addendum ?MRI brain completed-scattered multifocal embolic looking infarcts in bilateral anterior and posterior circulations. ?Likely cardioembolic ?Hold Eliquis for now. ASA 81 should be OK ?Discussed with admitting MD and EDP ?Full stroke work-up-stroke team to follow. ? ?-- ?Amie Portland, MD ?Neurologist ?[facility] ?Pager: [phone] ? ? ? ?

## 2021-07-22 NOTE — ED Triage Notes (Signed)
Per EMS patient had a LKN of 0900 this am per their conversation with the son. Patient per ems was having notable right sided facial droop worse than usual and right sided weakness/ both which resolved en route to the hospital. Patient alert and oriented upon arrival and has no further deficits other than baseline.  ?

## 2021-07-22 NOTE — Consult Note (Signed)
?                    NEURO HOSPITALIST CONSULT NOTE  ? ?Requestig physician: Dr. Dina Rich ? ?Reason for Consult: TIA symptoms of right facial droop and slurred speech ? ?History obtained from:  Patient, Son and Chart    ? ?HPI:                                                                                                                                         ? Donald Hawkins is an 88 y.o. male with a PMHx of metastatic squamous cell cancer of the left neck s/p radical neck dissection and parotidectomy followed by radiation therapy, inguinal hernia, HTN and depression who presented to the ED this afternoon for evaluation of worsening right facial droop relative to his post-surgery baseline, new onset of dysarthria and BLE weakness which resolved en route to the hospital. LKN was 0900. Symptoms first noted by son at 2:30 PM when he was called by the patient and noted the slurred speech. Son sent his wife over who then noticed that the patient's baseline right sided facial droop had worsened relative to his baseline. Patient also endorsed that he was having difficulty walking today, which was also new.  ? ?Past Medical History:  ?Diagnosis Date  ? Cancer [facility])   ? skin top of head - squamous and basal. "Squamous on left neck"  ? Depression   ? Hernia, inguinal   ? Hypertension   ? ? ?Past Surgical History:  ?Procedure Laterality Date  ? ADJACENT TISSUE TRANSFER/TISSUE REARRANGEMENT  01/26/2021  ? Procedure: Tissue rearrangement Left neck 60 sq cm.;  Surgeon: Melida Quitter, MD;  Location: [place];  Service: ENT;;  ? CATARACT EXTRACTION    ? EYE SURGERY    ? NASAL SEPTUM SURGERY    ? 40 yrs ago  ? PAROTIDECTOMY Left 01/26/2021  ? Procedure: LEFT PAROTIDECTOMY;  Surgeon: Melida Quitter, MD;  Location: [place];  Service: ENT;  Laterality: Left;  ? RADICAL NECK DISSECTION Left 01/26/2021  ? Procedure: MODIFIED RADICAL NECK DISSECTION;  Surgeon: Melida Quitter, MD;  Location: [place];  Service: ENT;  Laterality: Left;  ? ? ?Family  History  ?Problem Relation Age of Onset  ? Emphysema Mother   ? Cancer Father   ?           ? ?Social History:  reports that he has never smoked. He has never used smokeless tobacco. He reports that he does not currently use alcohol after a past usage of about 1.0 standard drink per week. He reports that he does not use drugs. ? ?No Known Allergies ? ?HOME MEDICATIONS:                                                                                                                     ? ?  No current facility-administered medications on file prior to encounter.  ? ?Current Outpatient Medications on File Prior to Encounter  ?Medication Sig Dispense Refill  ? acetaminophen (TYLENOL) 650 MG CR tablet Take 650 mg by mouth every 8 (eight) hours as needed for pain.    ? apixaban (ELIQUIS) 5 MG TABS tablet Take 1 tablet (5 mg total) by mouth 2 (two) times daily. 180 tablet 2  ? brimonidine (ALPHAGAN) 0.2 % ophthalmic solution INSTILL 1 DROP INTO BOTH  EYES TWO TIMES DAILY 30 mL 3  ? dorzolamide-timolol (COSOPT) 22.3-6.8 MG/ML ophthalmic solution Place 1 drop into both eyes in the morning and at bedtime.    ? ibuprofen (ADVIL) 200 MG tablet Take 400 mg by mouth every 8 (eight) hours as needed for headache or mild pain.    ? latanoprost (XALATAN) 0.005 % ophthalmic solution Place 1 drop into both eyes at bedtime.    ? polyvinyl alcohol (LIQUIFILM TEARS) 1.4 % ophthalmic solution Place 1 drop into both eyes every 4 (four) hours as needed (for dryness).    ? ? ? ?ROS:                                                                                                                                       ?As per HPI. Does not endorse any additional neurological complaints including no dizziness, no confusion, no acute vision loss, no upper extremity weakness and no ataxia.  ? ? ?Blood pressure (!) 174/92, pulse 100, temperature 98.4 ?F (36.9 ?C), temperature source Oral, resp. rate 14, SpO2 99 %. ? ? ?General Examination:                                                                                                       ? ?Physical Exam  ?HEENT-  Normocephalic. Left neck and jaw with sequelae of prior surgery and radiation treatment.    ?Lungs- Respirations unlabored ?Extremities- No edema ? ?Neurological Examination ?Mental Status: Awake and alert. Oriented x 5. Expressed thought content is appropriate. Pleasant and cooperative. Affect euthymic. Speech fluent with intact comprehension and naming. Subtle dysarthria is manifest at times.   ?Cranial Nerves: ?II: Temporal visual fields intact with no extinction to DSS. PERRL ?III,IV, VI: No ptosis. EOMI. No nystagmus.  ?V: Bilateral V1 and V2 distributions with symmetric temp sensation. Left V3 distribution with decreased temp sensation compared to the right.  ?VII: Mild left sided droop is chronic since radiation treatment per son. There is also some right  lower lip prominence on exam that patient states is chronic since radiation treatment.  ?VIII: HOH ?IX,X: Palate rises symmetrically ?XI: Symmetric shoulder shrug ?XII: Midline tongue extension ?Motor: ?RUE 5/5 proximally and distally ?RLE 5/5 proximally and distally ?LUE 5/5 deltoid, 4/5 biceps and triceps, 4/5 grip - patient states LUE has been chronically weaker than the right since elbow fracture last year ?LLE 5/5 proximally and distally ?Sensory: Temp and light touch intact throughout, bilaterally. No extinction to DSS.  ?Deep Tendon Reflexes: 1+ right biceps and brachioradialis, trace left biceps and brachioradialis. 1+ and symmetric patellae.  ?Plantars: Toes mute bilaterally  ?Cerebellar: No ataxia with FNF bilaterally  ?Gait: Deferred ?  ?Lab Results: ?Basic Metabolic Panel: ?Recent Labs  ?Lab 07/22/21 ?1632  ?NA 136  ?K 4.0  ?CL 105  ?CO2 21*  ?GLUCOSE 120*  ?BUN 10  ?CREATININE 0.82  ?CALCIUM 9.3  ? ? ?CBC: ?Recent Labs  ?Lab 07/22/21 ?1632  ?WBC 6.4  ?HGB 13.9  ?HCT 42.3  ?MCV 98.8  ?PLT 155  ? ? ?Cardiac Enzymes: ?No results for  input(s): CKTOTAL, CKMB, CKMBINDEX, TROPONINI in the last 168 hours. ? ?Lipid Panel: ?No results for input(s): CHOL, TRIG, HDL, CHOLHDL, VLDL, LDLCALC in the last 168 hours. ? ?Imaging: ?No results found. ? ? ?Assessment: 88 year old male presenting with TIA symptoms of right facial droop and slurred speech.  ?1. Exam reveals mild right facial droop in lower quadrant and subtle intermittent dysarthria. Left upper extremity weakness is chronic since left elbow fracture, but without drift. NIHSS of 2.  ?2. Overall presentation is most consistent with a TIA. Suspect possible radiation-induced left ICA stenosis given his surgical oncology history (see HPI). Unlikely to be a hemorrhage given transient symptoms and no headache.  ?3. On Eliquis for atrial fibrillation. This was started in November after his neck surgery, due to a new diagnosis of atrial fibrillation. He has been compliant. However, he missed today's AM dose due to feeling unwell, stating to son that he "forgot all about it" due to the symptoms he was having.  ? ?Recommendations: ?1. HgbA1c, fasting lipid panel ?2. MRI of the brain without contrast ?3. PT consult, OT consult, Speech consult ?4. Echocardiogram ?5. CTA of head and neck ?6. Continue Eliquis this evening if brain imaging confirms no ICH.   ?7. Risk factor modification ?8. Telemetry monitoring ?9. Frequent neuro checks ?10. NPO until passes stroke swallow screen ?11. Modified permissive HTN protocol given advanced age. Treat SBP if > 180.  ? ?  ?Electronically signed: Dr. Kerney Elbe ?07/22/2021, 6:59 PM ? ? ?   ?

## 2021-07-22 NOTE — ED Notes (Signed)
Patient in MRI at this time. 

## 2021-07-22 NOTE — Assessment & Plan Note (Addendum)
?   ED Imaging reveals small area of ischemic prenumbra in the left frontal operculum ?? On exam patient still exhibits the following deficit: dysarthria and right facial droop ?? Performing serial neurologic checks ?? Monitoring patient on telemetry ?? Keeping patient off of Eliquis for now ?? ASA '81mg'$  daily per my discussion with Dr. Rory Percy. ?? Daily statin therapy will be initiated if LDL is greater than 70 ?? hemoglobin A1c 5.5 06/2021 and ?? lipid panel in the morning ?? Echocardiogram in the morning ?? PT, OT, SLP evaluation ?? Permissive hypertension with as needed antihypertensives only to be given if blood pressure greater than 220/115 ?? Neurology following in consultation. ? ?

## 2021-07-22 NOTE — ED Notes (Signed)
Provider notified of the patient's neurological changes as documented in the [facility] ?

## 2021-07-22 NOTE — ED Notes (Signed)
Assisted to the restroom at this time ?

## 2021-07-22 NOTE — Assessment & Plan Note (Signed)
?   On Chronic Eliquis for atrial fibrillation, CHADSVASC score of 3.  ?? Holding for now per Neurology recommendations  ?

## 2021-07-23 ENCOUNTER — Encounter (HOSPITAL_COMMUNITY): Payer: Self-pay | Admitting: Internal Medicine

## 2021-07-23 ENCOUNTER — Inpatient Hospital Stay (HOSPITAL_COMMUNITY): Payer: Medicare Other

## 2021-07-23 ENCOUNTER — Other Ambulatory Visit (HOSPITAL_COMMUNITY): Payer: Self-pay

## 2021-07-23 DIAGNOSIS — E43 Unspecified severe protein-calorie malnutrition: Secondary | ICD-10-CM | POA: Insufficient documentation

## 2021-07-23 DIAGNOSIS — I10 Essential (primary) hypertension: Secondary | ICD-10-CM | POA: Diagnosis not present

## 2021-07-23 DIAGNOSIS — I6389 Other cerebral infarction: Secondary | ICD-10-CM | POA: Diagnosis not present

## 2021-07-23 DIAGNOSIS — I480 Paroxysmal atrial fibrillation: Secondary | ICD-10-CM | POA: Diagnosis not present

## 2021-07-23 DIAGNOSIS — I639 Cerebral infarction, unspecified: Secondary | ICD-10-CM | POA: Diagnosis not present

## 2021-07-23 LAB — CBC WITH DIFFERENTIAL/PLATELET
Abs Immature Granulocytes: 0.02 10*3/uL (ref 0.00–0.07)
Basophils Absolute: 0 10*3/uL (ref 0.0–0.1)
Basophils Relative: 1 %
Eosinophils Absolute: 0.1 10*3/uL (ref 0.0–0.5)
Eosinophils Relative: 2 %
HCT: 38.7 % — ABNORMAL LOW (ref 39.0–52.0)
Hemoglobin: 13.6 g/dL (ref 13.0–17.0)
Immature Granulocytes: 0 %
Lymphocytes Relative: 24 %
Lymphs Abs: 1.5 10*3/uL (ref 0.7–4.0)
MCH: 32.7 pg (ref 26.0–34.0)
MCHC: 35.1 g/dL (ref 30.0–36.0)
MCV: 93 fL (ref 80.0–100.0)
Monocytes Absolute: 0.9 10*3/uL (ref 0.1–1.0)
Monocytes Relative: 14 %
Neutro Abs: 3.7 10*3/uL (ref 1.7–7.7)
Neutrophils Relative %: 59 %
Platelets: 136 10*3/uL — ABNORMAL LOW (ref 150–400)
RBC: 4.16 MIL/uL — ABNORMAL LOW (ref 4.22–5.81)
RDW: 13.2 % (ref 11.5–15.5)
WBC: 6.3 10*3/uL (ref 4.0–10.5)
nRBC: 0 % (ref 0.0–0.2)

## 2021-07-23 LAB — ECHOCARDIOGRAM COMPLETE BUBBLE STUDY
AR max vel: 2.75 cm2
AV Area VTI: 2.7 cm2
AV Area mean vel: 3.1 cm2
AV Mean grad: 9 mmHg
AV Peak grad: 18.4 mmHg
Ao pk vel: 2.15 m/s
Area-P 1/2: 2.42 cm2
Calc EF: 64.4 %
MV VTI: 5.21 cm2
S' Lateral: 2.8 cm
Single Plane A2C EF: 59.6 %
Single Plane A4C EF: 67.7 %

## 2021-07-23 LAB — COMPREHENSIVE METABOLIC PANEL
ALT: 11 U/L (ref 0–44)
AST: 21 U/L (ref 15–41)
Albumin: 3 g/dL — ABNORMAL LOW (ref 3.5–5.0)
Alkaline Phosphatase: 70 U/L (ref 38–126)
Anion gap: 4 — ABNORMAL LOW (ref 5–15)
BUN: 8 mg/dL (ref 8–23)
CO2: 24 mmol/L (ref 22–32)
Calcium: 9 mg/dL (ref 8.9–10.3)
Chloride: 110 mmol/L (ref 98–111)
Creatinine, Ser: 0.86 mg/dL (ref 0.61–1.24)
GFR, Estimated: >60 mL/min (ref >60)
Glucose, Bld: 93 mg/dL (ref 70–99)
Potassium: 3.6 mmol/L (ref 3.5–5.1)
Sodium: 138 mmol/L (ref 135–145)
Total Bilirubin: 1 mg/dL (ref 0.3–1.2)
Total Protein: 6.3 g/dL — ABNORMAL LOW (ref 6.5–8.1)

## 2021-07-23 LAB — LIPID PANEL
Cholesterol: 134 mg/dL (ref 0–200)
HDL: 51 mg/dL (ref >40)
LDL Cholesterol: 74 mg/dL (ref 0–99)
Total CHOL/HDL Ratio: 2.6 RATIO
Triglycerides: 43 mg/dL (ref <150)
VLDL: 9 mg/dL (ref 0–40)

## 2021-07-23 LAB — ETHANOL: Alcohol, Ethyl (B): <10 mg/dL (ref <10)

## 2021-07-23 LAB — APTT: aPTT: 35 seconds (ref 24–36)

## 2021-07-23 LAB — MAGNESIUM: Magnesium: 1.9 mg/dL (ref 1.7–2.4)

## 2021-07-23 MED ORDER — ROSUVASTATIN CALCIUM 5 MG PO TABS
10.0000 mg | ORAL_TABLET | Freq: Every day | ORAL | Status: DC
  Administered 2021-07-23 – 2021-07-29 (×6): 10 mg via ORAL
  Filled 2021-07-23 (×6): qty 2

## 2021-07-23 MED ORDER — DABIGATRAN ETEXILATE MESYLATE 150 MG PO CAPS
150.0000 mg | ORAL_CAPSULE | Freq: Two times a day (BID) | ORAL | Status: DC
Start: 2021-07-23 — End: 2021-07-29
  Administered 2021-07-23 – 2021-07-29 (×11): 150 mg via ORAL
  Filled 2021-07-23 (×14): qty 1

## 2021-07-23 MED ORDER — SODIUM CHLORIDE 0.9 % IV SOLN: INTRAVENOUS | Status: AC

## 2021-07-23 NOTE — Progress Notes (Signed)
? ?  Inpatient Rehab Admissions Coordinator : ? ?Per therapy recommendations, patient was screened for CIR candidacy by Aneeka Bowden RN MSN.  At this time patient appears to be a potential candidate for CIR. I will place a rehab consult per protocol for full assessment. Please call me with any questions. ? ?Jalayna Josten RN MSN ?Admissions Coordinator ?[phone] ?  ?

## 2021-07-23 NOTE — TOC CAGE-AID Note (Signed)
Transition of Care [facility]) - CAGE-AID Screening   Patient Details  Name: Donald Hawkins MRN: [id number] Date of Birth: August 28, 1928  [facility]) CM/SW Contact:    Paullette Mckain C Tarpley-Carter, Webb Phone Number: 07/23/2021, 9:53 AM   Clinical Narrative: Pt participated in [place].  Pt stated he does not use substance or ETOH.  Pt was not offered resources, due to no usage of substance or ETOH.     Mohan Erven Tarpley-Carter, MSW, LCSW-A Pronouns:  She/Her/Hers [facility] Clinical Social Worker Direct Number:  [phone] [email or web address]   CAGE-AID Screening:    Have You Ever Felt You Ought to Cut Down on Your Drinking or Drug Use?: No Have People Annoyed You By [employer] Your Drinking Or Drug Use?: No Have You Felt Bad Or Guilty About Your Drinking Or Drug Use?: No Have You Ever Had a Drink or Used Drugs First Thing In The Morning to Steady Your Nerves or to Get Rid of a Hangover?: No CAGE-AID Score: 0  Substance Abuse Education Offered: No

## 2021-07-23 NOTE — Progress Notes (Signed)
  Transition of Care [facility]) Screening Note   Patient Details  Name: Donald Hawkins Date of Birth: [birth year]   Transition of Care [facility]) CM/SW Contact:    Cyndi Bender, RN Phone Number: 07/23/2021, 8:13 AM    Transition of Care Department [facility]) has reviewed patient and no TOC needs have been identified at this time. We will continue to monitor patient advancement through interdisciplinary progression rounds. If new patient transition needs arise, please place a TOC consult.

## 2021-07-23 NOTE — Evaluation (Signed)
Physical Therapy Evaluation Patient Details Name: Donald Hawkins MRN: [id number] DOB: [birth year] Today's Date: 07/23/2021  History of Present Illness  Pt is a 88 year old man admitted with R side weakness, dysarthria and R facial droop on 07/22/21. MRI+ multiple small foci infarcts in B cerebrum and cerebellum. 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  Clinical Impression  Pt admitted with above diagnosis. Pt was able to ambulate but needing mod assist of 2 persons for safety as his right knee buckles, pt with decr safety awareness and fatigues as well. Pt will need Rehab prior to going home with supportive family.  They may not be able to hire 24 hour care.  They are checking into incr care as they had a little care for him before admit.   Pt currently with functional limitations due to the deficits listed below (see PT Problem List). Pt will benefit from skilled PT to increase their independence and safety with mobility to allow discharge to the venue listed below.          Recommendations for follow up therapy are one component of a multi-disciplinary discharge planning process, led by the attending physician.  Recommendations may be updated based on patient status, additional functional criteria and insurance authorization.  Follow Up Recommendations Acute inpatient rehab (3hours/day)    Assistance Recommended at Discharge Frequent or constant Supervision/Assistance  Patient can return home with the following  A lot of help with bathing/dressing/bathroom;Two people to help with walking and/or transfers;Assist for transportation;Help with stairs or ramp for entrance    Equipment Recommendations Rolling walker (2 wheels)  Recommendations for Other Services  Rehab consult    Functional Status Assessment Patient has had a recent decline in their functional status and demonstrates the ability to make significant improvements in function  in a reasonable and predictable amount of time.     Precautions / Restrictions Precautions Precautions: Fall Restrictions Weight Bearing Restrictions: No      Mobility  Bed Mobility Overal bed mobility: Needs Assistance Bed Mobility: Supine to Sit     Supine to sit: Min assist     General bed mobility comments: moderate verbal cues, min assist to raise trunk, increased time and effort    Transfers Overall transfer level: Needs assistance Equipment used: Rolling walker (2 wheels) Transfers: Sit to/from Stand Sit to Stand: Mod assist, +2 safety/equipment           General transfer comment: assist to rise and steady    Ambulation/Gait Ambulation/Gait assistance: Mod assist, +2 physical assistance Gait Distance (Feet): 20 Feet Assistive device: Rolling walker (2 wheels) Gait Pattern/deviations: Step-through pattern, Decreased stride length, Decreased step length - right, Knee flexed in stance - right, Knees buckling, Shuffle, Ataxic, Drifts right/left, Trunk flexed   Gait velocity interpretation: <1.31 ft/sec, indicative of household ambulator   General Gait Details: Pt was able to ambulate but needing a lot of support and cues. Pt relies on UE support, needed assist with sequencing steps and RW.  Pt with right knee buckling as he fatigues. Also loses upright posture as he fatigues.  Stairs            Wheelchair Mobility    Modified Rankin (Stroke Patients Only) Modified Rankin (Stroke Patients Only) Pre-Morbid Rankin Score: Moderate disability Modified Rankin: Moderately severe disability     Balance Overall balance assessment: Needs assistance   Sitting balance-Leahy Scale: Poor Sitting balance - Comments: LOB when donning  L sock   Standing balance support: Single extremity supported Standing balance-Leahy Scale: Poor Standing balance comment: requires moderate assistance and at least one hand support in static standing at sink.  Pt loses upright  posture and flexes right LE seemingly unaware.                             Pertinent Vitals/Pain Pain Assessment Pain Assessment: No/denies pain Faces Pain Scale: No hurt    Home Living Family/patient expects to be discharged to:: Private residence Living Arrangements: Alone Available Help at Discharge: Family;Available PRN/intermittently;Personal care attendant Type of Home: House Home Access: Stairs to enter Entrance Stairs-Rails: Psychiatric nurse of Steps: 5   Home Layout: One level Home Equipment: Wheelchair - manual;Cane - single point;Standard Walker;BSC/3in1;Grab bars - tub/shower      Prior Function Prior Level of Function : Needs assist             Mobility Comments: walks 50 feet with standard walker, increased time with slow shuffling steps per son ADLs Comments: sponge bathed, assisted for meals and housekeeping     Hand Dominance   Dominant Hand: Right    Extremity/Trunk Assessment   Upper Extremity Assessment Upper Extremity Assessment: Defer to OT evaluation RUE Deficits / Details: +drift with vision occluded RUE Sensation: decreased proprioception    Lower Extremity Assessment Lower Extremity Assessment: RLE deficits/detail RLE Deficits / Details: grossly 3/5 RLE Sensation: decreased proprioception RLE Coordination: decreased fine motor    Cervical / Trunk Assessment Cervical / Trunk Assessment: Kyphotic  Communication   Communication: HOH;Other (comment) (aids are at home)  Cognition Arousal/Alertness: Awake/alert Behavior During Therapy: WFL for tasks assessed/performed Overall Cognitive Status: Difficult to assess Area of Impairment: Problem solving, Safety/judgement, Following commands                       Following Commands: Follows one step commands with increased time (and multimodal cues) Safety/Judgement: Decreased awareness of safety, Decreased awareness of deficits   Problem Solving: Slow  processing, Requires verbal cues General Comments: pt unaware when he began to drift R and flex knees in standing and need to sit with short distance ambulation        General Comments General comments (skin integrity, edema, etc.): VSS    Exercises     Assessment/Plan    PT Assessment Patient needs continued PT services  PT Problem List Decreased activity tolerance;Decreased balance;Decreased mobility;Decreased knowledge of use of DME;Decreased coordination;Decreased strength;Decreased safety awareness;Decreased knowledge of precautions       PT Treatment Interventions DME instruction;Gait training;Functional mobility training;Therapeutic activities;Therapeutic exercise;Balance training;Patient/family education;Stair training    PT Goals (Current goals can be found in the Care Plan section)  Acute Rehab PT Goals Patient Stated Goal: to go to rehab PT Goal Formulation: With patient Time For Goal Achievement: 08/06/21 Potential to Achieve Goals: Good    Frequency Min 4X/week     Co-evaluation PT/OT/SLP Co-Evaluation/Treatment: Yes Reason for Co-Treatment: Complexity of the patient's impairments (multi-system involvement);For patient/therapist safety PT goals addressed during session: Mobility/safety with mobility OT goals addressed during session: ADL's and self-care       AM-PAC PT "6 Clicks" Mobility  Outcome Measure Help needed turning from your back to your side while in a flat bed without using bedrails?: None Help needed moving from lying on your back to sitting on the side of a flat bed without using bedrails?: A Little Help needed moving  to and from a bed to a chair (including a wheelchair)?: Total Help needed standing up from a chair using your arms (e.g., wheelchair or bedside chair)?: Total Help needed to walk in hospital room?: Total Help needed climbing 3-5 steps with a railing? : Total 6 Click Score: 11    End of Session Equipment Utilized During  Treatment: Gait belt Activity Tolerance: Patient limited by fatigue Patient left: in chair;with call bell/phone within reach;with chair alarm set;with family/visitor present Nurse Communication: Mobility status PT Visit Diagnosis: Unsteadiness on feet (R26.81);Muscle weakness (generalized) (M62.81);Hemiplegia and hemiparesis Hemiplegia - Right/Left: Right Hemiplegia - dominant/non-dominant: Dominant Hemiplegia - caused by: Cerebral infarction    Time: 1030-1105 PT Time Calculation (min) (ACUTE ONLY): 35 min   Charges:   PT Evaluation $PT Eval Moderate Complexity: 1 Mod          Zalyn Amend M,PT Acute Rehab Services [phone] (pager)   Alvira Philips 07/23/2021, 2:06 PM

## 2021-07-23 NOTE — Telephone Encounter (Signed)
   Name: Donald Hawkins  DOB: 1929/03/02  MRN: [id number]  Primary Cardiologist: None - followed by afib clinic only  Chart reviewed as part of pre-operative protocol coverage. Because of Donald Hawkins's past medical history and time since last visit, he will require a follow-up in-office visit in order to better assess preoperative cardiovascular risk.   Patient has only been seen by afib clinic, not by general cardiology before, so needs new patient appointment. Furthermore, patient is actively admitted with a stroke CURRENTLY so needs visit following discharge. He is NOT cleared to proceed or hold anticoagulation until assessed in the office.  Pre-op covering staff: - Please schedule appointment and call patient to inform them.  - Please contact requesting surgeon's office via preferred method (i.e, phone, fax) to inform them of need for appointment prior to surgery.  I will tentatively route to pharm for input on holding Eliquis but the ultimate timing of holding will likely need to be determined by MD seeing patient in follow-up visit.  Donald Pitter, PA-C  07/23/2021, 12:04 PM

## 2021-07-23 NOTE — Evaluation (Signed)
Speech Language Pathology Evaluation Patient Details Name: Donald Hawkins MRN: [id number] DOB: 1928-06-09 Today's Date: 07/23/2021 Time: 1140-1200 SLP Time Calculation (min) (ACUTE ONLY): 20 min  Problem List:  Patient Active Problem List   Diagnosis Date Noted   Acute cardioembolic stroke ([place]) 08/67/6195   Essential hypertension 07/22/2021   Glaucoma 09/32/6712   Acute embolic stroke ([place]) 45/80/9983   Neoplasm of uncertain behavior of skin 06/10/2021   Imbalance 06/08/2021   Left ear hearing loss 06/08/2021   Encounter for preoperative dental examination 03/20/2021   Long term current use of anticoagulant therapy 03/20/2021   Teeth missing 03/20/2021   Accretions on teeth 03/20/2021   Defective dental restoration 03/20/2021   Gingival recession, generalized 03/20/2021   Clinical xerostomia 03/20/2021   Epistaxis 03/12/2021   Secondary malignant neoplasm of cervical lymph node ([place]) 02/25/2021   Metastatic squamous cell carcinoma to head and neck ([place]) 01/26/2021   Paroxysmal atrial fibrillation ([place]) 01/22/2021   Secondary hypercoagulable state ([place]) 01/22/2021   Primary squamous cell carcinoma of left ear 01/14/2021   Past Medical History:  Past Medical History:  Diagnosis Date   Cancer ([place])    skin top of head - squamous and basal. "Squamous on left neck"   Depression    Hernia, inguinal    Hypertension    Past Surgical History:  Past Surgical History:  Procedure Laterality Date   ADJACENT TISSUE TRANSFER/TISSUE REARRANGEMENT  01/26/2021   Procedure: Tissue rearrangement Left neck 60 sq cm.;  Surgeon: Melida Quitter, MD;  Location: [facility] OR;  Service: ENT;;   CATARACT EXTRACTION     EYE SURGERY     NASAL SEPTUM SURGERY     40 yrs ago   PAROTIDECTOMY Left 01/26/2021   Procedure: LEFT PAROTIDECTOMY;  Surgeon: Melida Quitter, MD;  Location: [facility];  Service: ENT;  Laterality: Left;   RADICAL NECK DISSECTION Left 01/26/2021   Procedure: MODIFIED RADICAL NECK DISSECTION;   Surgeon: Melida Quitter, MD;  Location: [facility] OR;  Service: ENT;  Laterality: Left;   HPI:  88 year old male with past medical history of 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 Per MRI of head with multiple small foci of acute ischemia within both cerebral and  cerebellar hemispheres and Multiple old small vessel infarcts and findings of chronic ischemic microangiopathy.   Assessment / Plan / Recommendation Clinical Impression  Pt presents with novel deficits in speech, language, and cognition post CVA including dysarthria of speech, cognitive deficits, and some anomic aphasia. Pt was oriented x4. Family assisted to provide some PLOF. Pt was living at home with assistance from son and personal care attendant for complex ADLs. Deficits in cognition per informal assessments included possible left neglect (exhibited during clock drawing/coordinate clinincally during future interactions). Pt with reduced executive function skills (self monitoring and planning). Pt is very HOH which likely exacerbates deficits listed above. Pt also exhibited dysarthria of speech c/b reduced articulatory precision and decreased breath support for speech impairing vocal intensity which ulitimately led to reduced intelligibility of speech. Pt also exhibited some instances of anomic aphasia, paraphasias, but remained largely fluent with speech. SLP to follow up for cognitive linguistic recovery.    SLP Assessment  SLP Recommendation/Assessment: Patient needs continued Speech Lanaguage  Pathology Services SLP Visit Diagnosis: Cognitive communication deficit (R41.841);Dysarthria  and anarthria (R47.1);Aphasia (R47.01)    Recommendations for follow up therapy are one component of a multi-disciplinary discharge planning process, led by the attending physician.  Recommendations may be updated based on patient status, additional functional criteria and insurance authorization.    Follow Up Recommendations  Acute inpatient rehab (3hours/day)    Assistance Recommended at Discharge  Frequent or constant Supervision/Assistance  Functional Status Assessment Patient has had a recent decline in their functional status and demonstrates the ability to make significant improvements in function in a reasonable and predictable amount of time.  Frequency and Duration min 2x/week  2 weeks      SLP Evaluation Cognition  Overall Cognitive Status: Impaired/Different from baseline Orientation Level: Oriented X4 Attention: Focused;Sustained Focused Attention: Impaired Focused Attention Impairment: Verbal complex;Functional complex Sustained Attention: Impaired Sustained Attention Impairment: Verbal complex;Functional complex Memory: Impaired Memory Impairment: Decreased recall of new information;Decreased short term memory Decreased Short Term Memory: Verbal complex;Functional complex Awareness: Impaired Problem Solving: Impaired Problem Solving Impairment: Verbal complex;Functional complex Executive Function: Organizing;Decision Soil scientist: Impaired Organizing Impairment: Verbal complex;Functional complex Decision Making: Impaired Decision Making Impairment: Verbal complex;Functional complex Self Monitoring: Impaired Self Monitoring Impairment: Verbal complex;Functional complex Safety/Judgment: Impaired       Comprehension  Auditory Comprehension Overall Auditory Comprehension: Other (comment) (very HOH at baseline) Visual Recognition/Discrimination Discrimination:  (hx of glaucoma with R eye deficits)    Expression Expression Primary Mode of  Expression: Verbal Verbal Expression Overall Verbal Expression: Impaired Repetition: No impairment Naming: Impairment Verbal Errors: Aware of errors;Semantic paraphasias Pragmatics: No impairment Interfering Components: Speech intelligibility Effective Techniques: Open ended questions Written Expression Dominant Hand: Right   Oral / Motor  Oral Motor/Sensory Function Overall Oral Motor/Sensory Function: Moderate impairment Facial ROM: Reduced right;Suspected CN VII (facial) dysfunction Facial Symmetry: Abnormal symmetry right Facial Strength: Reduced right Facial Sensation: Reduced right Lingual ROM: Reduced right Lingual Strength: Reduced Velum: Within Functional Limits Mandible: Within Functional Limits Motor Speech Overall Motor Speech: Impaired Respiration: Impaired Phonation: Low vocal intensity Resonance: Hypernasality Articulation: Impaired Level of Impairment: Word Intelligibility: Intelligibility reduced Word: 75-100% accurate Phrase: 50-74% accurate Sentence: 50-74% accurate Conversation: 50-74% accurate Motor Planning: Witnin functional limits Interfering Components: Hearing loss Effective Techniques: Slow rate;Increased vocal intensity;Over-articulate            Mitchell, CCC-SLP Acute Rehabilitation Services   07/23/2021, 1:21 PM

## 2021-07-23 NOTE — Progress Notes (Signed)
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  Work-up significant for cardioembolic acute CVA, he was admitted for further management.     Assessment & Plan:   Principal Problem:   Acute cardioembolic stroke [facility]) Active Problems:   Paroxysmal atrial fibrillation (HCC)   Essential hypertension   Metastatic squamous cell carcinoma to head and neck (HCC)   Long term current use of anticoagulant therapy   Glaucoma   Acute embolic stroke ([place])   Protein-calorie malnutrition, severe    Acute cardioembolic stroke ([place]) -MRI brain significant for multiple small foci of acute ischemia within both cerebral and cerebellar hemispheres, pattern suggestive of emboli from cardiac or proximal aortic source. -Patient on Eliquis, reports compliance, so this would be considered Eliquis failure, will be transition to Pradaxa as discussed with neurology.. -PT/OT is pending -Remains n.p.o. after SLP evaluation, plan for MBSS today, if fails likely will need core track. -Follow on 2D echo. -LDL is 74, started on low-dose statin - A1c is 5.5 last April    Paroxysmal atrial fibrillation (HCC) - Currently rate controlled without AV nodal blocking agents. -See above discussion regarding Eliquis versus Pradaxa     Essential hypertension Permissive hypertension PRN IV Hydralazine for SBP > 220 or DBP > 115   Metastatic  squamous cell carcinoma to head and neck (HCC) Diagnosed 2022 status post left superficial parotidectomy and a modified radical neck dissection completed on January 26, 2021 S/P radiation therapy with Dr. Isidore Moos Continue outpatient follow up with Dr. Alen Blew   Glaucoma Home regimen of eye drops resumed.   Long term current use of anticoagulant therapy On Chronic Eliquis for atrial fibrillation, CHADSVASC score of 3.  Holding for now per Neurology recommendations     DVT prophylaxis: Eliquis>> pradaxa Code Status: Full Family Communication: Son at bedside Disposition:   Status is: Inpatient Remains inpatient appropriate because: Further work-up for acute CVA, remains NPO.   Consultants:  Neurology   Subjective:  No significant events overnight, patient failed his speech evaluation, plan for MBSS later today  Objective: Vitals:   07/23/21 0726 07/23/21 0858 07/23/21 1100 07/23/21 1157  BP: (!) 143/95 (!) 158/87 112/73 (!) 111/58  Pulse: 79 100 77 80  Resp: 19 18 (!) 21 (!) 23  Temp: 98.5 F (36.9 C)   97.8 F (36.6 C)  TempSrc: Axillary   Oral  SpO2: 95%   95%  Weight:      Height:       No intake or output data in the 24 hours ending 07/23/21 1351 Filed Weights   07/23/21 0100  Weight: 62.5 kg    Examination:  Awake Alert, Oriented X 3, significant dysarthria, and facial droop. Symmetrical Chest wall movement, Good air movement bilaterally, CTAB RRR,No Gallops,Rubs or new Murmurs, No Parasternal Heave +ve B.Sounds, Abd Soft, No tenderness, No rebound - guarding or rigidity. No Cyanosis, Clubbing or edema, No new Rash or bruise       Data Reviewed: I have  personally reviewed following labs and imaging studies  CBC: Recent Labs  Lab 07/22/21 1632 07/22/21 1931 07/23/21 0406  WBC 6.4  --  6.3  NEUTROABS  --   --  3.7  HGB 13.9 13.9 13.6  HCT 42.3 41.0 38.7*  MCV 98.8  --  93.0  PLT 155  --  136*    Basic Metabolic Panel: Recent Labs  Lab  07/22/21 1632 07/22/21 1931 07/23/21 0406  NA 136 137 138  K 4.0 4.0 3.6  CL 105 106 110  CO2 21*  --  24  GLUCOSE 120* 104* 93  BUN '10 11 8  '$ CREATININE 0.82 0.80 0.86  CALCIUM 9.3  --  9.0  MG  --   --  1.9    GFR: Estimated Creatinine Clearance: 49.5 mL/min (by C-G formula based on SCr of 0.86 mg/dL).  Liver Function Tests: Recent Labs  Lab 07/22/21 1632 07/23/21 0406  AST 25 21  ALT 12 11  ALKPHOS 82 70  BILITOT 0.8 1.0  PROT 7.0 6.3*  ALBUMIN 3.4* 3.0*    CBG: Recent Labs  Lab 07/22/21 1629  GLUCAP 120*     Recent Results (from the past 240 hour(s))  Resp Panel by RT-PCR (Flu A&B, Covid) Nasopharyngeal Swab     Status: None   Collection Time: 07/22/21  9:08 PM   Specimen: Nasopharyngeal Swab; Nasopharyngeal(NP) swabs in vial transport medium  Result Value Ref Range Status   SARS Coronavirus 2 by RT PCR NEGATIVE NEGATIVE Final    Comment: (NOTE) SARS-CoV-2 target nucleic acids are NOT DETECTED.  The SARS-CoV-2 RNA is generally detectable in upper respiratory specimens during the acute phase of infection. The lowest concentration of SARS-CoV-2 viral copies this assay can detect is 138 copies/mL. A negative result does not preclude SARS-Cov-2 infection and should not be used as the sole basis for treatment or other patient management decisions. A negative result may occur with  improper specimen collection/handling, submission of specimen other than nasopharyngeal swab, presence of viral mutation(s) within the areas targeted by this assay, and inadequate number of viral copies(<138 copies/mL). A negative result must be combined with clinical observations, patient history, and epidemiological information. The expected result is Negative.  Fact Sheet for Patients:  EntrepreneurPulse.com.au  Fact Sheet for Healthcare Providers:  IncredibleEmployment.be  This test is no t yet approved or cleared by the [place]  FDA and  has been authorized for detection and/or diagnosis of SARS-CoV-2 by FDA under an Emergency Use Authorization (EUA). This EUA will remain  in effect (meaning this test can be used) for the duration of the COVID-19 declaration under Section 564(b)(1) of the Act, 21 U.S.C.section 360bbb-3(b)(1), unless the authorization is terminated  or revoked sooner.       Influenza A by PCR NEGATIVE NEGATIVE Final   Influenza B by PCR NEGATIVE NEGATIVE Final    Comment: (NOTE) The Xpert Xpress SARS-CoV-2/FLU/RSV plus assay is intended as an aid in the diagnosis of influenza from Nasopharyngeal swab specimens and should not be used as a sole basis for treatment. Nasal washings and aspirates are unacceptable for Xpert Xpress SARS-CoV-2/FLU/RSV testing.  Fact Sheet for Patients: EntrepreneurPulse.com.au  Fact Sheet for Healthcare Providers: IncredibleEmployment.be  This test is not yet approved or cleared by the [place] FDA and has been authorized for detection and/or diagnosis of SARS-CoV-2 by FDA under an Emergency Use Authorization (EUA). This EUA will remain in effect (meaning this test can be used) for the duration of the COVID-19  declaration under Section 564(b)(1) of the Act, 21 U.S.C. section 360bbb-3(b)(1), unless the authorization is terminated or revoked.  Performed at [facility], [place] [address], [place], [place] [address]          Radiology Studies: MR BRAIN WO CONTRAST  Result Date: 07/22/2021 CLINICAL DATA:  Facial droop and slurred speech. EXAM: MRI HEAD WITHOUT CONTRAST TECHNIQUE: Multiplanar, multiecho pulse sequences of the brain and surrounding structures were obtained without intravenous contrast. COMPARISON:  Head CT and CT perfusion 07/22/2021 FINDINGS: Brain: Multiple small foci of acute ischemia within both cerebral and cerebellar hemispheres. Chronic blood products at old left occipital infarct. A few  scattered foci of chronic microhemorrhage. There is multifocal hyperintense T2-weighted signal within the white matter. Generalized cerebral volume loss. Multiple old small vessel infarcts of the corona radiata. The midline structures are normal. Vascular: Major flow voids are preserved. Skull and upper cervical spine: Normal calvarium and skull base. Visualized upper cervical spine and soft tissues are normal. Sinuses/Orbits:Left mastoid effusion.  Normal orbits. IMPRESSION: 1. Multiple small foci of acute ischemia within both cerebral and cerebellar hemispheres. The pattern is suggestive of emboli from a cardiac or proximal aortic source. 2. Multiple old small vessel infarcts and findings of chronic ischemic microangiopathy. Electronically Signed   By: Ulyses Jarred M.D.   On: 07/22/2021 20:35   ECHOCARDIOGRAM COMPLETE BUBBLE STUDY  Result Date: 07/23/2021    ECHOCARDIOGRAM REPORT   Patient Name:   Donald Hawkins Date of Exam: 07/23/2021 Medical Rec #:  [id number]   Height:       70.0 in Accession #:    [id number]  Weight:       137.8 lb Date of Birth:  1928/08/07   BSA:          1.782 m Patient Age:    24 years    BP:           143/95 mmHg Patient Gender: M           HR:           75 bpm. Exam Location:  Inpatient Procedure: 2D Echo, Saline Contrast Bubble Study, Cardiac Doppler and Color            Doppler Indications:    Stroke  History:        Patient has no prior history of Echocardiogram examinations.                 Abnormal ECG, Stroke, Arrythmias:Atrial Fibrillation; Risk                 Factors:Hypertension. Cancer.  Sonographer:    Roseanna Rainbow RDCS Referring Phys: [id number] [place]  Sonographer Comments: Technically difficult study due to poor echo windows. VEry low parasternal window. IMPRESSIONS  1. Left ventricular ejection fraction, by estimation, is 60 to 65%. The left ventricle has normal function. The left ventricle has no regional wall motion abnormalities. There is mild left ventricular  hypertrophy. Left ventricular diastolic parameters are consistent with Grade I diastolic dysfunction (impaired relaxation).  2. Right ventricular systolic function is normal. The right ventricular size is normal. There is normal pulmonary artery systolic pressure.  3. Right atrial size was mildly dilated.  4. The mitral valve is normal in structure. No evidence of mitral valve regurgitation. No evidence of mitral stenosis.  5. Some nodular calcification of AV with shadowing in LVOT likely from calcium . The aortic valve is abnormal. There is moderate calcification of the aortic valve.  There is moderate thickening of the aortic valve. Aortic valve regurgitation is trivial. Mild aortic valve stenosis.  6. The inferior vena cava is normal in size with greater than 50% respiratory variability, suggesting right atrial pressure of 3 mmHg.  7. Agitated saline contrast bubble study was negative, with no evidence of any interatrial shunt. FINDINGS  Left Ventricle: Left ventricular ejection fraction, by estimation, is 60 to 65%. The left ventricle has normal function. The left ventricle has no regional wall motion abnormalities. The left ventricular internal cavity size was normal in size. There is  mild left ventricular hypertrophy. Left ventricular diastolic parameters are consistent with Grade I diastolic dysfunction (impaired relaxation). Right Ventricle: The right ventricular size is normal. No increase in right ventricular wall thickness. Right ventricular systolic function is normal. There is normal pulmonary artery systolic pressure. The tricuspid regurgitant velocity is 2.50 m/s, and  with an assumed right atrial pressure of 8 mmHg, the estimated right ventricular systolic pressure is 02.5 mmHg. Left Atrium: Left atrial size was normal in size. Right Atrium: Right atrial size was mildly dilated. Pericardium: There is no evidence of pericardial effusion. Mitral Valve: The mitral valve is normal in structure. No  evidence of mitral valve regurgitation. No evidence of mitral valve stenosis. MV peak gradient, 10.4 mmHg. The mean mitral valve gradient is 5.0 mmHg. Tricuspid Valve: The tricuspid valve is normal in structure. Tricuspid valve regurgitation is mild . No evidence of tricuspid stenosis. Aortic Valve: Some nodular calcification of AV with shadowing in LVOT likely from calcium. The aortic valve is abnormal. There is moderate calcification of the aortic valve. There is moderate thickening of the aortic valve. Aortic valve regurgitation is trivial. Mild aortic stenosis is present. Aortic valve mean gradient measures 9.0 mmHg. Aortic valve peak gradient measures 18.4 mmHg. Aortic valve area, by VTI measures 2.70 cm. Pulmonic Valve: The pulmonic valve was normal in structure. Pulmonic valve regurgitation is not visualized. No evidence of pulmonic stenosis. Aorta: The aortic root is normal in size and structure. Venous: The inferior vena cava is normal in size with greater than 50% respiratory variability, suggesting right atrial pressure of 3 mmHg. IAS/Shunts: No atrial level shunt detected by color flow Doppler. Agitated saline contrast was given intravenously to evaluate for intracardiac shunting. Agitated saline contrast bubble study was negative, with no evidence of any interatrial shunt.  LEFT VENTRICLE PLAX 2D LVIDd:         4.30 cm LVIDs:         2.80 cm LV PW:         1.30 cm LV IVS:        1.20 cm LVOT diam:     2.60 cm LV SV:         109 LV SV Index:   61 LVOT Area:     5.31 cm  LV Volumes (MOD) LV vol d, MOD A2C: 74.7 ml LV vol d, MOD A4C: 79.3 ml LV vol s, MOD A2C: 30.2 ml LV vol s, MOD A4C: 25.6 ml LV SV MOD A2C:     44.5 ml LV SV MOD A4C:     79.3 ml LV SV MOD BP:      50.9 ml RIGHT VENTRICLE            IVC RV S prime:     9.23 cm/s  IVC diam: 2.40 cm TAPSE (M-mode): 1.4 cm LEFT ATRIUM           Index  RIGHT ATRIUM           Index LA diam:      3.30 cm 1.85 cm/m   RA Area:     23.80 cm LA Vol (A2C):  32.9 ml 18.47 ml/m  RA Volume:   64.00 ml  35.92 ml/m LA Vol (A4C): 70.5 ml 39.57 ml/m  AORTIC VALVE AV Area (Vmax):    2.75 cm AV Area (Vmean):   3.10 cm AV Area (VTI):     2.70 cm AV Vmax:           214.50 cm/s AV Vmean:          137.750 cm/s AV VTI:            0.403 m AV Peak Grad:      18.4 mmHg AV Mean Grad:      9.0 mmHg LVOT Vmax:         111.00 cm/s LVOT Vmean:        80.500 cm/s LVOT VTI:          0.205 m LVOT/AV VTI ratio: 0.51  AORTA Ao Root diam: 3.20 cm MITRAL VALVE               TRICUSPID VALVE MV Area (PHT): 2.42 cm    TR Peak grad:   25.0 mmHg MV Area VTI:   5.21 cm    TR Vmax:        250.00 cm/s MV Peak grad:  10.4 mmHg MV Mean grad:  5.0 mmHg    SHUNTS MV Vmax:       1.61 m/s    Systemic VTI:  0.20 m MV Vmean:      94.8 cm/s   Systemic Diam: 2.60 cm MV Decel Time: 313 msec MV E velocity: 76.70 cm/s Jenkins Rouge MD Electronically signed by Jenkins Rouge MD Signature Date/Time: 07/23/2021/10:25:04 AM    Final    CT HEAD CODE STROKE WO CONTRAST  Result Date: 07/22/2021 CLINICAL DATA:  Code stroke.  Right-sided facial droop EXAM: CT HEAD WITHOUT CONTRAST TECHNIQUE: Contiguous axial images were obtained from the base of the skull through the vertex without intravenous contrast. RADIATION DOSE REDUCTION: This exam was performed according to the departmental dose-optimization program which includes automated exposure control, adjustment of the mA and/or kV according to patient size and/or use of iterative reconstruction technique. COMPARISON:  None Available. FINDINGS: Brain: There is no mass, hemorrhage or extra-axial collection. The size and configuration of the ventricles and extra-axial CSF spaces are normal. There is an age indeterminate left occipital infarct favored to be late subacute to chronic. No visible infarct within the left MCA territory. Vascular: No abnormal hyperdensity of the major intracranial arteries or dural venous sinuses. No intracranial atherosclerosis. Skull: The  visualized skull base, calvarium and extracranial soft tissues are normal. Sinuses/Orbits: No fluid levels or advanced mucosal thickening of the visualized paranasal sinuses. No mastoid or middle ear effusion. The orbits are normal. ASPECTS [facility] Stroke Program Early CT Score) - Ganglionic level infarction (caudate, lentiform nuclei, internal capsule, insula, M1-M3 cortex): 7 - Supraganglionic infarction (M4-M6 cortex): 3 Total score (0-10 with 10 being normal): 10 IMPRESSION: 1. No acute hemorrhage. 2. Age indeterminate left occipital infarct, favored to be late subacute to chronic. 3. ASPECTS is 10. These results were called by telephone at the time of interpretation on 07/22/2021 at 7:41 pm to provider [facility] , who verbally acknowledged these results. Electronically Signed   By: Ulyses Jarred M.D.   On:  07/22/2021 19:42   CT ANGIO HEAD NECK W WO CM W PERF (CODE STROKE)  Addendum Date: 07/22/2021   ADDENDUM REPORT: 07/22/2021 20:36 ADDENDUM: Correction to findings above: There is bilateral carotid bifurcation atherosclerosis without hemodynamically significant stenosis. Electronically Signed   By: Ulyses Jarred M.D.   On: 07/22/2021 20:36   Result Date: 07/22/2021 CLINICAL DATA:  Right facial droop EXAM: CT ANGIOGRAPHY HEAD AND NECK CT PERFUSION BRAIN TECHNIQUE: Multidetector CT imaging of the head and neck was performed using the standard protocol during bolus administration of intravenous contrast. Multiplanar CT image reconstructions and MIPs were obtained to evaluate the vascular anatomy. Carotid stenosis measurements (when applicable) are obtained utilizing NASCET criteria, using the distal internal carotid diameter as the denominator. Multiphase CT imaging of the brain was performed following IV bolus contrast injection. Subsequent parametric perfusion maps were calculated using RAPID software. RADIATION DOSE REDUCTION: This exam was performed according to the departmental dose-optimization  program which includes automated exposure control, adjustment of the mA and/or kV according to patient size and/or use of iterative reconstruction technique. CONTRAST:  18m OMNIPAQUE IOHEXOL 350 MG/ML SOLN COMPARISON:  Head CT 07/22/2021 FINDINGS: CTA NECK FINDINGS SKELETON: There is no bony spinal canal stenosis. No lytic or blastic lesion. OTHER NECK: Normal pharynx, larynx and major salivary glands. No cervical lymphadenopathy. Unremarkable thyroid gland. UPPER CHEST: No pneumothorax or pleural effusion. No nodules or masses. AORTIC ARCH: There is no calcific atherosclerosis of the aortic arch. There is no aneurysm, dissection or hemodynamically significant stenosis of the visualized portion of the aorta. Conventional 3 vessel aortic branching pattern. The visualized proximal subclavian arteries are widely patent. RIGHT CAROTID SYSTEM: Normal without aneurysm, dissection or stenosis. LEFT CAROTID SYSTEM: Normal without aneurysm, dissection or stenosis. VERTEBRAL ARTERIES: Right dominant configuration. Moderate stenosis of the right vertebral artery origin. Left origin is normal. Both vertebral arteries are otherwise normal to the skull base. CTA HEAD FINDINGS There is a left mastoid effusion. POSTERIOR CIRCULATION: --Vertebral arteries: Normal V4 segments. --Inferior cerebellar arteries: Normal. --Basilar artery: Normal. --Superior cerebellar arteries: Normal. --Posterior cerebral arteries (PCA): Normal. ANTERIOR CIRCULATION: --Intracranial internal carotid arteries: Normal. --Anterior cerebral arteries (ACA): Normal. Both A1 segments are present. Patent anterior communicating artery (a-comm). --Middle cerebral arteries (MCA): Normal. VENOUS SINUSES: As permitted by contrast timing, patent. ANATOMIC VARIANTS: Fetal origin of the right posterior cerebral artery. Review of the MIP images confirms the above findings. CT Brain Perfusion Findings: ASPECTS: 10 CBF (<30%) Volume: 0243mPerfusion (Tmax>6.0s) volume: 43m51mMismatch Volume: 43mL36mfarction Location:No core infarct. The calculated region of ischemia is located in the left frontal operculum. IMPRESSION: 1. No emergent large vessel occlusion or high-grade stenosis of the intracranial arteries. 2. No core infarct. 9 mL area of ischemic penumbra within the left frontal operculum. 3. Moderate stenosis of the right vertebral artery origin. Electronically Signed: By: KeviUlyses Jarred. On: 07/22/2021 20:04        Scheduled Meds:   stroke: early stages of recovery book   Does not apply Once   brimonidine  1 drop Both Eyes BID   dabigatran  150 mg Oral Q12H   dorzolamide-timolol  1 drop Both Eyes BID   latanoprost  1 drop Both Eyes QHS   rosuvastatin  10 mg Oral Daily   Continuous Infusions:  sodium chloride 75 mL/hr at 07/23/21 1052     LOS: 1 day       [facility]   To contact the attending provider between 7A-7P or  the covering provider during after hours 7P-7A, please log into the web site [email or web address] and access using universal [place] password for that web site. If you do not have the password, please call the hospital operator.  07/23/2021, 1:51 PM

## 2021-07-23 NOTE — Evaluation (Signed)
Occupational Therapy Evaluation Patient Details Name: Stephanie Mcglone MRN: [id number] DOB: Oct 10, 1928 Today's Date: 07/23/2021   History of Present Illness Pt is a 88 year old man admitted with R side weakness, dysarthria and R facial droop on 07/22/21. MRI+ multiple small foci infarcts in B cerebrum and cerebellum. 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   Clinical Impression   Pt was living alone, ambulating slowly with shuffling steps with use of standard walker within his home. He was sponge bathing and dressing himself and dependent on his family for meal prep, housekeeping and transportation. Family reports a significant decline in the last several months. Pt is very hard of hearing. He presents with decreased awareness of deficits, R UE drift, generalized weakness, impaired sitting and standing balance and reports L visual field changes. He is blind in his R eye from glaucoma. Pt requires up to moderate assistance with RW for OOB and ADLs. Cognition needs further screening. Recommending inpatient rehab upon discharge.      Recommendations for follow up therapy are one component of a multi-disciplinary discharge planning process, led by the attending physician.  Recommendations may be updated based on patient status, additional functional criteria and insurance authorization.   Follow Up Recommendations       Assistance Recommended at Discharge Frequent or constant Supervision/Assistance  Patient can return home with the following A lot of help with walking and/or transfers;A lot of help with bathing/dressing/bathroom;Direct supervision/assist for medications management;Assistance with cooking/housework;Direct supervision/assist for financial management;Assist for transportation;Help with stairs or ramp for entrance    Functional Status Assessment  Patient has had a recent decline in their functional status and demonstrates the  ability to make significant improvements in function in a reasonable and predictable amount of time.  Equipment Recommendations  Other (comment) (defer to next venue)    Recommendations for Other Services       Precautions / Restrictions Precautions Precautions: Fall      Mobility Bed Mobility Overal bed mobility: Needs Assistance Bed Mobility: Supine to Sit     Supine to sit: Min assist     General bed mobility comments: moderate verbal cues, min assist to raise trunk, increased time and effort    Transfers Overall transfer level: Needs assistance Equipment used: Rolling walker (2 wheels) Transfers: Sit to/from Stand Sit to Stand: Mod assist, +2 safety/equipment           General transfer comment: assist to rise and steady      Balance Overall balance assessment: Needs assistance   Sitting balance-Leahy Scale: Poor Sitting balance - Comments: LOB when donning L sock   Standing balance support: Single extremity supported Standing balance-Leahy Scale: Poor Standing balance comment: requires moderate assistance and at least one hand support in static standing at sink                           ADL either performed or assessed with clinical judgement   ADL Overall ADL's : Needs assistance/impaired Eating/Feeding: NPO   Grooming: Wash/dry hands;Standing;Moderate assistance Grooming Details (indicate cue type and reason): mod assist for standing Upper Body Bathing: Sitting;Moderate assistance   Lower Body Bathing: Moderate assistance;Sit to/from stand   Upper Body Dressing : Sitting;Minimal assistance   Lower Body Dressing: Moderate assistance;Sitting/lateral leans Lower Body Dressing Details (indicate cue type and reason): LOB with donning socks Toilet Transfer: Moderate assistance;+2 for safety/equipment;Ambulation;Rolling walker (2 wheels)  Toileting- Clothing Manipulation and Hygiene: Moderate assistance;Sit to/from stand       Functional  mobility during ADLs: Moderate assistance;+2 for safety/equipment;Rolling walker (2 wheels)       Vision Baseline Vision/History: 3 Glaucoma Ability to See in Adequate Light: 2 Moderately impaired Additional Comments: pt reports L field vision changes, but did not consistently demonstrate during testing     Perception     Praxis      Pertinent Vitals/Pain Pain Assessment Pain Assessment: Faces Faces Pain Scale: No hurt     Hand Dominance Right   Extremity/Trunk Assessment Upper Extremity Assessment RUE Deficits / Details: +drift with vision occluded RUE Sensation: decreased proprioception   Lower Extremity Assessment Lower Extremity Assessment: Defer to PT evaluation   Cervical / Trunk Assessment Cervical / Trunk Assessment: Kyphotic   Communication Communication Communication: HOH;Other (comment) (aids are at home)   Cognition Arousal/Alertness: Awake/alert Behavior During Therapy: WFL for tasks assessed/performed Overall Cognitive Status: Difficult to assess Area of Impairment: Problem solving, Safety/judgement, Following commands                       Following Commands: Follows one step commands with increased time (and multimodal cues) Safety/Judgement: Decreased awareness of safety, Decreased awareness of deficits   Problem Solving: Slow processing, Requires verbal cues General Comments: pt unaware when he began to drift R and flex knees in standing and need to sit with short distance ambulation     General Comments       Exercises     Shoulder Instructions      Home Living Family/patient expects to be discharged to:: Private residence Living Arrangements: Alone Available Help at Discharge: Family;Available PRN/intermittently;Personal care attendant Type of Home: House Home Access: Stairs to enter [employer] of Steps: 5 Entrance Stairs-Rails: Right;Left Home Layout: One level     Bathroom Shower/Tub: Tub/shower unit (does  not get in shower)   Biochemist, clinical: Standard     Home Equipment: Wheelchair - manual;Cane - single point;Standard Walker;BSC/3in1;Grab bars - tub/shower      Lives With: Alone    Prior Functioning/Environment Prior Level of Function : Needs assist             Mobility Comments: walks 50 feet with standard walker, increased time with slow shuffling steps per son ADLs Comments: sponge bathed, assisted for meals and housekeeping        OT Problem List: Decreased strength;Impaired balance (sitting and/or standing);Decreased activity tolerance;Decreased knowledge of use of DME or AE;Decreased safety awareness      OT Treatment/Interventions: Self-care/ADL training;DME and/or AE instruction;Therapeutic activities;Cognitive remediation/compensation;Patient/family education;Visual/perceptual remediation/compensation;Balance training    OT Goals(Current goals can be found in the care plan section) Acute Rehab OT Goals OT Goal Formulation: With patient Time For Goal Achievement: 08/06/21 Potential to Achieve Goals: Good  OT Frequency: Min 3X/week    Co-evaluation PT/OT/SLP Co-Evaluation/Treatment: Yes Reason for Co-Treatment: For patient/therapist safety   OT goals addressed during session: ADL's and self-care      AM-PAC OT "6 Clicks" Daily Activity     Outcome Measure Help from another person eating meals?: A Little Help from another person taking care of personal grooming?: A Lot Help from another person toileting, which includes using toliet, bedpan, or urinal?: A Lot Help from another person bathing (including washing, rinsing, drying)?: A Lot Help from another person to put on and taking off regular upper body clothing?: A Lot Help from another person to put on and taking off  regular lower body clothing?: A Lot 6 Click Score: 13   End of Session Equipment Utilized During Treatment: Rolling walker (2 wheels);Gait belt Nurse Communication: Mobility status  Activity  Tolerance: Patient tolerated treatment well Patient left: in chair;with call bell/phone within reach;with chair alarm set  OT Visit Diagnosis: Unsteadiness on feet (R26.81);Other abnormalities of gait and mobility (R26.89);Hemiplegia and hemiparesis;Muscle weakness (generalized) (M62.81);Other symptoms and signs involving cognitive function                Time: 0981-1914 OT Time Calculation (min): 37 min Charges:  OT General Charges $OT Visit: 1 Visit OT Evaluation $OT Eval Moderate Complexity: 1 Mod  Nestor Lewandowsky, OTR/L Acute Rehabilitation Services Pager: [phone] Office: [phone]   Malka So 07/23/2021, 1:58 PM

## 2021-07-23 NOTE — Progress Notes (Signed)
Modified Barium Swallow Progress Note  Patient Details  Name: Donald Hawkins MRN: [id number] Date of Birth: 01-11-1929  Today's Date: 07/23/2021  Modified Barium Swallow completed.  Full report located under Chart Review in the Imaging Section.  Brief recommendations include the following:  Clinical Impression  Pt presents with a mild oral dysphagia and moderate pharyngeal dysphagia likely of multifactorial etiology including prior head and neck cancer with XRT treatment, acute CVA, and pt deconditioning. Oral deficits included delayed bolus transit and mild oral residuals. Pharyngeal deficits c/b reduced timing and efficiency of laryngeal vestibule closure, reduced base of tongue retraction, decreased laryngeal elevation, reduced pharyngeal stripping wave, and unreliable laryngeal sensation. This allowed for pre swallow laryngeal penetration with thin liquids to the level of the vocal cords with post swallow aspiration (PAS-8) milder amounts. Pt was able to clear aspirates with cueing to cough and volitionally swallow. Directed for use of chin tuck however pt could not consistenly implement. With thicker viscosities, pt with improved pre swallow airway protection, however post swallow residual spillage consistently occured with tracheal aspiration and increased pharyngeal residuals (nectar thick, honey thick, puree). Given that aspiration occured in smaller amounts across boluses (excluding small trial of mechanical soft), recommend dysphagia 3 (mechanical soft) thin liquids with strict adherence to safe swallow precautions. ( no straws, 1:1 supervision, cough/clear throat with all swallows, small sips and bites, swallow x2).  SLP to closely follow for tolerance, family and pt education.   Swallow Evaluation Recommendations       SLP Diet Recommendations: Thin liquid;Dysphagia 3 (Mech soft) solids   Liquid Administration via: Cup;No straw   Medication Administration: Crushed with puree    Supervision: Full supervision/cueing for compensatory strategies;Staff to assist with self feeding   Compensations: Small sips/bites;Slow rate;Minimize environmental distractions;Clear throat intermittently;Hard cough after swallow;Effortful swallow;Multiple dry swallows after each bite/sip   Postural Changes: Seated upright at 90 degrees;Remain semi-upright after after feeds/meals (Comment)   Oral Care Recommendations: Oral care BID        Delayne Sanzo E Makenzy Krist MA, CCC-SLP 07/23/2021,2:41 PM

## 2021-07-23 NOTE — Progress Notes (Signed)
STROKE TEAM PROGRESS NOTE   SUBJECTIVE (INTERVAL HISTORY) His son and daughter-in-law is at the bedside.  Overall his condition is stable.  He still has prominent right facial droop, slurred speech and right lower extremity weakness.  Per son, patient compliant with Eliquis at home, only missed 1-2 doses for last 2 weeks.  He is not on any statin at home.   OBJECTIVE Temp:  [97.8 F (36.6 C)-98.7 F (37.1 C)] 97.9 F (36.6 C) (05/18 1558) Pulse Rate:  [50-101] 81 (05/18 1700) Cardiac Rhythm: Atrial fibrillation (05/18 0855) Resp:  [15-23] 19 (05/18 1700) BP: (111-162)/(58-99) 156/86 (05/18 1700) SpO2:  [92 %-99 %] 97 % (05/18 1558) Weight:  [62.5 kg] 62.5 kg (05/18 0100)  Recent Labs  Lab 07/22/21 1629  GLUCAP 120*   Recent Labs  Lab 07/22/21 1632 07/22/21 1931 07/23/21 0406  NA 136 137 138  K 4.0 4.0 3.6  CL 105 106 110  CO2 21*  --  24  GLUCOSE 120* 104* 93  BUN '10 11 8  '$ CREATININE 0.82 0.80 0.86  CALCIUM 9.3  --  9.0  MG  --   --  1.9   Recent Labs  Lab 07/22/21 1632 07/23/21 0406  AST 25 21  ALT 12 11  ALKPHOS 82 70  BILITOT 0.8 1.0  PROT 7.0 6.3*  ALBUMIN 3.4* 3.0*   Recent Labs  Lab 07/22/21 1632 07/22/21 1931 07/23/21 0406  WBC 6.4  --  6.3  NEUTROABS  --   --  3.7  HGB 13.9 13.9 13.6  HCT 42.3 41.0 38.7*  MCV 98.8  --  93.0  PLT 155  --  136*   No results for input(s): CKTOTAL, CKMB, CKMBINDEX, TROPONINI in the last 168 hours. Recent Labs    07/22/21 1632  LABPROT 16.2*  INR 1.3*   Recent Labs    07/22/21 2108  COLORURINE YELLOW  LABSPEC 1.006  PHURINE 7.0  GLUCOSEU NEGATIVE  HGBUR NEGATIVE  BILIRUBINUR NEGATIVE  KETONESUR NEGATIVE  PROTEINUR NEGATIVE  NITRITE NEGATIVE  LEUKOCYTESUR NEGATIVE       Component Value Date/Time   CHOL 134 07/23/2021 0406   TRIG 43 07/23/2021 0406   HDL 51 07/23/2021 0406   CHOLHDL 2.6 07/23/2021 0406   VLDL 9 07/23/2021 0406   LDLCALC 74 07/23/2021 0406   Lab Results  Component Value Date    HGBA1C 5.5 06/10/2021      Component Value Date/Time   LABOPIA NONE DETECTED 07/22/2021 2108   COCAINSCRNUR NONE DETECTED 07/22/2021 2108   LABBENZ NONE DETECTED 07/22/2021 2108   AMPHETMU NONE DETECTED 07/22/2021 2108   THCU NONE DETECTED 07/22/2021 2108   LABBARB NONE DETECTED 07/22/2021 2108    Recent Labs  Lab 07/23/21 0406  ETH <10    I have personally reviewed the radiological images below and agree with the radiology interpretations.  CT Soft Tissue Neck W Contrast  Result Date: 07/14/2021 CLINICAL DATA:  Head neck cancer, assess treatment response. EXAM: CT NECK WITH CONTRAST TECHNIQUE: Multidetector CT imaging of the neck was performed using the standard protocol following the bolus administration of intravenous contrast. RADIATION DOSE REDUCTION: This exam was performed according to the departmental dose-optimization program which includes automated exposure control, adjustment of the mA and/or kV according to patient size and/or use of iterative reconstruction technique. CONTRAST:  58m OMNIPAQUE IOHEXOL 300 MG/ML  SOLN COMPARISON:  01/20/2021 FINDINGS: Pharynx and larynx: No evidence of mass or inflammation. Salivary glands: Left parotidectomy with non masslike architectural distortion and  low-density in the operative region. No mass or inflammation of the remaining salivary glands. Thyroid: Unremarkable Lymph nodes: Left neck dissection with non masslike architectural distortion and low-density. No enlarged or heterogeneous nodes Vascular: Atheromatous plaque most notable at the carotid bifurcations. Limited intracranial: Negative Visualized orbits: Limited coverage is negative for acute finding. Bilateral cataract resection Mastoids and visualized paranasal sinuses: Patchy sinus opacification at the level of the ethmoids. Low-density polyp in the posterior left nasal cavity extending into the nasopharynx, 2.4 cm in length. Skeleton: No acute finding Upper chest: No acute finding  IMPRESSION: Post treatment left neck with no evidence of residual or recurrent disease. Electronically Signed   By: Jorje Guild M.D.   On: 07/14/2021 07:35   CT Chest W Contrast  Result Date: 07/14/2021 CLINICAL DATA:  Head and neck cancer, left parotid neoplasm, status post parotidectomy and XRT, assess treatment response * Tracking Code: BO * EXAM: CT CHEST WITH CONTRAST TECHNIQUE: Multidetector CT imaging of the chest was performed during intravenous contrast administration. RADIATION DOSE REDUCTION: This exam was performed according to the departmental dose-optimization program which includes automated exposure control, adjustment of the mA and/or kV according to patient size and/or use of iterative reconstruction technique. CONTRAST:  4m OMNIPAQUE IOHEXOL 300 MG/ML  SOLN COMPARISON:  PET-CT, 02/12/2021 FINDINGS: Cardiovascular: Aortic atherosclerosis. Aortic valve calcifications. Mild cardiomegaly. Left coronary artery calcifications. No pericardial effusion. Mediastinum/Nodes: Newly enlarged bilateral axillary lymph nodes, left greater than right, largest left axillary nodes measuring 2.0 x 1.2 cm (series 5, image 22). Slightly enlarged pretracheal and AP window lymph nodes, largest pretracheal node measuring 2.0 x 1.1 cm, previously 1.7 x 0.8 cm (series 5, image 31). Thyroid gland, trachea, and esophagus demonstrate no significant findings. Lungs/Pleura: Mild, diffuse bilateral bronchial wall thickening. No pleural effusion or pneumothorax. Upper Abdomen: No acute abnormality. Musculoskeletal: No chest wall abnormality. No suspicious osseous lesions identified. IMPRESSION: 1. Newly enlarged bilateral axillary lymph nodes, left greater than right. Slightly enlarged pretracheal and AP window lymph nodes. Findings are highly concerning for nodal metastatic disease. 2. Mild, diffuse bilateral bronchial wall thickening, consistent with nonspecific infectious or inflammatory bronchitis. 3. Coronary artery  disease. 4. Aortic valve calcifications. Correlate for echocardiographic evidence of aortic valve dysfunction. Aortic Atherosclerosis (ICD10-I70.0). Electronically Signed   By: ADelanna AhmadiM.D.   On: 07/14/2021 10:58   MR BRAIN WO CONTRAST  Result Date: 07/22/2021 CLINICAL DATA:  Facial droop and slurred speech. EXAM: MRI HEAD WITHOUT CONTRAST TECHNIQUE: Multiplanar, multiecho pulse sequences of the brain and surrounding structures were obtained without intravenous contrast. COMPARISON:  Head CT and CT perfusion 07/22/2021 FINDINGS: Brain: Multiple small foci of acute ischemia within both cerebral and cerebellar hemispheres. Chronic blood products at old left occipital infarct. A few scattered foci of chronic microhemorrhage. There is multifocal hyperintense T2-weighted signal within the white matter. Generalized cerebral volume loss. Multiple old small vessel infarcts of the corona radiata. The midline structures are normal. Vascular: Major flow voids are preserved. Skull and upper cervical spine: Normal calvarium and skull base. Visualized upper cervical spine and soft tissues are normal. Sinuses/Orbits:Left mastoid effusion.  Normal orbits. IMPRESSION: 1. Multiple small foci of acute ischemia within both cerebral and cerebellar hemispheres. The pattern is suggestive of emboli from a cardiac or proximal aortic source. 2. Multiple old small vessel infarcts and findings of chronic ischemic microangiopathy. Electronically Signed   By: KUlyses JarredM.D.   On: 07/22/2021 20:35   DG Swallowing Func-Speech Pathology  Result Date: 07/23/2021 Table formatting from  the original result was not included. Objective Swallowing Evaluation: Type of Study: MBS-Modified Barium Swallow Study  Patient Details Name: Donald Hawkins MRN: [id number] Date of Birth: 1928/10/06 Today's Date: 07/23/2021 Time: SLP Start Time (ACUTE ONLY): 1310 -SLP Stop Time (ACUTE ONLY): 1350 SLP Time Calculation (min) (ACUTE ONLY): 40 min Past Medical  History: Past Medical History: Diagnosis Date  Cancer ([place])   skin top of head - squamous and basal. "Squamous on left neck"  Depression   Hernia, inguinal   Hypertension  Past Surgical History: Past Surgical History: Procedure Laterality Date  ADJACENT TISSUE TRANSFER/TISSUE REARRANGEMENT  01/26/2021  Procedure: Tissue rearrangement Left neck 60 sq cm.;  Surgeon: Melida Quitter, MD;  Location: [facility];  Service: ENT;;  CATARACT EXTRACTION    EYE SURGERY    NASAL SEPTUM SURGERY    40 yrs ago  PAROTIDECTOMY Left 01/26/2021  Procedure: LEFT PAROTIDECTOMY;  Surgeon: Melida Quitter, MD;  Location: [facility];  Service: ENT;  Laterality: Left;  RADICAL NECK DISSECTION Left 01/26/2021  Procedure: MODIFIED RADICAL NECK DISSECTION;  Surgeon: Melida Quitter, MD;  Location: 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 Per MRI of head with multiple small foci of acute ischemia within both cerebral and  cerebellar hemispheres and Multiple old small vessel infarcts and findings of chronic ischemic microangiopathy.  Subjective: alert, upright in chair for procedure  Recommendations for follow up therapy are one component of a multi-disciplinary discharge planning process, led by the attending physician.  Recommendations may be updated based on patient status, additional functional criteria and insurance authorization. Assessment / Plan / Recommendation   07/23/2021   2:00 PM Clinical Impressions Clinical Impression Pt presents with a mild oral dysphagia and moderate pharyngeal dysphagia likely of  multifactorial etiology including prior head and neck cancer with XRT treatment, acute CVA, and pt deconditioning. Oral deficits included delayed bolus transit and mild oral residuals. Pharyngeal deficits c/b reduced timing and efficiency of laryngeal vestibule closure, reduced base of tongue retraction, decreased laryngeal elevation, reduced pharyngeal stripping wave, and unreliable laryngeal sensation. This allowed for pre swallow laryngeal penetration with thin liquids to the level of the vocal cords with post swallow aspiration (PAS-8) milder amounts. Pt was able to clear aspirates with cueing to cough and volitionally swallow. Directed for use of chin tuck however pt could not consistenly implement. With thicker viscosities, pt with improved pre swallow airway protection, however post swallow residual spillage consistently occured with tracheal aspiration and increased pharyngeal residuals (nectar thick, honey thick, puree). Given that aspiration occured in smaller amounts across boluses (excluding small trial of mechanical soft), recommend dysphagia 3 (mechanical soft) thin liquids with strict adherence to safe swallow precautions. ( no straws, 1:1 supervision, cough/clear throat with all swallows, small sips and bites, swallow x2).  SLP to closely follow for tolerance, family and pt education. SLP Visit Diagnosis Dysphagia, oropharyngeal phase (R13.12) Impact on safety and function Moderate aspiration risk     07/23/2021   2:00 PM Treatment Recommendations Treatment Recommendations Therapy as outlined in treatment plan below     07/23/2021   2:00 PM Prognosis Prognosis for Safe Diet Advancement Good Barriers  to Reach Goals Cognitive deficits;Time post onset   07/23/2021   2:00 PM Diet Recommendations SLP Diet Recommendations Thin liquid;Dysphagia 3 (Mech soft) solids Liquid Administration via Cup;No straw Medication Administration Crushed with puree Compensations Small sips/bites;Slow rate;Minimize environmental  distractions;Clear throat intermittently;Hard cough after swallow;Effortful swallow;Multiple dry swallows after each bite/sip Postural Changes Seated upright at 90 degrees;Remain semi-upright after after feeds/meals (Comment)     07/23/2021   2:00 PM Other Recommendations Oral Care Recommendations Oral care BID Follow Up Recommendations Acute inpatient rehab (3hours/day) Assistance recommended at discharge Frequent or constant Supervision/Assistance Functional Status Assessment Patient has had a recent decline in their functional status and demonstrates the ability to make significant improvements in function in a reasonable and predictable amount of time.   07/23/2021   2:00 PM Frequency and Duration  Speech Therapy Frequency (ACUTE ONLY) min 2x/week Treatment Duration 2 weeks     07/23/2021   2:00 PM Oral Phase Oral Phase Impaired Oral - Honey Cup Weak lingual manipulation;Delayed oral transit Oral - Nectar Cup Delayed oral transit;Lingual/palatal residue Oral - Thin Cup Lingual/palatal residue;Delayed oral transit Oral - Thin Straw Delayed oral transit Oral - Puree Delayed oral transit;Lingual/palatal residue Oral - Mech Soft Lingual/palatal residue;Decreased bolus cohesion    07/23/2021   2:00 PM Pharyngeal Phase Pharyngeal Phase Impaired Pharyngeal- Honey Cup Reduced laryngeal elevation;Reduced tongue base retraction;Penetration/Apiration after swallow;Pharyngeal residue - valleculae;Pharyngeal residue - pyriform;Reduced epiglottic inversion;Reduced pharyngeal peristalsis Pharyngeal Material does not enter airway;Material enters airway, passes BELOW cords without attempt by patient to eject out (silent aspiration) Pharyngeal- Nectar Cup Reduced epiglottic inversion;Reduced tongue base retraction;Pharyngeal residue - pyriform;Pharyngeal residue - valleculae;Reduced pharyngeal peristalsis;Reduced laryngeal elevation;Penetration/Apiration after swallow Pharyngeal Material enters airway, CONTACTS cords and not ejected  out;Material enters airway, passes BELOW cords without attempt by patient to eject out (silent aspiration) Pharyngeal- Thin Cup Penetration/Aspiration before swallow;Penetration/Apiration after swallow;Penetration/Aspiration during swallow;Reduced epiglottic inversion;Reduced tongue base retraction;Reduced pharyngeal peristalsis;Reduced airway/laryngeal closure;Pharyngeal residue - valleculae;Pharyngeal residue - pyriform Pharyngeal Material enters airway, CONTACTS cords and not ejected out;Material enters airway, passes BELOW cords without attempt by patient to eject out (silent aspiration) Pharyngeal- Thin Straw Penetration/Aspiration before swallow;Penetration/Aspiration during swallow;Pharyngeal residue - pyriform;Pharyngeal residue - valleculae;Reduced epiglottic inversion;Reduced tongue base retraction;Reduced laryngeal elevation Pharyngeal Material enters airway, CONTACTS cords and not ejected out Pharyngeal- Puree Delayed swallow initiation-vallecula;Penetration/Apiration after swallow;Pharyngeal residue - valleculae;Pharyngeal residue - pyriform;Reduced epiglottic inversion;Reduced tongue base retraction;Reduced laryngeal elevation Pharyngeal Material enters airway, remains ABOVE vocal cords and not ejected out Pharyngeal- Mechanical Soft Pharyngeal residue - pyriform;Pharyngeal residue - valleculae;Reduced pharyngeal peristalsis;Reduced laryngeal elevation    07/23/2021   2:00 PM Cervical Esophageal Phase  Cervical Esophageal Phase [facility] [place] E Hartness MA, CCC-SLP 07/23/2021, 2:41 PM                     ECHOCARDIOGRAM COMPLETE BUBBLE STUDY  Result Date: 07/23/2021    ECHOCARDIOGRAM REPORT   Patient Name:   Donald Hawkins Date of Exam: 07/23/2021 Medical Rec #:  [id number]   Height:       70.0 in Accession #:    [id number]  Weight:       137.8 lb Date of Birth:  26-Apr-1928   BSA:          1.782 m Patient Age:    13 years    BP:           143/95 mmHg Patient Gender: M           HR:  75 bpm. Exam  Location:  Inpatient Procedure: 2D Echo, Saline Contrast Bubble Study, Cardiac Doppler and Color            Doppler Indications:    Stroke  History:        Patient has no prior history of Echocardiogram examinations.                 Abnormal ECG, Stroke, Arrythmias:Atrial Fibrillation; Risk                 Factors:Hypertension. Cancer.  Sonographer:    Roseanna Rainbow RDCS Referring Phys: [id number] [place]  Sonographer Comments: Technically difficult study due to poor echo windows. VEry low parasternal window. IMPRESSIONS  1. Left ventricular ejection fraction, by estimation, is 60 to 65%. The left ventricle has normal function. The left ventricle has no regional wall motion abnormalities. There is mild left ventricular hypertrophy. Left ventricular diastolic parameters are consistent with Grade I diastolic dysfunction (impaired relaxation).  2. Right ventricular systolic function is normal. The right ventricular size is normal. There is normal pulmonary artery systolic pressure.  3. Right atrial size was mildly dilated.  4. The mitral valve is normal in structure. No evidence of mitral valve regurgitation. No evidence of mitral stenosis.  5. Some nodular calcification of AV with shadowing in LVOT likely from calcium . The aortic valve is abnormal. There is moderate calcification of the aortic valve. There is moderate thickening of the aortic valve. Aortic valve regurgitation is trivial. Mild aortic valve stenosis.  6. The inferior vena cava is normal in size with greater than 50% respiratory variability, suggesting right atrial pressure of 3 mmHg.  7. Agitated saline contrast bubble study was negative, with no evidence of any interatrial shunt. FINDINGS  Left Ventricle: Left ventricular ejection fraction, by estimation, is 60 to 65%. The left ventricle has normal function. The left ventricle has no regional wall motion abnormalities. The left ventricular internal cavity size was normal in size. There is  mild  left ventricular hypertrophy. Left ventricular diastolic parameters are consistent with Grade I diastolic dysfunction (impaired relaxation). Right Ventricle: The right ventricular size is normal. No increase in right ventricular wall thickness. Right ventricular systolic function is normal. There is normal pulmonary artery systolic pressure. The tricuspid regurgitant velocity is 2.50 m/s, and  with an assumed right atrial pressure of 8 mmHg, the estimated right ventricular systolic pressure is 41.6 mmHg. Left Atrium: Left atrial size was normal in size. Right Atrium: Right atrial size was mildly dilated. Pericardium: There is no evidence of pericardial effusion. Mitral Valve: The mitral valve is normal in structure. No evidence of mitral valve regurgitation. No evidence of mitral valve stenosis. MV peak gradient, 10.4 mmHg. The mean mitral valve gradient is 5.0 mmHg. Tricuspid Valve: The tricuspid valve is normal in structure. Tricuspid valve regurgitation is mild . No evidence of tricuspid stenosis. Aortic Valve: Some nodular calcification of AV with shadowing in LVOT likely from calcium. The aortic valve is abnormal. There is moderate calcification of the aortic valve. There is moderate thickening of the aortic valve. Aortic valve regurgitation is trivial. Mild aortic stenosis is present. Aortic valve mean gradient measures 9.0 mmHg. Aortic valve peak gradient measures 18.4 mmHg. Aortic valve area, by VTI measures 2.70 cm. Pulmonic Valve: The pulmonic valve was normal in structure. Pulmonic valve regurgitation is not visualized. No evidence of pulmonic stenosis. Aorta: The aortic root is normal in size and structure. Venous: The inferior vena cava is normal in  size with greater than 50% respiratory variability, suggesting right atrial pressure of 3 mmHg. IAS/Shunts: No atrial level shunt detected by color flow Doppler. Agitated saline contrast was given intravenously to evaluate for intracardiac shunting.  Agitated saline contrast bubble study was negative, with no evidence of any interatrial shunt.  LEFT VENTRICLE PLAX 2D LVIDd:         4.30 cm LVIDs:         2.80 cm LV PW:         1.30 cm LV IVS:        1.20 cm LVOT diam:     2.60 cm LV SV:         109 LV SV Index:   61 LVOT Area:     5.31 cm  LV Volumes (MOD) LV vol d, MOD A2C: 74.7 ml LV vol d, MOD A4C: 79.3 ml LV vol s, MOD A2C: 30.2 ml LV vol s, MOD A4C: 25.6 ml LV SV MOD A2C:     44.5 ml LV SV MOD A4C:     79.3 ml LV SV MOD BP:      50.9 ml RIGHT VENTRICLE            IVC RV S prime:     9.23 cm/s  IVC diam: 2.40 cm TAPSE (M-mode): 1.4 cm LEFT ATRIUM           Index        RIGHT ATRIUM           Index LA diam:      3.30 cm 1.85 cm/m   RA Area:     23.80 cm LA Vol (A2C): 32.9 ml 18.47 ml/m  RA Volume:   64.00 ml  35.92 ml/m LA Vol (A4C): 70.5 ml 39.57 ml/m  AORTIC VALVE AV Area (Vmax):    2.75 cm AV Area (Vmean):   3.10 cm AV Area (VTI):     2.70 cm AV Vmax:           214.50 cm/s AV Vmean:          137.750 cm/s AV VTI:            0.403 m AV Peak Grad:      18.4 mmHg AV Mean Grad:      9.0 mmHg LVOT Vmax:         111.00 cm/s LVOT Vmean:        80.500 cm/s LVOT VTI:          0.205 m LVOT/AV VTI ratio: 0.51  AORTA Ao Root diam: 3.20 cm MITRAL VALVE               TRICUSPID VALVE MV Area (PHT): 2.42 cm    TR Peak grad:   25.0 mmHg MV Area VTI:   5.21 cm    TR Vmax:        250.00 cm/s MV Peak grad:  10.4 mmHg MV Mean grad:  5.0 mmHg    SHUNTS MV Vmax:       1.61 m/s    Systemic VTI:  0.20 m MV Vmean:      94.8 cm/s   Systemic Diam: 2.60 cm MV Decel Time: 313 msec MV E velocity: 76.70 cm/s Jenkins Rouge MD Electronically signed by Jenkins Rouge MD Signature Date/Time: 07/23/2021/10:25:04 AM    Final    CT HEAD CODE STROKE WO CONTRAST  Result Date: 07/22/2021 CLINICAL DATA:  Code stroke.  Right-sided facial droop EXAM: CT HEAD WITHOUT CONTRAST TECHNIQUE:  Contiguous axial images were obtained from the base of the skull through the vertex without intravenous  contrast. RADIATION DOSE REDUCTION: This exam was performed according to the departmental dose-optimization program which includes automated exposure control, adjustment of the mA and/or kV according to patient size and/or use of iterative reconstruction technique. COMPARISON:  None Available. FINDINGS: Brain: There is no mass, hemorrhage or extra-axial collection. The size and configuration of the ventricles and extra-axial CSF spaces are normal. There is an age indeterminate left occipital infarct favored to be late subacute to chronic. No visible infarct within the left MCA territory. Vascular: No abnormal hyperdensity of the major intracranial arteries or dural venous sinuses. No intracranial atherosclerosis. Skull: The visualized skull base, calvarium and extracranial soft tissues are normal. Sinuses/Orbits: No fluid levels or advanced mucosal thickening of the visualized paranasal sinuses. No mastoid or middle ear effusion. The orbits are normal. ASPECTS [facility] Stroke Program Early CT Score) - Ganglionic level infarction (caudate, lentiform nuclei, internal capsule, insula, M1-M3 cortex): 7 - Supraganglionic infarction (M4-M6 cortex): 3 Total score (0-10 with 10 being normal): 10 IMPRESSION: 1. No acute hemorrhage. 2. Age indeterminate left occipital infarct, favored to be late subacute to chronic. 3. ASPECTS is 10. These results were called by telephone at the time of interpretation on 07/22/2021 at 7:41 pm to provider [facility] , who verbally acknowledged these results. Electronically Signed   By: Ulyses Jarred M.D.   On: 07/22/2021 19:42   CT ANGIO HEAD NECK W WO CM W PERF (CODE STROKE)  Addendum Date: 07/22/2021   ADDENDUM REPORT: 07/22/2021 20:36 ADDENDUM: Correction to findings above: There is bilateral carotid bifurcation atherosclerosis without hemodynamically significant stenosis. Electronically Signed   By: Ulyses Jarred M.D.   On: 07/22/2021 20:36   Result Date: 07/22/2021 CLINICAL DATA:   Right facial droop EXAM: CT ANGIOGRAPHY HEAD AND NECK CT PERFUSION BRAIN TECHNIQUE: Multidetector CT imaging of the head and neck was performed using the standard protocol during bolus administration of intravenous contrast. Multiplanar CT image reconstructions and MIPs were obtained to evaluate the vascular anatomy. Carotid stenosis measurements (when applicable) are obtained utilizing NASCET criteria, using the distal internal carotid diameter as the denominator. Multiphase CT imaging of the brain was performed following IV bolus contrast injection. Subsequent parametric perfusion maps were calculated using RAPID software. RADIATION DOSE REDUCTION: This exam was performed according to the departmental dose-optimization program which includes automated exposure control, adjustment of the mA and/or kV according to patient size and/or use of iterative reconstruction technique. CONTRAST:  158m OMNIPAQUE IOHEXOL 350 MG/ML SOLN COMPARISON:  Head CT 07/22/2021 FINDINGS: CTA NECK FINDINGS SKELETON: There is no bony spinal canal stenosis. No lytic or blastic lesion. OTHER NECK: Normal pharynx, larynx and major salivary glands. No cervical lymphadenopathy. Unremarkable thyroid gland. UPPER CHEST: No pneumothorax or pleural effusion. No nodules or masses. AORTIC ARCH: There is no calcific atherosclerosis of the aortic arch. There is no aneurysm, dissection or hemodynamically significant stenosis of the visualized portion of the aorta. Conventional 3 vessel aortic branching pattern. The visualized proximal subclavian arteries are widely patent. RIGHT CAROTID SYSTEM: Normal without aneurysm, dissection or stenosis. LEFT CAROTID SYSTEM: Normal without aneurysm, dissection or stenosis. VERTEBRAL ARTERIES: Right dominant configuration. Moderate stenosis of the right vertebral artery origin. Left origin is normal. Both vertebral arteries are otherwise normal to the skull base. CTA HEAD FINDINGS There is a left mastoid effusion.  POSTERIOR CIRCULATION: --Vertebral arteries: Normal V4 segments. --Inferior cerebellar arteries: Normal. --Basilar artery: Normal. --Superior cerebellar arteries:  Normal. --Posterior cerebral arteries (PCA): Normal. ANTERIOR CIRCULATION: --Intracranial internal carotid arteries: Normal. --Anterior cerebral arteries (ACA): Normal. Both A1 segments are present. Patent anterior communicating artery (a-comm). --Middle cerebral arteries (MCA): Normal. VENOUS SINUSES: As permitted by contrast timing, patent. ANATOMIC VARIANTS: Fetal origin of the right posterior cerebral artery. Review of the MIP images confirms the above findings. CT Brain Perfusion Findings: ASPECTS: 10 CBF (<30%) Volume: 38m Perfusion (Tmax>6.0s) volume: 934mMismatch Volume: 61m361mnfarction Location:No core infarct. The calculated region of ischemia is located in the left frontal operculum. IMPRESSION: 1. No emergent large vessel occlusion or high-grade stenosis of the intracranial arteries. 2. No core infarct. 9 mL area of ischemic penumbra within the left frontal operculum. 3. Moderate stenosis of the right vertebral artery origin. Electronically Signed: By: KevUlyses JarredD. On: 07/22/2021 20:04     PHYSICAL EXAM  Temp:  [97.8 F (36.6 C)-98.7 F (37.1 C)] 97.9 F (36.6 C) (05/18 1558) Pulse Rate:  [50-101] 81 (05/18 1700) Resp:  [15-23] 19 (05/18 1700) BP: (111-162)/(58-99) 156/86 (05/18 1700) SpO2:  [92 %-99 %] 97 % (05/18 1558) Weight:  [62.5 kg] 62.5 kg (05/18 0100)  General - Well nourished, well developed, in no apparent distress, mild lethargic.  Ophthalmologic - fundi not visualized due to noncooperation.  Cardiovascular - irregularly irregular heart rate and rhythm.  Neuro - awake, alert, eyes open, orientated to age, place, time and people. No aphasia, paucity of speech, moderate to severe dysarthria, following all simple commands. Able to name and repeat in dysarthric voice. No gaze palsy, tracking bilaterally, visual  field full, PERRL.  Right facial droop. Tongue protrusion mildly to the right. Bilateral UEs 4/5, no significant drift. LLE 4/5, RLE 3/5 proximal and distally foot drop. Sensation symmetrical bilaterally subjectively, b/l FTN intact grossly, gait not tested.    ASSESSMENT/PLAN Mr. Donald Hawkins a [age]. 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 No tPA given due to on Eliquis.    Stroke:  bilateral embolic shower embolic secondary to A-fib even on Eliquis versus hypercoagulable state with advanced malignancy CT no acute abnormality CTA head and neck left VA origin stenosis, bilateral ICA bulb atherosclerosis MRI embolic shower involving bilateral cerebral and cerebellum CTP 0/9 left parietal 2D Echo EF 60 to 65% LDL 74 HgbA1c 5.5 UDS negative Pradaxa for VTE prophylaxis Eliquis (apixaban) daily prior to admission, now on Pradaxa (dabigatran) twice a day.  Patient counseled to be compliant with his antithrombotic medications Ongoing aggressive stroke risk factor management Therapy recommendations: CIR Disposition: Pending  Advanced malignancy Left ear squamous cell carcinoma status post surgery in 09/2020 Found to have cervical lymph nodes and parotid gland metastasis in 12/2020 Status post parotidectomy Status post chemo and radiation Recent CT chest showed likely bilateral axillary lymph nodes metastasis. Patient follows oncology and ENT  Persistent A-fib On Eliquis PTA Given failed Eliquis, will recommend Pradaxa Need a pre-authorization from insurance for Pradaxa  Hypertension Stable Long term BP goal normotensive  Hyperlipidemia Home meds: None LDL 74, goal < 70 Now on Crestor 10 Continue statin at discharge  Other Stroke Risk Factors Advanced age  Other Active Problems None  Hospital day  # 1  Neurology will sign off. Please call with questions. Pt will [facility] about 4 weeks. Thanks for the consult.   Omari Koslosky  Erlinda Hong, MD PhD Stroke Neurology 07/23/2021 5:45 PM    To contact Stroke Continuity provider, please refer to [email or web address] After hours, contact General Neurology

## 2021-07-23 NOTE — Progress Notes (Signed)
Initial Nutrition Assessment  DOCUMENTATION CODES:   Severe malnutrition in context of chronic illness  INTERVENTION:   Ensure Enlive po TID, each supplement provides 350 kcal and 20 grams of protein.  Magic cup TID with meals, each supplement provides 290 kcal and 9 grams of protein.  MVI with minerals daily.  Chopped meats with extra gravy per patient request.   NUTRITION DIAGNOSIS:   Severe Malnutrition related to chronic illness (squamous cell cancer) as evidenced by severe muscle depletion, severe fat depletion, percent weight loss (10% weight loss within 1 month).  GOAL:   Patient will meet greater than or equal to 90% of their needs  MONITOR:   PO intake, Supplement acceptance  REASON FOR ASSESSMENT:   Malnutrition Screening Tool    ASSESSMENT:   88 yo male admitted with acute cardioembolic stroke. PMH includes metastatic squamous cell carcinoma to head and neck, S/P XRT, parotidectomy and neck dissection 01/2021, HTN, depression.  Spoke with patient and his family at bedside. Son reports that patient has been eating poorly for the past few weeks/month. He was drinking 2 Equate Max Protein supplements per day, but recently has only been drinking one. He has a poor appetite, but has been trying to increase his intake at home. He prepares his own breakfast (e.g., small piece of pie), doesn't eat anything for lunch, and son prepares his dinner. He has ice cream after dinner most days.  He eats soft foods, such as hot dogs, fish stick filets, ground beef, meatloaf, and ice cream. He does not eat bread because it is too dry or many meats d/t being tough.  S/P MBS today. Diet advanced to dysphagia 3 with thin liquids.  Labs reviewed.  CBG: 120 yesterday Medications reviewed.  Weight history reviewed.  Patient has experienced a 10% weight loss within the past month.  Patient meets criteria for severe malnutrition, given severe depletion of muscle and subcutaneous fat  mass and 10% weight loss within a month.  NUTRITION - FOCUSED PHYSICAL EXAM:  Flowsheet Row Most Recent Value  Orbital Region Severe depletion  Upper Arm Region Severe depletion  Thoracic and Lumbar Region Severe depletion  Buccal Region Severe depletion  Temple Region Severe depletion  Clavicle Bone Region Severe depletion  Clavicle and Acromion Bone Region Severe depletion  Scapular Bone Region Severe depletion  Dorsal Hand Severe depletion  Patellar Region Severe depletion  Anterior Thigh Region Severe depletion  Posterior Calf Region Severe depletion  Edema (RD Assessment) None  Hair Reviewed  Eyes Reviewed  Mouth Reviewed  Skin Reviewed  Nails Reviewed       Diet Order:   Diet Order             DIET DYS 3 Room service appropriate? Yes with Assist; Fluid consistency: Thin  Diet effective now                   EDUCATION NEEDS:   Not appropriate for education at this time  Skin:  Skin Assessment: Reviewed RN Assessment  Last BM:  5/17  Height:   Ht Readings from Last 1 Encounters:  07/23/21 '5\' 10"'$  (1.778 m)    Weight:   Wt Readings from Last 1 Encounters:  07/23/21 62.5 kg    Ideal Body Weight:  75.5 kg  BMI:  Body mass index is 19.77 kg/m.  Estimated Nutritional Needs:   Kcal:  2100-2300  Protein:  100-115 gm  Fluid:  >/= 2 L   Lucas Mallow RD, LDN, CNSC  Please refer to Amion for contact information.

## 2021-07-23 NOTE — TOC Benefit Eligibility Note (Signed)
Patient Teacher, English as a foreign language completed.    The patient is currently admitted and upon discharge could be taking dabigatran (Pradaxa) 150 mg.  Requires Prior Authorization  The patient is insured through [place], [place] Patient Advocate Specialist [place] Patient Advocate Team Direct Number: [phone]  Fax: [phone]

## 2021-07-23 NOTE — TOC Benefit Eligibility Note (Signed)
Patient Advocate Encounter   Received notification that prior authorization for Dabigatran Etexilate Mesylate '150MG'$  capsules is required.   PA submitted on 07/23/2021 Key [id number] Status is pending       Lyndel Safe, [place] Patient Advocate Specialist [place] Patient Advocate Team Direct Number: [phone]  Fax: [phone]

## 2021-07-23 NOTE — Progress Notes (Signed)
ANTICOAGULATION CONSULT NOTE - Initial Consult  Pharmacy Consult for Dabigatran Indication: atrial fibrillation, patient failed apixaban  No Known Allergies  Patient Measurements: Height: '5\' 10"'$  (177.8 cm) Weight: 62.5 kg (137 lb 12.6 oz) IBW/kg (Calculated) : 73   Vital Signs: Temp: 97.8 F (36.6 C) (05/18 1157) Temp Source: Oral (05/18 1157) BP: 111/58 (05/18 1157) Pulse Rate: 80 (05/18 1157)  Labs: Recent Labs    07/22/21 1632 07/22/21 1931 07/23/21 0406  HGB 13.9 13.9 13.6  HCT 42.3 41.0 38.7*  PLT 155  --  136*  APTT  --   --  35  LABPROT 16.2*  --   --   INR 1.3*  --   --   CREATININE 0.82 0.80 0.86    Estimated Creatinine Clearance: 49.5 mL/min (by C-G formula based on SCr of 0.86 mg/dL).   Medical History: Past Medical History:  Diagnosis Date   Cancer ([place])    skin top of head - squamous and basal. "Squamous on left neck"   Depression    Hernia, inguinal    Hypertension     Assessment: 88 yo male with acute admission for CVA. Patient with h/o afib on apixaban PTA. Patient being considered an apixaban failure. Neurology has consulted pharmacy to start patient on pradaxa. Patient's Crcl ~50.   Pradaxa is nonformulary on patient's insurance. Pharmacy tech working on [place] for medication.   Goal of Therapy:  Prevention of stroke Monitor platelets by anticoagulation protocol: Yes   Plan:  Pradaxa '150mg'$  PO BID Monitor for bleeding and renal function  Alyson Ki A. Levada Dy, PharmD, BCPS, FNKF Clinical Pharmacist [place] Please utilize Amion for appropriate phone number to reach the unit pharmacist ([place])  07/23/2021,12:51 PM

## 2021-07-23 NOTE — Progress Notes (Signed)
  Echocardiogram 2D Echocardiogram has been performed.  Donald Hawkins 07/23/2021, 10:59 AM

## 2021-07-23 NOTE — Evaluation (Signed)
Clinical/Bedside Swallow Evaluation Patient Details  Name: Donald Hawkins MRN: [id number] Date of Birth: 07-10-1928  Today's Date: 07/23/2021 Time: SLP Start Time (ACUTE ONLY): 1120 SLP Stop Time (ACUTE ONLY): 1140 SLP Time Calculation (min) (ACUTE ONLY): 20 min  Past Medical History:  Past Medical History:  Diagnosis Date   Cancer ([place])    skin top of head - squamous and basal. "Squamous on left neck"   Depression    Hernia, inguinal    Hypertension    Past Surgical History:  Past Surgical History:  Procedure Laterality Date   ADJACENT TISSUE TRANSFER/TISSUE REARRANGEMENT  01/26/2021   Procedure: Tissue rearrangement Left neck 60 sq cm.;  Surgeon: Melida Quitter, MD;  Location: [place];  Service: ENT;;   CATARACT EXTRACTION     EYE SURGERY     NASAL SEPTUM SURGERY     40 yrs ago   PAROTIDECTOMY Left 01/26/2021   Procedure: LEFT PAROTIDECTOMY;  Surgeon: Melida Quitter, MD;  Location: [place];  Service: ENT;  Laterality: Left;   RADICAL NECK DISSECTION Left 01/26/2021   Procedure: MODIFIED RADICAL NECK DISSECTION;  Surgeon: Melida Quitter, MD;  Location: [facility] OR;  Service: ENT;  Laterality: Left;   HPI:  88 year old male with past medical history of left external ear squamous cell carcinoma of the left helix with subsequent development of metastatic disease to the left lateral neck diagnosed 12/2020 status post surgical resection (parotidectomy) and radiation therapy with chronic right facial droop, paroxysmal atrial fibrillation on Eliquis, hypertension who presents to [facility] emergency department with complaint of slurred speech. Per MRI of head with multiple small foci of acute ischemia within both cerebral and  cerebellar hemispheres and Multiple old small vessel infarcts and findings of chronic ischemic microangiopathy.    Assessment / Plan / Recommendation  Clinical Impression  Pt presents with concern for component of pharyngeal dysphagia during PO trials. Oral deficits also  appreciated included right facial droop, weakness post acute CVA.  Pt and family deny hx of dysphagia at baseline. Of note, pt completed XRT in January for left parotid cancer. Pt with residual deficits from XRT tx and left neck dissection including xerostomia as expected. He also notes dysgeusia. Pt and family report that pt has had dentition fall out (3 total starting during tx and after hix XRT tx; they had planned to see dentist but appointments got pushed back); encouraged need for dentist when pt able as risk for osteoradionecrosis increased post XRT. Pt with overt coughing in 2/3 trials of thin liquids (both cup and straw sip) concerning for reduced airway protection. Trials of puree were without overt s/sx of aspiration. Given increased risk factors for dysphagia with pts hx and clinincal s/sx this date, no further PO trials given. Recommend MBSS to objectively assess oropharyngeal function. SLP to complete MBSS this afternoon. Pt okay for single ice chips with supervision from family or staff following diligent oral care as tolerated. Meds okay crushed in puree until completion of MBSS to guide diet recommendations. SLP Visit Diagnosis: Dysphagia, oral phase (R13.11);Dysphagia, unspecified (R13.10)    Aspiration Risk  Moderate aspiration risk    Diet Recommendation   NPO; MBSS this afternoon to guide diet recommendations  Medication Administration: Crushed with puree    Other  Recommendations Oral Care Recommendations: Oral care prior to ice chip/H20;Staff/trained caregiver to provide oral care    Recommendations for follow up therapy are one component of a multi-disciplinary discharge planning process, led by the attending physician.  Recommendations may be  updated based on patient status, additional functional criteria and insurance authorization.  Follow up Recommendations Acute inpatient rehab (3hours/day)      Assistance Recommended at Discharge Frequent or constant  Supervision/Assistance  Functional Status Assessment Patient has had a recent decline in their functional status and demonstrates the ability to make significant improvements in function in a reasonable and predictable amount of time.  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 Per MRI of head with multiple small foci of acute ischemia within both cerebral and  cerebellar hemispheres and Multiple old small vessel infarcts and findings of chronic ischemic microangiopathy. Type of Study: Bedside Swallow Evaluation Previous Swallow Assessment: none on file Diet Prior to this Study: NPO Temperature Spikes Noted: No Respiratory Status: Room air History of Recent Intubation: No Behavior/Cognition: Alert;Requires cueing (very HOH) Oral Cavity Assessment: Dry (xerostomia from prior XRT) Oral Care Completed by SLP: Yes Oral Cavity - Dentition: Missing dentition (states has had dentition loss during XRT and after tx, recommend dentistry follow up) Vision: Functional for self-feeding Self-Feeding Abilities: Needs assist Patient Positioning: Upright in chair Baseline Vocal Quality: Low vocal intensity Volitional Cough: Weak Volitional Swallow: Able to elicit    Oral/Motor/Sensory Function Overall Oral Motor/Sensory Function: Moderate impairment Facial ROM: Reduced right;Suspected CN  VII (facial) dysfunction Facial Symmetry: Abnormal symmetry right Facial Strength: Reduced right Facial Sensation: Reduced right Lingual ROM: Reduced right Lingual Strength: Reduced   Ice Chips Ice chips: Impaired Presentation: Spoon Oral Phase Impairments: Reduced lingual movement/coordination Oral Phase Functional Implications: Prolonged oral transit Pharyngeal Phase Impairments: Multiple swallows;Suspected delayed Swallow   Thin Liquid Thin Liquid: Impaired Presentation: Cup;Straw Oral Phase Impairments: Reduced lingual movement/coordination Oral Phase Functional Implications: Prolonged oral transit Pharyngeal  Phase Impairments: Suspected delayed Swallow;Multiple swallows;Cough - Immediate;Cough - Delayed;Decreased hyoid-laryngeal movement    Nectar Thick Nectar Thick Liquid: Not tested   Honey Thick Honey Thick Liquid: Not tested   Puree Puree: Impaired Presentation: Self Fed Oral Phase Impairments: Reduced labial seal;Reduced lingual movement/coordination Oral Phase Functional Implications: Oral residue Pharyngeal Phase Impairments: Suspected delayed Swallow;Multiple swallows   Solid     Solid: Not tested      Hayden Rasmussen MA, CCC-SLP Acute Rehabilitation Services   07/23/2021,12:44 PM

## 2021-07-24 ENCOUNTER — Other Ambulatory Visit (HOSPITAL_COMMUNITY): Payer: Self-pay

## 2021-07-24 DIAGNOSIS — I639 Cerebral infarction, unspecified: Secondary | ICD-10-CM | POA: Diagnosis not present

## 2021-07-24 DIAGNOSIS — I10 Essential (primary) hypertension: Secondary | ICD-10-CM | POA: Diagnosis not present

## 2021-07-24 DIAGNOSIS — I480 Paroxysmal atrial fibrillation: Secondary | ICD-10-CM | POA: Diagnosis not present

## 2021-07-24 MED ORDER — MELATONIN 5 MG PO TABS
5.0000 mg | ORAL_TABLET | Freq: Every day | ORAL | Status: DC
  Administered 2021-07-24 – 2021-07-28 (×5): 5 mg via ORAL
  Filled 2021-07-24 (×5): qty 1

## 2021-07-24 MED ORDER — AMIODARONE HCL 200 MG PO TABS: 200.0000 mg | ORAL_TABLET | Freq: Every day | ORAL | Status: DC

## 2021-07-24 MED ORDER — TRAZODONE HCL 50 MG PO TABS
100.0000 mg | ORAL_TABLET | Freq: Every evening | ORAL | Status: DC | PRN
  Administered 2021-07-25 – 2021-07-26 (×2): 100 mg via ORAL
  Filled 2021-07-24 (×2): qty 2

## 2021-07-24 MED ORDER — AMIODARONE HCL 200 MG PO TABS
200.0000 mg | ORAL_TABLET | Freq: Two times a day (BID) | ORAL | Status: DC
  Administered 2021-07-24 (×2): 200 mg via ORAL
  Filled 2021-07-24 (×2): qty 1

## 2021-07-24 NOTE — Progress Notes (Signed)
Physical Therapy Treatment Patient Details Name: Donald Hawkins MRN: [id number] DOB: [birth year] Today's Date: 07/24/2021   History of Present Illness Pt is a 88 year old man admitted with R side weakness, dysarthria and R facial droop on 07/22/21. MRI+ multiple small foci infarcts in B cerebrum and cerebellum. 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    PT Comments    Pt required min assist bed mobility, mod assist +2 safety sit to stand, and mod assist +2 safety/close chair follow ambulation 10' with RW. No knee buckling noted RLE. Assist required for L lateral weight shift and progression of RLE through swing phase during gait. Pt in recliner with feet elevated at end of session.    Recommendations for follow up therapy are one component of a multi-disciplinary discharge planning process, led by the attending physician.  Recommendations may be updated based on patient status, additional functional criteria and insurance authorization.  Follow Up Recommendations  Acute inpatient rehab (3hours/day)     Assistance Recommended at Discharge Frequent or constant Supervision/Assistance  Patient can return home with the following A lot of help with bathing/dressing/bathroom;Two people to help with walking and/or transfers;Assist for transportation;Help with stairs or ramp for entrance   Equipment Recommendations  Rolling walker (2 wheels)    Recommendations for Other Services Rehab consult     Precautions / Restrictions Precautions Precautions: Fall     Mobility  Bed Mobility Overal bed mobility: Needs Assistance Bed Mobility: Supine to Sit     Supine to sit: Min assist, HOB elevated     General bed mobility comments: +rail, cues for sequencing, assist to elevate trunk    Transfers Overall transfer level: Needs assistance Equipment used: Rolling walker (2 wheels) Transfers: Sit to/from Stand Sit to Stand: Mod assist, +2  safety/equipment           General transfer comment: cues for hand placement/sequencing, assist to power up and stabilize balance, increased time    Ambulation/Gait Ambulation/Gait assistance: Mod assist, +2 safety/equipment Gait Distance (Feet): 10 Feet Assistive device: Rolling walker (2 wheels) Gait Pattern/deviations: Step-to pattern, Step-through pattern, Decreased stride length, Decreased weight shift to right, Decreased step length - right, Shuffle Gait velocity: very slow Gait velocity interpretation: <1.8 ft/sec, indicate of risk for recurrent falls   General Gait Details: assist for RW management and balance, continual cues for posture, assist with L lateral weight shift and progression of RLE through swing phase   Stairs             Wheelchair Mobility    Modified Rankin (Stroke Patients Only) Modified Rankin (Stroke Patients Only) Pre-Morbid Rankin Score: Moderate disability Modified Rankin: Moderately severe disability     Balance Overall balance assessment: Needs assistance Sitting-balance support: Feet supported, Bilateral upper extremity supported Sitting balance-Leahy Scale: Fair     Standing balance support: Bilateral upper extremity supported, During functional activity, Reliant on assistive device for balance Standing balance-Leahy Scale: Poor                              Cognition Arousal/Alertness: Awake/alert Behavior During Therapy: WFL for tasks assessed/performed Overall Cognitive Status: Impaired/Different from baseline Area of Impairment: Problem solving, Safety/judgement, Following commands, Awareness                       Following Commands: Follows one step commands with increased time Safety/Judgement:  Decreased awareness of safety, Decreased awareness of deficits Awareness: Intellectual Problem Solving: Slow processing, Requires verbal cues          Exercises      General Comments General comments  (skin integrity, edema, etc.): VSS on RA      Pertinent Vitals/Pain Pain Assessment Pain Assessment: Faces Pain Score: 0-No pain    Home Living                          Prior Function            PT Goals (current goals can now be found in the care plan section) Acute Rehab PT Goals Patient Stated Goal: to go to rehab Progress towards PT goals: Progressing toward goals    Frequency    Min 4X/week      PT Plan Current plan remains appropriate    Co-evaluation              AM-PAC PT "6 Clicks" Mobility   Outcome Measure  Help needed turning from your back to your side while in a flat bed without using bedrails?: None Help needed moving from lying on your back to sitting on the side of a flat bed without using bedrails?: A Little Help needed moving to and from a bed to a chair (including a wheelchair)?: A Lot Help needed standing up from a chair using your arms (e.g., wheelchair or bedside chair)?: A Lot Help needed to walk in hospital room?: Total Help needed climbing 3-5 steps with a railing? : Total 6 Click Score: 13    End of Session Equipment Utilized During Treatment: Gait belt Activity Tolerance: Patient tolerated treatment well Patient left: in chair;with call bell/phone within reach;with chair alarm set;with family/visitor present Nurse Communication: Mobility status PT Visit Diagnosis: Unsteadiness on feet (R26.81);Muscle weakness (generalized) (M62.81);Hemiplegia and hemiparesis Hemiplegia - Right/Left: Right Hemiplegia - dominant/non-dominant: Dominant Hemiplegia - caused by: Cerebral infarction     Time: 1112-1139 PT Time Calculation (min) (ACUTE ONLY): 27 min  Charges:  $Gait Training: 23-37 mins                     Lorrin Goodell, PT  Office # [phone] Pager [phone]    Lorriane Shire 07/24/2021, 12:44 PM

## 2021-07-24 NOTE — TOC Benefit Eligibility Note (Signed)
Patient Advocate Encounter  Prior Authorization for Dabigatran Etexilate Mesylate '150MG'$  capsules has been approved.    PA# [id number] Effective dates: 07/24/2021 through 03/07/2022  Patients co-pay is $100.
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
## 2021-07-24 NOTE — Telephone Encounter (Signed)
See notes from pre op provider. At this time I will remove from the call back pool. Will address once the pt has been scheduled after d/c from hospital. I will send FYI to requesting office .

## 2021-07-24 NOTE — Progress Notes (Signed)
Inpatient Rehab Admissions:  Inpatient Rehab Consult received.  I met with pt and daughter-in-law Vivian at the bedside for rehabilitation assessment and to discuss goals and expectations of an inpatient rehab admission.  Vivian acknowledged understanding of CIR goals and expectations. She would like to discuss with her husband to determine if family would be able to provide 24/7 support for pt after discharge. Will continue to follow.  Signed:  Graves Madden, MS, CCC-SLP Admissions Coordinator [phone]   

## 2021-07-24 NOTE — Progress Notes (Signed)
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  Work-up significant for cardioembolic acute CVA, he was admitted for further management.     Assessment & Plan:   Principal Problem:   Acute cardioembolic stroke [facility]) Active Problems:   Paroxysmal atrial fibrillation (HCC)   Essential hypertension   Metastatic squamous cell carcinoma to head and neck (HCC)   Long term current use of anticoagulant therapy   Glaucoma   Acute embolic stroke ([place])   Protein-calorie malnutrition, severe    Acute cardioembolic stroke ([place]) -MRI brain significant for multiple small foci of acute ischemia within both cerebral and cerebellar hemispheres, pattern suggestive of emboli from cardiac or proximal aortic source. -Patient on Eliquis, reports compliance, so this would be considered Eliquis failure, transitioned to Pradaxa as discussed with neurology. -Remains n.p.o. after SLP evaluation, plan for MBSS today, if fails likely will need core track. -Follow on 2D echo. -LDL is 74, started on low-dose statin - A1c is 5.5 last April -Seen by PT/OT, recommendations for CIR.    Paroxysmal atrial fibrillation (HCC) -Patient with some RVR today, will start on amiodarone, if blood pressure allows we will add beta-blockers. -See above discussion regarding Eliquis versus Pradaxa     Essential hypertension Permissive  hypertension PRN IV Hydralazine for SBP > 220 or DBP > 115   Metastatic squamous cell carcinoma to head and neck (HCC) Diagnosed 2022 status post left superficial parotidectomy and a modified radical neck dissection completed on January 26, 2021 S/P radiation therapy with Dr. Isidore Moos Continue outpatient follow up with Dr. Alen Blew   Glaucoma Home regimen of eye drops resumed.   Long term current use of anticoagulant therapy On Chronic Eliquis for atrial fibrillation, CHADSVASC score of 3.  Holding for now per Neurology recommendations     DVT prophylaxis: Eliquis>> pradaxa Code Status: Full Family Communication: Son at bedside Disposition:   Status is: Inpatient Remains inpatient appropriate because: Patient can be discharged to CIR when bed is available.   Consultants:  Neurology   Subjective:  No significant events overnight, he passed a swallow evaluation yesterday, he reports insomnia.  Objective: Vitals:   07/23/21 2325 07/24/21 0310 07/24/21 0721 07/24/21 1133  BP: (!) 130/92 (!) 163/99 (!) 158/101 115/78  Pulse: 76 92 98 76  Resp: '17 18 18 '$ (!) 21  Temp: 97.7 F (36.5 C) 97.9 F (36.6 C) 98 F (36.7 C) 97.6 F (36.4 C)  TempSrc: Oral Oral Oral Oral  SpO2: 96% 99% 95% 94%  Weight:      Height:        Intake/Output Summary (Last 24 hours) at 07/24/2021 1355 Last data filed at 07/24/2021 0942 Gross per 24 hour  Intake 808.26 ml  Output 1200 ml  Net -391.74 ml   Filed Weights   07/23/21 0100  Weight: 62.5 kg    Examination:  Awake Alert, Oriented X 3, No new F.N deficits, mains with significant dysarthria Symmetrical Chest wall movement, Good air movement bilaterally, CTAB  RRR,No Gallops,Rubs or new Murmurs, No Parasternal Heave +ve B.Sounds, Abd Soft, No tenderness, No rebound - guarding or rigidity. No Cyanosis, Clubbing or edema, No new Rash or bruise       Data Reviewed: I have personally reviewed following labs and imaging  studies  CBC: Recent Labs  Lab 07/22/21 1632 07/22/21 1931 07/23/21 0406  WBC 6.4  --  6.3  NEUTROABS  --   --  3.7  HGB 13.9 13.9 13.6  HCT 42.3 41.0 38.7*  MCV 98.8  --  93.0  PLT 155  --  136*    Basic Metabolic Panel: Recent Labs  Lab 07/22/21 1632 07/22/21 1931 07/23/21 0406  NA 136 137 138  K 4.0 4.0 3.6  CL 105 106 110  CO2 21*  --  24  GLUCOSE 120* 104* 93  BUN '10 11 8  '$ CREATININE 0.82 0.80 0.86  CALCIUM 9.3  --  9.0  MG  --   --  1.9    GFR: Estimated Creatinine Clearance: 49.5 mL/min (by C-G formula based on SCr of 0.86 mg/dL).  Liver Function Tests: Recent Labs  Lab 07/22/21 1632 07/23/21 0406  AST 25 21  ALT 12 11  ALKPHOS 82 70  BILITOT 0.8 1.0  PROT 7.0 6.3*  ALBUMIN 3.4* 3.0*    CBG: Recent Labs  Lab 07/22/21 1629  GLUCAP 120*     Recent Results (from the past 240 hour(s))  Resp Panel by RT-PCR (Flu A&B, Covid) Nasopharyngeal Swab     Status: None   Collection Time: 07/22/21  9:08 PM   Specimen: Nasopharyngeal Swab; Nasopharyngeal(NP) swabs in vial transport medium  Result Value Ref Range Status   SARS Coronavirus 2 by RT PCR NEGATIVE NEGATIVE Final    Comment: (NOTE) SARS-CoV-2 target nucleic acids are NOT DETECTED.  The SARS-CoV-2 RNA is generally detectable in upper respiratory specimens during the acute phase of infection. The lowest concentration of SARS-CoV-2 viral copies this assay can detect is 138 copies/mL. A negative result does not preclude SARS-Cov-2 infection and should not be used as the sole basis for treatment or other patient management decisions. A negative result may occur with  improper specimen collection/handling, submission of specimen other than nasopharyngeal swab, presence of viral mutation(s) within the areas targeted by this assay, and inadequate number of viral copies(<138 copies/mL). A negative result must be combined with clinical observations, patient history, and epidemiological information.  The expected result is Negative.  Fact Sheet for Patients:  EntrepreneurPulse.com.au  Fact Sheet for Healthcare Providers:  IncredibleEmployment.be  This test is no t yet approved or cleared by the [place] FDA and  has been authorized for detection and/or diagnosis of SARS-CoV-2 by FDA under an Emergency Use Authorization (EUA). This EUA will remain  in effect (meaning this test can be used) for the duration of the COVID-19 declaration under Section 564(b)(1) of the Act, 21 U.S.C.section 360bbb-3(b)(1), unless the authorization is terminated  or revoked sooner.       Influenza A by PCR NEGATIVE NEGATIVE Final   Influenza B by PCR NEGATIVE NEGATIVE Final    Comment: (NOTE) The Xpert Xpress SARS-CoV-2/FLU/RSV plus assay is intended as an aid in the diagnosis of influenza from Nasopharyngeal swab specimens and should not be used as a sole basis for treatment. Nasal washings and aspirates are unacceptable for Xpert Xpress SARS-CoV-2/FLU/RSV testing.  Fact Sheet for Patients: EntrepreneurPulse.com.au  Fact Sheet for Healthcare Providers: IncredibleEmployment.be  This test is not yet approved or cleared by  the [employer] and has been authorized for detection and/or diagnosis of SARS-CoV-2 by FDA under an Emergency Use Authorization (EUA). This EUA will remain in effect (meaning this test can be used) for the duration of the COVID-19 declaration under Section 564(b)(1) of the Act, 21 U.S.C. section 360bbb-3(b)(1), unless the authorization is terminated or revoked.  Performed at [facility], [address] [place], [place] [address]          Radiology Studies: MR BRAIN WO CONTRAST  Result Date: 07/22/2021 CLINICAL DATA:  Facial droop and slurred speech. EXAM: MRI HEAD WITHOUT CONTRAST TECHNIQUE: Multiplanar, multiecho pulse sequences of the brain and surrounding structures were  obtained without intravenous contrast. COMPARISON:  Head CT and CT perfusion 07/22/2021 FINDINGS: Brain: Multiple small foci of acute ischemia within both cerebral and cerebellar hemispheres. Chronic blood products at old left occipital infarct. A few scattered foci of chronic microhemorrhage. There is multifocal hyperintense T2-weighted signal within the white matter. Generalized cerebral volume loss. Multiple old small vessel infarcts of the corona radiata. The midline structures are normal. Vascular: Major flow voids are preserved. Skull and upper cervical spine: Normal calvarium and skull base. Visualized upper cervical spine and soft tissues are normal. Sinuses/Orbits:Left mastoid effusion.  Normal orbits. IMPRESSION: 1. Multiple small foci of acute ischemia within both cerebral and cerebellar hemispheres. The pattern is suggestive of emboli from a cardiac or proximal aortic source. 2. Multiple old small vessel infarcts and findings of chronic ischemic microangiopathy. Electronically Signed   By: Ulyses Jarred M.D.   On: 07/22/2021 20:35   DG Swallowing Func-Speech Pathology  Result Date: 07/23/2021 Table formatting from the original result was not included. Objective Swallowing Evaluation: Type of Study: MBS-Modified Barium Swallow Study  Patient Details Name: Coran Dipaola MRN: [id number] Date of Birth: February 25, 1929 Today's Date: 07/23/2021 Time: SLP Start Time (ACUTE ONLY): 1310 -SLP Stop Time (ACUTE ONLY): 1350 SLP Time Calculation (min) (ACUTE ONLY): 40 min Past Medical History: Past Medical History: Diagnosis Date  Cancer ([place])   skin top of head - squamous and basal. "Squamous on left neck"  Depression   Hernia, inguinal   Hypertension  Past Surgical History: Past Surgical History: Procedure Laterality Date  ADJACENT TISSUE TRANSFER/TISSUE REARRANGEMENT  01/26/2021  Procedure: Tissue rearrangement Left neck 60 sq cm.;  Surgeon: Melida Quitter, MD;  Location: [place];  Service: ENT;;  CATARACT EXTRACTION    EYE  SURGERY    NASAL SEPTUM SURGERY    40 yrs ago  PAROTIDECTOMY Left 01/26/2021  Procedure: LEFT PAROTIDECTOMY;  Surgeon: Melida Quitter, MD;  Location: [place];  Service: ENT;  Laterality: Left;  RADICAL NECK DISSECTION Left 01/26/2021  Procedure: MODIFIED RADICAL NECK DISSECTION;  Surgeon: Melida Quitter, MD;  Location: [facility] OR;  Service: ENT;  Laterality: Left; HPI: 88 year old male with past medical history of 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 Per MRI of head with multiple small foci of acute ischemia within both cerebral and  cerebellar hemispheres and Multiple old small vessel infarcts and findings of chronic ischemic microangiopathy.  Subjective: alert, upright in chair for procedure  Recommendations for follow up therapy are one component of a multi-disciplinary discharge planning process, led by the attending physician.  Recommendations may be updated based on patient status, additional functional criteria and insurance authorization. Assessment / Plan / Recommendation   07/23/2021   2:00 PM Clinical Impressions Clinical Impression Pt presents with a mild oral dysphagia and moderate pharyngeal dysphagia likely of multifactorial etiology including prior head and neck cancer with XRT treatment, acute CVA, and pt deconditioning. Oral deficits included delayed bolus transit and mild oral residuals. Pharyngeal deficits c/b reduced timing and efficiency of laryngeal vestibule closure, reduced base of tongue retraction, decreased laryngeal elevation, reduced pharyngeal stripping wave, and unreliable laryngeal sensation. This allowed for pre swallow laryngeal  penetration with thin liquids to the level of the vocal cords with post swallow aspiration (PAS-8) milder amounts. Pt was able to clear aspirates with cueing to cough and volitionally swallow. Directed for use of chin tuck however pt could not consistenly implement. With thicker viscosities, pt with improved pre swallow airway protection, however post swallow residual spillage consistently occured with tracheal aspiration and increased pharyngeal residuals (nectar thick, honey thick, puree). Given that aspiration occured in smaller amounts across boluses (excluding small trial of mechanical soft), recommend dysphagia 3 (mechanical soft) thin liquids with strict adherence to safe swallow precautions. ( no straws, 1:1 supervision, cough/clear throat with all swallows, small sips and bites, swallow x2).  SLP to closely follow for tolerance, family and pt education. SLP Visit Diagnosis Dysphagia, oropharyngeal phase (R13.12) Impact on safety and function Moderate aspiration risk     07/23/2021   2:00 PM Treatment Recommendations Treatment Recommendations Therapy as outlined in treatment plan below     07/23/2021   2:00 PM Prognosis Prognosis for Safe Diet Advancement Good Barriers to Reach Goals Cognitive deficits;Time post onset   07/23/2021   2:00 PM Diet Recommendations SLP Diet Recommendations Thin liquid;Dysphagia 3 (Mech soft) solids Liquid Administration via Cup;No straw Medication Administration Crushed with puree Compensations Small sips/bites;Slow rate;Minimize environmental distractions;Clear throat intermittently;Hard cough after swallow;Effortful swallow;Multiple dry swallows after each bite/sip Postural Changes Seated upright at 90 degrees;Remain semi-upright after after feeds/meals (Comment)     07/23/2021   2:00 PM Other Recommendations Oral Care Recommendations Oral care BID Follow Up Recommendations Acute inpatient rehab (3hours/day) Assistance recommended at discharge Frequent or constant  Supervision/Assistance Functional Status Assessment Patient has had a recent decline in their functional status and demonstrates the ability to make significant improvements in function in a reasonable and predictable amount of time.   07/23/2021   2:00 PM Frequency and Duration  Speech Therapy Frequency (ACUTE ONLY) min 2x/week Treatment Duration 2 weeks     07/23/2021   2:00 PM Oral Phase Oral Phase Impaired Oral - Honey Cup Weak lingual manipulation;Delayed oral transit Oral - Nectar Cup Delayed oral transit;Lingual/palatal residue Oral - Thin Cup Lingual/palatal residue;Delayed oral transit Oral - Thin Straw Delayed oral transit Oral - Puree Delayed oral transit;Lingual/palatal residue Oral - Mech Soft Lingual/palatal residue;Decreased bolus cohesion    07/23/2021   2:00 PM Pharyngeal Phase Pharyngeal Phase Impaired Pharyngeal- Honey Cup Reduced laryngeal elevation;Reduced tongue base retraction;Penetration/Apiration after swallow;Pharyngeal residue - valleculae;Pharyngeal residue - pyriform;Reduced epiglottic inversion;Reduced pharyngeal peristalsis Pharyngeal Material does not enter airway;Material enters airway, passes BELOW cords without attempt by patient to eject out (silent aspiration) Pharyngeal- Nectar Cup Reduced epiglottic inversion;Reduced tongue base retraction;Pharyngeal residue - pyriform;Pharyngeal residue - valleculae;Reduced pharyngeal peristalsis;Reduced laryngeal elevation;Penetration/Apiration after swallow Pharyngeal Material enters airway, CONTACTS cords and not ejected out;Material enters airway, passes BELOW cords without attempt by patient to eject out (silent aspiration) Pharyngeal- Thin Cup Penetration/Aspiration before swallow;Penetration/Apiration after  swallow;Penetration/Aspiration during swallow;Reduced epiglottic inversion;Reduced tongue base retraction;Reduced pharyngeal peristalsis;Reduced airway/laryngeal closure;Pharyngeal residue - valleculae;Pharyngeal residue - pyriform  Pharyngeal Material enters airway, CONTACTS cords and not ejected out;Material enters airway, passes BELOW cords without attempt by patient to eject out (silent aspiration) Pharyngeal- Thin Straw Penetration/Aspiration before swallow;Penetration/Aspiration during swallow;Pharyngeal residue - pyriform;Pharyngeal residue - valleculae;Reduced epiglottic inversion;Reduced tongue base retraction;Reduced laryngeal elevation Pharyngeal Material enters airway, CONTACTS cords and not ejected out Pharyngeal- Puree Delayed swallow initiation-vallecula;Penetration/Apiration after swallow;Pharyngeal residue - valleculae;Pharyngeal residue - pyriform;Reduced epiglottic inversion;Reduced tongue base retraction;Reduced laryngeal elevation Pharyngeal Material enters airway, remains ABOVE vocal cords and not ejected out Pharyngeal- Mechanical Soft Pharyngeal residue - pyriform;Pharyngeal residue - valleculae;Reduced pharyngeal peristalsis;Reduced laryngeal elevation    07/23/2021   2:00 PM Cervical Esophageal Phase  Cervical Esophageal Phase [facility] Chelsea E Hartness MA, CCC-SLP 07/23/2021, 2:41 PM                     ECHOCARDIOGRAM COMPLETE BUBBLE STUDY  Result Date: 07/23/2021    ECHOCARDIOGRAM REPORT   Patient Name:   KOLSTON LACOUNT Date of Exam: 07/23/2021 Medical Rec #:  [id number]   Height:       70.0 in Accession #:    [id number]  Weight:       137.8 lb Date of Birth:  Oct 16, 1928   BSA:          1.782 m Patient Age:    45 years    BP:           143/95 mmHg Patient Gender: M           HR:           75 bpm. Exam Location:  Inpatient Procedure: 2D Echo, Saline Contrast Bubble Study, Cardiac Doppler and Color            Doppler Indications:    Stroke  History:        Patient has no prior history of Echocardiogram examinations.                 Abnormal ECG, Stroke, Arrythmias:Atrial Fibrillation; Risk                 Factors:Hypertension. Cancer.  Sonographer:    Roseanna Rainbow RDCS Referring Phys: [id number] [place]  Sonographer  Comments: Technically difficult study due to poor echo windows. VEry low parasternal window. IMPRESSIONS  1. Left ventricular ejection fraction, by estimation, is 60 to 65%. The left ventricle has normal function. The left ventricle has no regional wall motion abnormalities. There is mild left ventricular hypertrophy. Left ventricular diastolic parameters are consistent with Grade I diastolic dysfunction (impaired relaxation).  2. Right ventricular systolic function is normal. The right ventricular size is normal. There is normal pulmonary artery systolic pressure.  3. Right atrial size was mildly dilated.  4. The mitral valve is normal in structure. No evidence of mitral valve regurgitation. No evidence of mitral stenosis.  5. Some nodular calcification of AV with shadowing in LVOT likely from calcium . The aortic valve is abnormal. There is moderate calcification of the aortic valve. There is moderate thickening of the aortic valve. Aortic valve regurgitation is trivial. Mild aortic valve stenosis.  6. The inferior vena cava is normal in size with greater than 50% respiratory variability, suggesting right atrial pressure of 3 mmHg.  7. Agitated saline contrast bubble study was negative, with no evidence of any interatrial shunt. FINDINGS  Left Ventricle: Left ventricular ejection fraction, by estimation, is 60  to 65%. The left ventricle has normal function. The left ventricle has no regional wall motion abnormalities. The left ventricular internal cavity size was normal in size. There is  mild left ventricular hypertrophy. Left ventricular diastolic parameters are consistent with Grade I diastolic dysfunction (impaired relaxation). Right Ventricle: The right ventricular size is normal. No increase in right ventricular wall thickness. Right ventricular systolic function is normal. There is normal pulmonary artery systolic pressure. The tricuspid regurgitant velocity is 2.50 m/s, and  with an assumed right atrial  pressure of 8 mmHg, the estimated right ventricular systolic pressure is 67.8 mmHg. Left Atrium: Left atrial size was normal in size. Right Atrium: Right atrial size was mildly dilated. Pericardium: There is no evidence of pericardial effusion. Mitral Valve: The mitral valve is normal in structure. No evidence of mitral valve regurgitation. No evidence of mitral valve stenosis. MV peak gradient, 10.4 mmHg. The mean mitral valve gradient is 5.0 mmHg. Tricuspid Valve: The tricuspid valve is normal in structure. Tricuspid valve regurgitation is mild . No evidence of tricuspid stenosis. Aortic Valve: Some nodular calcification of AV with shadowing in LVOT likely from calcium. The aortic valve is abnormal. There is moderate calcification of the aortic valve. There is moderate thickening of the aortic valve. Aortic valve regurgitation is trivial. Mild aortic stenosis is present. Aortic valve mean gradient measures 9.0 mmHg. Aortic valve peak gradient measures 18.4 mmHg. Aortic valve area, by VTI measures 2.70 cm. Pulmonic Valve: The pulmonic valve was normal in structure. Pulmonic valve regurgitation is not visualized. No evidence of pulmonic stenosis. Aorta: The aortic root is normal in size and structure. Venous: The inferior vena cava is normal in size with greater than 50% respiratory variability, suggesting right atrial pressure of 3 mmHg. IAS/Shunts: No atrial level shunt detected by color flow Doppler. Agitated saline contrast was given intravenously to evaluate for intracardiac shunting. Agitated saline contrast bubble study was negative, with no evidence of any interatrial shunt.  LEFT VENTRICLE PLAX 2D LVIDd:         4.30 cm LVIDs:         2.80 cm LV PW:         1.30 cm LV IVS:        1.20 cm LVOT diam:     2.60 cm LV SV:         109 LV SV Index:   61 LVOT Area:     5.31 cm  LV Volumes (MOD) LV vol d, MOD A2C: 74.7 ml LV vol d, MOD A4C: 79.3 ml LV vol s, MOD A2C: 30.2 ml LV vol s, MOD A4C: 25.6 ml LV SV MOD  A2C:     44.5 ml LV SV MOD A4C:     79.3 ml LV SV MOD BP:      50.9 ml RIGHT VENTRICLE            IVC RV S prime:     9.23 cm/s  IVC diam: 2.40 cm TAPSE (M-mode): 1.4 cm LEFT ATRIUM           Index        RIGHT ATRIUM           Index LA diam:      3.30 cm 1.85 cm/m   RA Area:     23.80 cm LA Vol (A2C): 32.9 ml 18.47 ml/m  RA Volume:   64.00 ml  35.92 ml/m LA Vol (A4C): 70.5 ml 39.57 ml/m  AORTIC VALVE AV Area (Vmax):  2.75 cm AV Area (Vmean):   3.10 cm AV Area (VTI):     2.70 cm AV Vmax:           214.50 cm/s AV Vmean:          137.750 cm/s AV VTI:            0.403 m AV Peak Grad:      18.4 mmHg AV Mean Grad:      9.0 mmHg LVOT Vmax:         111.00 cm/s LVOT Vmean:        80.500 cm/s LVOT VTI:          0.205 m LVOT/AV VTI ratio: 0.51  AORTA Ao Root diam: 3.20 cm MITRAL VALVE               TRICUSPID VALVE MV Area (PHT): 2.42 cm    TR Peak grad:   25.0 mmHg MV Area VTI:   5.21 cm    TR Vmax:        250.00 cm/s MV Peak grad:  10.4 mmHg MV Mean grad:  5.0 mmHg    SHUNTS MV Vmax:       1.61 m/s    Systemic VTI:  0.20 m MV Vmean:      94.8 cm/s   Systemic Diam: 2.60 cm MV Decel Time: 313 msec MV E velocity: 76.70 cm/s Jenkins Rouge MD Electronically signed by Jenkins Rouge MD Signature Date/Time: 07/23/2021/10:25:04 AM    Final    CT HEAD CODE STROKE WO CONTRAST  Result Date: 07/22/2021 CLINICAL DATA:  Code stroke.  Right-sided facial droop EXAM: CT HEAD WITHOUT CONTRAST TECHNIQUE: Contiguous axial images were obtained from the base of the skull through the vertex without intravenous contrast. RADIATION DOSE REDUCTION: This exam was performed according to the departmental dose-optimization program which includes automated exposure control, adjustment of the mA and/or kV according to patient size and/or use of iterative reconstruction technique. COMPARISON:  None Available. FINDINGS: Brain: There is no mass, hemorrhage or extra-axial collection. The size and configuration of the ventricles and extra-axial CSF  spaces are normal. There is an age indeterminate left occipital infarct favored to be late subacute to chronic. No visible infarct within the left MCA territory. Vascular: No abnormal hyperdensity of the major intracranial arteries or dural venous sinuses. No intracranial atherosclerosis. Skull: The visualized skull base, calvarium and extracranial soft tissues are normal. Sinuses/Orbits: No fluid levels or advanced mucosal thickening of the visualized paranasal sinuses. No mastoid or middle ear effusion. The orbits are normal. ASPECTS [facility] Stroke Program Early CT Score) - Ganglionic level infarction (caudate, lentiform nuclei, internal capsule, insula, M1-M3 cortex): 7 - Supraganglionic infarction (M4-M6 cortex): 3 Total score (0-10 with 10 being normal): 10 IMPRESSION: 1. No acute hemorrhage. 2. Age indeterminate left occipital infarct, favored to be late subacute to chronic. 3. ASPECTS is 10. These results were called by telephone at the time of interpretation on 07/22/2021 at 7:41 pm to provider [facility] , who verbally acknowledged these results. Electronically Signed   By: Ulyses Jarred M.D.   On: 07/22/2021 19:42   CT ANGIO HEAD NECK W WO CM W PERF (CODE STROKE)  Addendum Date: 07/22/2021   ADDENDUM REPORT: 07/22/2021 20:36 ADDENDUM: Correction to findings above: There is bilateral carotid bifurcation atherosclerosis without hemodynamically significant stenosis. Electronically Signed   By: Ulyses Jarred M.D.   On: 07/22/2021 20:36   Result Date: 07/22/2021 CLINICAL DATA:  Right facial droop EXAM: CT ANGIOGRAPHY  HEAD AND NECK CT PERFUSION BRAIN TECHNIQUE: Multidetector CT imaging of the head and neck was performed using the standard protocol during bolus administration of intravenous contrast. Multiplanar CT image reconstructions and MIPs were obtained to evaluate the vascular anatomy. Carotid stenosis measurements (when applicable) are obtained utilizing NASCET criteria, using the distal internal  carotid diameter as the denominator. Multiphase CT imaging of the brain was performed following IV bolus contrast injection. Subsequent parametric perfusion maps were calculated using RAPID software. RADIATION DOSE REDUCTION: This exam was performed according to the departmental dose-optimization program which includes automated exposure control, adjustment of the mA and/or kV according to patient size and/or use of iterative reconstruction technique. CONTRAST:  161m OMNIPAQUE IOHEXOL 350 MG/ML SOLN COMPARISON:  Head CT 07/22/2021 FINDINGS: CTA NECK FINDINGS SKELETON: There is no bony spinal canal stenosis. No lytic or blastic lesion. OTHER NECK: Normal pharynx, larynx and major salivary glands. No cervical lymphadenopathy. Unremarkable thyroid gland. UPPER CHEST: No pneumothorax or pleural effusion. No nodules or masses. AORTIC ARCH: There is no calcific atherosclerosis of the aortic arch. There is no aneurysm, dissection or hemodynamically significant stenosis of the visualized portion of the aorta. Conventional 3 vessel aortic branching pattern. The visualized proximal subclavian arteries are widely patent. RIGHT CAROTID SYSTEM: Normal without aneurysm, dissection or stenosis. LEFT CAROTID SYSTEM: Normal without aneurysm, dissection or stenosis. VERTEBRAL ARTERIES: Right dominant configuration. Moderate stenosis of the right vertebral artery origin. Left origin is normal. Both vertebral arteries are otherwise normal to the skull base. CTA HEAD FINDINGS There is a left mastoid effusion. POSTERIOR CIRCULATION: --Vertebral arteries: Normal V4 segments. --Inferior cerebellar arteries: Normal. --Basilar artery: Normal. --Superior cerebellar arteries: Normal. --Posterior cerebral arteries (PCA): Normal. ANTERIOR CIRCULATION: --Intracranial internal carotid arteries: Normal. --Anterior cerebral arteries (ACA): Normal. Both A1 segments are present. Patent anterior communicating artery (a-comm). --Middle cerebral  arteries (MCA): Normal. VENOUS SINUSES: As permitted by contrast timing, patent. ANATOMIC VARIANTS: Fetal origin of the right posterior cerebral artery. Review of the MIP images confirms the above findings. CT Brain Perfusion Findings: ASPECTS: 10 CBF (<30%) Volume: 078mPerfusion (Tmax>6.0s) volume: 40m62mismatch Volume: 40mL65mfarction Location:No core infarct. The calculated region of ischemia is located in the left frontal operculum. IMPRESSION: 1. No emergent large vessel occlusion or high-grade stenosis of the intracranial arteries. 2. No core infarct. 9 mL area of ischemic penumbra within the left frontal operculum. 3. Moderate stenosis of the right vertebral artery origin. Electronically Signed: By: KeviUlyses Jarred. On: 07/22/2021 20:04        Scheduled Meds:  brimonidine  1 drop Both Eyes BID   dabigatran  150 mg Oral Q12H   dorzolamide-timolol  1 drop Both Eyes BID   latanoprost  1 drop Both Eyes QHS   rosuvastatin  10 mg Oral Daily   Continuous Infusions:     LOS: 2 days       DawoPhillips Climes [facility]   To contact the attending provider between 7A-7P or the covering provider during after hours 7P-7A, please log into the web site [email or web address] and access using universal [place] password for that web site. If you do not have the password, please call the hospital operator.  07/24/2021, 1:55 PM

## 2021-07-25 ENCOUNTER — Inpatient Hospital Stay (HOSPITAL_COMMUNITY): Payer: Medicare Other

## 2021-07-25 DIAGNOSIS — R410 Disorientation, unspecified: Secondary | ICD-10-CM | POA: Diagnosis not present

## 2021-07-25 DIAGNOSIS — I639 Cerebral infarction, unspecified: Secondary | ICD-10-CM | POA: Diagnosis not present

## 2021-07-25 DIAGNOSIS — I10 Essential (primary) hypertension: Secondary | ICD-10-CM | POA: Diagnosis not present

## 2021-07-25 MED ORDER — HALOPERIDOL LACTATE 5 MG/ML IJ SOLN
1.0000 mg | INTRAMUSCULAR | Status: AC
  Administered 2021-07-25: 1 mg via INTRAVENOUS
  Filled 2021-07-25: qty 1

## 2021-07-25 MED ORDER — QUETIAPINE FUMARATE 25 MG PO TABS
25.0000 mg | ORAL_TABLET | Freq: Every day | ORAL | Status: DC
  Administered 2021-07-25: 25 mg via ORAL
  Filled 2021-07-25: qty 1

## 2021-07-25 MED ORDER — LORAZEPAM 2 MG/ML IJ SOLN
0.5000 mg | INTRAMUSCULAR | Status: AC
  Administered 2021-07-25: 0.5 mg via INTRAVENOUS
  Filled 2021-07-25: qty 1

## 2021-07-25 MED ORDER — SODIUM CHLORIDE 0.9 % IV SOLN
INTRAVENOUS | Status: DC
Start: 2021-07-25 — End: 2021-07-29

## 2021-07-25 NOTE — Progress Notes (Signed)
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  Work-up significant for cardioembolic acute CVA, he was admitted for further management.     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 -Patient on Eliquis, reports compliance, so this would be considered Eliquis failure, transitioned to Pradaxa as discussed with neurology. -Remains n.p.o. after SLP evaluation, plan for MBSS today, if fails likely will need core track. -Follow on 2D echo. -LDL is 74, started on low-dose statin - A1c is 5.5 last April -Seen by PT/OT, recommendations for CIR.    Paroxysmal atrial fibrillation (HCC) -Heart rate has been elevated, blood pressure has been on the lower side, so he was started on low-dose amiodarone to allow for permissive hypertension, as blood pressure is low and will not allow for any beta-blockers  currently.   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 Isidore Moos Continue outpatient follow up with Dr. Alen Blew   Glaucoma Home regimen of eye drops resumed.   Long term current use of anticoagulant therapy On Chronic Eliquis for atrial fibrillation, CHADSVASC score of 3.  Holding for now per Neurology recommendations     DVT prophylaxis: Eliquis>> pradaxa Code Status: Full Family Communication: Son at bedside Disposition:   Status is: Inpatient Remains inpatient appropriate because: Patient can be discharged to CIR when bed is available.   Consultants:  Neurology   Subjective:  Patient with an episode of hospital delirium, confusion overnight where he received IV Ativan.     Objective: Vitals:   07/24/21 2341 07/25/21 0303 07/25/21 0755 07/25/21 1159  BP: (!) 115/59 130/84 108/66 109/76  Pulse: 77 86 70 70  Resp: '18 20 17 17  '$ Temp: 97.9 F (36.6 C) 98 F (36.7 C) 98.3 F (36.8 C) (!) 97.5 F (36.4 C)  TempSrc: Oral Oral Axillary Axillary  SpO2: 95% 95% 94% 94%  Weight:      Height:        Intake/Output Summary (Last 24 hours) at 07/25/2021 1315 Last data filed at 07/24/2021 1806 Gross per 24 hour  Intake --  Output 700 ml  Net -700 ml  Filed Weights   07/23/21 0100  Weight: 62.5 kg    Examination:  Sleeping, open I briefly to loud verbal stimuli, then close, does not interact, answer any questions or follow any commands. Symmetrical Chest wall movement, Good air movement bilaterally, CTAB RRR,No Gallops,Rubs or new Murmurs, No  Parasternal Heave +ve B.Sounds, Abd Soft, No tenderness, No rebound - guarding or rigidity. No Cyanosis, Clubbing or edema, No new Rash or bruise        Data Reviewed: I have personally reviewed following labs and imaging studies  CBC: Recent Labs  Lab 07/22/21 1632 07/22/21 1931 07/23/21 0406  WBC 6.4  --  6.3  NEUTROABS  --   --  3.7  HGB 13.9 13.9 13.6  HCT 42.3 41.0 38.7*  MCV 98.8  --  93.0  PLT 155  --  136*    Basic Metabolic Panel: Recent Labs  Lab 07/22/21 1632 07/22/21 1931 07/23/21 0406  NA 136 137 138  K 4.0 4.0 3.6  CL 105 106 110  CO2 21*  --  24  GLUCOSE 120* 104* 93  BUN '10 11 8  '$ CREATININE 0.82 0.80 0.86  CALCIUM 9.3  --  9.0  MG  --   --  1.9    GFR: Estimated Creatinine Clearance: 49.5 mL/min (by C-G formula based on SCr of 0.86 mg/dL).  Liver Function Tests: Recent Labs  Lab 07/22/21 1632 07/23/21 0406  AST 25 21  ALT 12 11  ALKPHOS 82 70  BILITOT 0.8 1.0  PROT 7.0 6.3*  ALBUMIN 3.4* 3.0*    CBG: Recent Labs  Lab 07/22/21 1629  GLUCAP 120*     Recent Results (from the past 240 hour(s))  Resp Panel by RT-PCR (Flu A&B, Covid) Nasopharyngeal Swab     Status: None   Collection Time: 07/22/21  9:08 PM   Specimen: Nasopharyngeal Swab; Nasopharyngeal(NP) swabs in vial transport medium  Result Value Ref Range Status   SARS Coronavirus 2 by RT PCR NEGATIVE NEGATIVE Final    Comment: (NOTE) SARS-CoV-2 target nucleic acids are NOT DETECTED.  The SARS-CoV-2 RNA is generally detectable in upper respiratory specimens during the acute phase of infection. The lowest concentration of SARS-CoV-2 viral copies this assay can detect is 138 copies/mL. A negative result does not preclude SARS-Cov-2 infection and should not be used as the sole basis for treatment or other patient management decisions. A negative result may occur with  improper specimen collection/handling, submission of specimen other than nasopharyngeal swab, presence of  viral mutation(s) within the areas targeted by this assay, and inadequate number of viral copies(<138 copies/mL). A negative result must be combined with clinical observations, patient history, and epidemiological information. The expected result is Negative.  Fact Sheet for Patients:  EntrepreneurPulse.com.au  Fact Sheet for Healthcare Providers:  IncredibleEmployment.be  This test is no t yet approved or cleared by the [place] FDA and  has been authorized for detection and/or diagnosis of SARS-CoV-2 by FDA under an Emergency Use Authorization (EUA). This EUA will remain  in effect (meaning this test can be used) for the duration of the COVID-19 declaration under Section 564(b)(1) of the Act, 21 U.S.C.section 360bbb-3(b)(1), unless the authorization is terminated  or revoked sooner.       Influenza A by PCR NEGATIVE NEGATIVE Final   Influenza B by PCR NEGATIVE NEGATIVE Final    Comment: (NOTE) The Xpert Xpress SARS-CoV-2/FLU/RSV plus assay is intended as an aid in the diagnosis of influenza from Nasopharyngeal swab specimens  and should not be used as a sole basis for treatment. Nasal washings and aspirates are unacceptable for Xpert Xpress SARS-CoV-2/FLU/RSV testing.  Fact Sheet for Patients: EntrepreneurPulse.com.au  Fact Sheet for Healthcare Providers: IncredibleEmployment.be  This test is not yet approved or cleared by the [place] FDA and has been authorized for detection and/or diagnosis of SARS-CoV-2 by FDA under an Emergency Use Authorization (EUA). This EUA will remain in effect (meaning this test can be used) for the duration of the COVID-19 declaration under Section 564(b)(1) of the Act, 21 U.S.C. section 360bbb-3(b)(1), unless the authorization is terminated or revoked.  Performed at [facility], [address] [place], [place] [address]          Radiology  Studies: CT HEAD WO CONTRAST (5MM)  Result Date: 07/25/2021 CLINICAL DATA:  Stroke follow-up EXAM: CT HEAD WITHOUT CONTRAST TECHNIQUE: Contiguous axial images were obtained from the base of the skull through the vertex without intravenous contrast. RADIATION DOSE REDUCTION: This exam was performed according to the departmental dose-optimization program which includes automated exposure control, adjustment of the mA and/or kV according to patient size and/or use of iterative reconstruction technique. COMPARISON:  None Available. FINDINGS: Brain: There is no mass, hemorrhage or extra-axial collection. There is generalized atrophy without lobar predilection. Hypodensity of the white matter is most commonly associated with chronic microvascular disease. Vascular: No abnormal hyperdensity of the major intracranial arteries or dural venous sinuses. No intracranial atherosclerosis. Skull: The visualized skull base, calvarium and extracranial soft tissues are normal. Sinuses/Orbits: Left mastoid effusion.  The orbits are normal. IMPRESSION: 1. No acute intracranial abnormality. 2. Left mastoid effusion. 3. Hypodensity of the white matter is most commonly associated with chronic microvascular disease. Electronically Signed   By: Ulyses Jarred M.D.   On: 07/25/2021 03:03   DG Swallowing Func-Speech Pathology  Result Date: 07/23/2021 Table formatting from the original result was not included. 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 "Squamous on left neck"  Depression   Hernia, inguinal   Hypertension  Past Surgical History: Past Surgical History:  Procedure Laterality Date  ADJACENT TISSUE TRANSFER/TISSUE REARRANGEMENT  01/26/2021  Procedure: Tissue rearrangement Left neck 60 sq cm.;  Surgeon: Melida Quitter, MD;  Location: [place];  Service: ENT;;  CATARACT EXTRACTION    EYE SURGERY    NASAL SEPTUM SURGERY    40 yrs ago  PAROTIDECTOMY Left 01/26/2021  Procedure: LEFT PAROTIDECTOMY;  Surgeon: Melida Quitter, MD;  Location: [place];  Service: ENT;  Laterality: Left;  RADICAL NECK DISSECTION Left 01/26/2021  Procedure: MODIFIED RADICAL NECK DISSECTION;  Surgeon: Melida Quitter, MD;  Location: [facility] OR;  Service: ENT;  Laterality: Left; HPI: 88 year old male with past medical history of 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 Per MRI of head with multiple small foci of acute ischemia within both cerebral and  cerebellar hemispheres and Multiple  old small vessel infarcts and findings of chronic ischemic microangiopathy.  Subjective: alert, upright in chair for procedure  Recommendations for follow up therapy are one component of a multi-disciplinary discharge planning process, led by the attending physician.  Recommendations may be updated based on patient status, additional functional criteria and insurance authorization. Assessment / Plan / Recommendation   07/23/2021   2:00 PM Clinical Impressions Clinical Impression Pt presents with a mild oral dysphagia and moderate pharyngeal dysphagia likely of multifactorial etiology including prior head and neck cancer with XRT treatment, acute CVA, and pt deconditioning. Oral deficits included delayed bolus transit and mild oral residuals. Pharyngeal deficits c/b reduced  timing and efficiency of laryngeal vestibule closure, reduced base of tongue retraction, decreased laryngeal elevation, reduced pharyngeal stripping wave, and unreliable laryngeal sensation. This allowed for pre swallow laryngeal penetration with thin liquids to the level of the vocal cords with post swallow aspiration (PAS-8) milder amounts. Pt was able to clear aspirates with cueing to cough and volitionally swallow. Directed for use of chin tuck however pt could not consistenly implement. With thicker viscosities, pt with improved pre swallow airway protection, however post swallow residual spillage consistently occured with tracheal aspiration and increased pharyngeal residuals (nectar thick, honey thick, puree). Given that aspiration occured in smaller amounts across boluses (excluding small trial of mechanical soft), recommend dysphagia 3 (mechanical soft) thin liquids with strict adherence to safe swallow precautions. ( no straws, 1:1 supervision, cough/clear throat with all swallows, small sips and bites, swallow x2).  SLP to closely follow for tolerance, family and pt education. SLP Visit Diagnosis Dysphagia, oropharyngeal phase (R13.12) Impact on safety and function Moderate aspiration risk     07/23/2021   2:00 PM Treatment Recommendations Treatment Recommendations Therapy as outlined in treatment plan below     07/23/2021   2:00 PM Prognosis Prognosis for Safe Diet Advancement Good Barriers to Reach Goals Cognitive deficits;Time post onset   07/23/2021   2:00 PM Diet Recommendations SLP Diet Recommendations Thin liquid;Dysphagia 3 (Mech soft) solids Liquid Administration via Cup;No straw Medication Administration Crushed with puree Compensations Small sips/bites;Slow rate;Minimize environmental distractions;Clear throat intermittently;Hard cough after swallow;Effortful swallow;Multiple dry swallows after each bite/sip Postural Changes Seated upright at 90 degrees;Remain semi-upright after after feeds/meals  (Comment)     07/23/2021   2:00 PM Other Recommendations Oral Care Recommendations Oral care BID Follow Up Recommendations Acute inpatient rehab (3hours/day) Assistance recommended at discharge Frequent or constant Supervision/Assistance Functional Status Assessment Patient has had a recent decline in their functional status and demonstrates the ability to make significant improvements in function in a reasonable and predictable amount of time.   07/23/2021   2:00 PM Frequency and Duration  Speech Therapy Frequency (ACUTE ONLY) min 2x/week Treatment Duration 2 weeks     07/23/2021   2:00 PM Oral Phase Oral Phase Impaired Oral - Honey Cup Weak lingual manipulation;Delayed oral transit Oral - Nectar Cup Delayed oral transit;Lingual/palatal residue Oral - Thin Cup Lingual/palatal residue;Delayed oral transit Oral - Thin Straw Delayed oral transit Oral - Puree Delayed oral transit;Lingual/palatal residue Oral - Mech Soft Lingual/palatal residue;Decreased bolus cohesion    07/23/2021   2:00 PM Pharyngeal Phase Pharyngeal Phase Impaired Pharyngeal- Honey Cup Reduced laryngeal elevation;Reduced tongue base retraction;Penetration/Apiration after swallow;Pharyngeal residue - valleculae;Pharyngeal residue - pyriform;Reduced epiglottic inversion;Reduced pharyngeal peristalsis Pharyngeal Material does not enter airway;Material enters airway, passes BELOW cords without attempt by patient to eject out (silent aspiration) Pharyngeal- Nectar Cup Reduced epiglottic inversion;Reduced tongue base retraction;Pharyngeal residue - pyriform;Pharyngeal residue -  valleculae;Reduced pharyngeal peristalsis;Reduced laryngeal elevation;Penetration/Apiration after swallow Pharyngeal Material enters airway, CONTACTS cords and not ejected out;Material enters airway, passes BELOW cords without attempt by patient to eject out (silent aspiration) Pharyngeal- Thin Cup Penetration/Aspiration before swallow;Penetration/Apiration after  swallow;Penetration/Aspiration during swallow;Reduced epiglottic inversion;Reduced tongue base retraction;Reduced pharyngeal peristalsis;Reduced airway/laryngeal closure;Pharyngeal residue - valleculae;Pharyngeal residue - pyriform Pharyngeal Material enters airway, CONTACTS cords and not ejected out;Material enters airway, passes BELOW cords without attempt by patient to eject out (silent aspiration) Pharyngeal- Thin Straw Penetration/Aspiration before swallow;Penetration/Aspiration during swallow;Pharyngeal residue - pyriform;Pharyngeal residue - valleculae;Reduced epiglottic inversion;Reduced tongue base retraction;Reduced laryngeal elevation Pharyngeal Material enters airway, CONTACTS cords and not ejected out Pharyngeal- Puree Delayed swallow initiation-vallecula;Penetration/Apiration after swallow;Pharyngeal residue - valleculae;Pharyngeal residue - pyriform;Reduced epiglottic inversion;Reduced tongue base retraction;Reduced laryngeal elevation Pharyngeal Material enters airway, remains ABOVE vocal cords and not ejected out Pharyngeal- Mechanical Soft Pharyngeal residue - pyriform;Pharyngeal residue - valleculae;Reduced pharyngeal peristalsis;Reduced laryngeal elevation    07/23/2021   2:00 PM Cervical Esophageal Phase  Cervical Esophageal Phase [facility] Chelsea E Hartness MA, CCC-SLP 07/23/2021, 2:41 PM                          Scheduled Meds:  amiodarone  200 mg Oral BID   Followed by   Derrill Memo ON 07/31/2021] amiodarone  200 mg Oral Daily   brimonidine  1 drop Both Eyes BID   dabigatran  150 mg Oral Q12H   dorzolamide-timolol  1 drop Both Eyes BID   latanoprost  1 drop Both Eyes QHS   melatonin  5 mg Oral QHS   QUEtiapine  25 mg Oral QHS   rosuvastatin  10 mg Oral Daily   Continuous Infusions:     LOS: 3 days       Phillips Climes, MD [facility]   To contact the attending provider between 7A-7P or the covering provider during after hours 7P-7A, please log into the web site  [email or web address] and access using universal [place] password for that web site. If you do not have the password, please call the hospital operator.  07/25/2021, 1:15 PM

## 2021-07-25 NOTE — Progress Notes (Signed)
Inpatient Rehab Admissions Coordinator:  Spoke with pt's son Nicki Reaper. He acknowledged understanding of CIR goals and expectations. He would like pt to pursue CIR. He confirmed that family and friends will be able to provide 24/7 support for pt after discharge. Will continue to follow.   Gayland Curry, [place], [place] Admissions Coordinator [phone]

## 2021-07-25 NOTE — Progress Notes (Addendum)
Received a call from bedside RN regarding the patient having a change in mental status with NIH score of 10 from 5.  Alert but more confused and not answering questions appropriately.  Presented at bedside.  His son was present in the room.  The patient was awake, eyes open.  Per his son, the patient was feeding himself yesterday morning, during breakfast and lunch but was unable to feed himself supper.  Also had a change in his personality with a tendency for anger and possibly frustration.  The patient is on Pradaxa for hx of p. Afib.  CT head with no contrast was ordered and results were non acute.    Per bedside RN, the patient has not slept for days.  Suspect delirium from acute illness, likely exacerbated by recent stroke and lack of sleep.  Added delirium precautions.  Recommend delirium management during the day with frequent re-orientation.  Open blinds during the day, OOB to chair with assistance during the day.  Melatonin or low dose Seroquel 12.5 mg at night to regulate sleep and wake cycle.  Avoid medications that can exacerbate delirium.

## 2021-07-25 NOTE — Progress Notes (Signed)
Patient agitated stating he is wanting to go home.  Patient pulling at telemetry wires and male purwik.  Patient refuses to leave oxygen on.  O2 sat 95% RA.  Family refuses mittens stating he didn't like them.  Attempted EKG, but patient pulling off leads immediately.  Haldol '1mg'$  IV given.  Will continue to monitor.

## 2021-07-25 NOTE — PMR Pre-admission (Incomplete)
PMR Admission Coordinator Pre-Admission Assessment  Patient: Donald Hawkins is an 88 y.o., male MRN: [id number] DOB: [birth year] Height: '5\' 10"'$  (177.8 cm) Weight: 62.5 kg  Insurance Information HMO: ***    PPO: ***     PCP:      IPA:      80/20:      OTHER:  PRIMARY: UHC Medicare      Policy#: [id number]      Subscriber: patient CM Name: ***      Phone#: ***     Fax#: *** Pre-Cert#: ***      Employer: *** Benefits:  Phone #: ***     Name: *** Irene Shipper. Date: ***     Deduct: ***      Out of Pocket Max: ***      Life Max: *** CIR: ***      SNF: *** Outpatient: ***     Co-Pay: *** Home Health: ***      Co-Pay: *** DME: ***     Co-Pay: *** Providers: in-network SECONDARY:       Policy#:      Phone#:   Financial Counselor:       Phone#:   The Engineer, petroleum" for patients in Inpatient Rehabilitation Facilities with attached "Privacy Act [place] Records" was provided and verbally reviewed with: {CHL IP Patient Family YB:[id number]}  Emergency Contact Information Contact Information     Name Relation Home Work Mobile   [place] Son [phone]  [phone]       Current Medical History  Patient Admitting Diagnosis: CVA History of Present Illness: Pt is a 88 year old male with medical hx significant for: 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 Pt presented to [facility] on 07/22/21 d/t right-sided facial droop and slurred speech. Pt not a tPA candidate. Code stroke activated in ED after worsening right-sided facial droop with slurred speech. Pt taken for emergent imaging. CT head showed no acute changes. CTA head and neck showed no LVO.  CT perfusion study showed small ischemic penumbra in left frontal operculum. MRI showed multiple small foci of acute ischemia within both cerebral and cerebellar hemispheres. Therapy  evaluations completed and CIR recommended d/t pt's deficits in functional mobility, cognitive-linguistic deficits, and inability to complete ADLs independently. *** Complete NIHSS TOTAL: 13  Patient's [facility] has been reviewed by the rehabilitation admission coordinator and physician.  Past Medical History  Past Medical History:  Diagnosis Date   Cancer ([place])    skin top of head - squamous and basal. "Squamous on left neck"   Depression    Hernia, inguinal    Hypertension     Has the patient had major surgery during 100 days prior to admission? No  Family History   family history includes Cancer in his father; Emphysema in his mother.  Current Medications  Current Facility-Administered Medications:    acetaminophen (TYLENOL) tablet 650 mg, 650 mg, Oral, Q6H PRN **OR** acetaminophen (TYLENOL) suppository 650 mg, 650 mg, Rectal, Q6H PRN, Shalhoub, Sherryll Burger, MD   brimonidine (ALPHAGAN) 0.2 % ophthalmic solution 1 drop, 1 drop, Both Eyes, BID, Shalhoub, Sherryll Burger, MD, 1 drop at 07/25/21 0929   dabigatran (PRADAXA) capsule 150 mg, 150 mg, Oral, Q12H, Pierce, Dwayne A, RPH, 150 mg at 07/24/21 2139   dorzolamide-timolol (COSOPT) 22.3-6.8 MG/ML ophthalmic solution 1 drop, 1 drop, Both Eyes, BID, Shalhoub,  Sherryll Burger, MD, 1 drop at 07/25/21 3086   hydrALAZINE (APRESOLINE) injection 10 mg, 10 mg, Intravenous, Q6H PRN, Shalhoub, Sherryll Burger, MD   latanoprost (XALATAN) 0.005 % ophthalmic solution 1 drop, 1 drop, Both Eyes, QHS, Shalhoub, Sherryll Burger, MD, 1 drop at 07/24/21 2140   melatonin tablet 5 mg, 5 mg, Oral, QHS, Elgergawy, Silver Huguenin, MD, 5 mg at 07/24/21 2139   ondansetron (ZOFRAN) tablet 4 mg, 4 mg, Oral, Q6H PRN **OR** ondansetron (ZOFRAN) injection 4 mg, 4 mg, Intravenous, Q6H PRN, Shalhoub, Sherryll Burger, MD   polyethylene glycol (MIRALAX / GLYCOLAX) packet 17 g, 17 g, Oral, Daily PRN, Shalhoub, Sherryll Burger, MD   QUEtiapine (SEROQUEL) tablet 25 mg, 25 mg, Oral, QHS,  Elgergawy, Silver Huguenin, MD   rosuvastatin (CRESTOR) tablet 10 mg, 10 mg, Oral, Daily, Rosalin Hawking, MD, 10 mg at 07/24/21 0948   traZODone (DESYREL) tablet 100 mg, 100 mg, Oral, QHS PRN, Elgergawy, Silver Huguenin, MD  Patients Current Diet:  Diet Order             DIET DYS 3 Room service appropriate? Yes with Assist; Fluid consistency: Thin  Diet effective now                   Precautions / Restrictions Precautions Precautions: Fall Restrictions Weight Bearing Restrictions: No   Has the patient had 2 or more falls or a fall with injury in the past year? No  Prior Activity Level Limited Community (1-2x/wk): doctors' appointments  Prior Functional Level Self Care: Did the patient need help bathing, dressing, using the toilet or eating? Independent  Indoor Mobility: Did the patient need assistance with walking from room to room (with or without device)? Independent  Stairs: Did the patient need assistance with internal or external stairs (with or without device)? Needed some help  Functional Cognition: Did the patient need help planning regular tasks such as shopping or remembering to take medications? Needed some help  Patient Information    Patient's Response To:     Home Assistive Devices / Equipment Home Assistive Devices/Equipment: Wheelchair, Environmental consultant (specify type), Kasandra Knudsen (specify quad or straight) Home Equipment: Wheelchair - manual, Cane - single point, [employer], BSC/3in1, Grab bars - tub/shower  Prior Device Use: Indicate devices/aids used by the patient prior to current illness, exacerbation or injury? Walker and cane  Current Functional Level Cognition  Overall Cognitive Status: Impaired/Different from baseline Difficult to assess due to: Hard of hearing/deaf Orientation Level: Disoriented X4 Following Commands: Follows one step commands with increased time Safety/Judgement: Decreased awareness of safety, Decreased awareness of deficits General Comments: pt  unaware when he began to drift R and flex knees in standing and need to sit with short distance ambulation Attention: Focused, Sustained Focused Attention: Impaired Focused Attention Impairment: Verbal complex, Functional complex Sustained Attention: Impaired Sustained Attention Impairment: Verbal complex, Functional complex Memory: Impaired Memory Impairment: Decreased recall of new information, Decreased short term memory Decreased Short Term Memory: Verbal complex, Functional complex Awareness: Impaired Problem Solving: Impaired Problem Solving Impairment: Verbal complex, Functional complex Executive Function: Organizing, Decision Making, Self Monitoring Organizing: Impaired Organizing Impairment: Verbal complex, Functional complex Decision Making: Impaired Decision Making Impairment: Verbal complex, Functional complex Self Monitoring: Impaired Self Monitoring Impairment: Verbal complex, Functional complex Safety/Judgment: Impaired    Extremity Assessment (includes Sensation/Coordination)  Upper Extremity Assessment: Defer to OT evaluation RUE Deficits / Details: +drift with vision occluded RUE Sensation: decreased proprioception  Lower Extremity Assessment: RLE deficits/detail RLE Deficits / Details: grossly 3/5  RLE Sensation: decreased proprioception RLE Coordination: decreased fine motor    ADLs  Overall ADL's : Needs assistance/impaired Eating/Feeding: NPO Grooming: Wash/dry hands, Standing, Moderate assistance Grooming Details (indicate cue type and reason): mod assist for standing Upper Body Bathing: Sitting, Moderate assistance Lower Body Bathing: Moderate assistance, Sit to/from stand Upper Body Dressing : Sitting, Minimal assistance Lower Body Dressing: Moderate assistance, Sitting/lateral leans Lower Body Dressing Details (indicate cue type and reason): LOB with donning socks Toilet Transfer: Moderate assistance, +2 for safety/equipment, Ambulation, Rolling  walker (2 wheels) Toileting- Clothing Manipulation and Hygiene: Moderate assistance, Sit to/from stand Functional mobility during ADLs: Moderate assistance, +2 for safety/equipment, Rolling walker (2 wheels)    Mobility  Overal bed mobility: Needs Assistance Bed Mobility: Supine to Sit Supine to sit: Min assist, HOB elevated General bed mobility comments: +rail, cues for sequencing, assist to elevate trunk    Transfers  Overall transfer level: Needs assistance Equipment used: Rolling walker (2 wheels) Transfers: Sit to/from Stand Sit to Stand: Mod assist, +2 safety/equipment General transfer comment: cues for hand placement/sequencing, assist to power up and stabilize balance, increased time    Ambulation / Gait / Stairs / Wheelchair Mobility  Ambulation/Gait Ambulation/Gait assistance: Mod assist, +2 safety/equipment Gait Distance (Feet): 10 Feet Assistive device: Rolling walker (2 wheels) Gait Pattern/deviations: Step-to pattern, Step-through pattern, Decreased stride length, Decreased weight shift to right, Decreased step length - right, Shuffle General Gait Details: assist for RW management and balance, continual cues for posture, assist with L lateral weight shift and progression of RLE through swing phase Gait velocity: very slow Gait velocity interpretation: <1.8 ft/sec, indicate of risk for recurrent falls    Posture / Balance Dynamic Sitting Balance Sitting balance - Comments: LOB when donning L sock Balance Overall balance assessment: Needs assistance Sitting-balance support: Feet supported, Bilateral upper extremity supported Sitting balance-Leahy Scale: Fair Sitting balance - Comments: LOB when donning L sock Standing balance support: Bilateral upper extremity supported, During functional activity, Reliant on assistive device for balance Standing balance-Leahy Scale: Poor Standing balance comment: requires moderate assistance and at least one hand support in static  standing at sink.  Pt loses upright posture and flexes right LE seemingly unaware.    Special needs/care consideration Skin Ecchymosis: arm/bilateral; Bladder incontinence; External urinary catheter   Previous Home Environment (from acute therapy documentation) Living Arrangements: Alone  Lives With: Alone Available Help at Discharge: Family, Friend(s), Available 24 hours/day Type of Home: House Home Layout: One level Home Access: Stairs to enter Entrance Stairs-Rails: Right, Left Entrance Stairs-Number of Steps: 4-5 steps in back, 5 steps in front Bathroom Shower/Tub: Tub/shower unit, Sponge bathes at baseline Bathroom Toilet: Standard Bathroom Accessibility:  (son is unsure. Pt typically leaves walker outside bathroom) Home Care Services: No  Discharge Living Setting Plans for Discharge Living Setting: Patient's home Type of Home at Discharge: House Discharge Home Layout: One level Discharge Home Access: Stairs to enter Entrance Stairs-Rails: Right, Left Entrance Stairs-Number of Steps: 4-5 steps in back, 5 stps in front Discharge Bathroom Shower/Tub: Tub/shower unit, Other (comment) (pt takes sponge baths) Discharge Bathroom Toilet: Standard Discharge Bathroom Accessibility:  (son is unsure. Pt typically leaves walker outside of bathroom) Does the patient have any problems obtaining your medications?: No  Social/Family/Support Systems Anticipated Caregiver: [place] Knee, son, other family and friends Anticipated Caregiver's Contact Information: [phone] Caregiver Availability: 24/7 Discharge Plan Discussed with Primary Caregiver: Yes Is Caregiver In Agreement with Plan?: Yes Does Caregiver/Family have Issues with Lodging/Transportation while Pt is in Rehab?:  No  Goals Patient/Family Goal for Rehab: *** Expected length of stay: *** Pt/Family Agrees to Admission and willing to participate: Yes Program Orientation Provided & Reviewed with Pt/Caregiver Including Roles  &  Responsibilities: Yes  Decrease burden of Care through IP rehab admission: NA  Possible need for SNF placement upon discharge: Not anticipated  Patient Condition: {PATIENT'S CONDITION:22832}  Preadmission Screen Completed By:  Bethel Born, 07/25/2021 4:40 PM ______________________________________________________________________   Discussed status with Dr. Marland Kitchen on *** at *** and received approval for admission today.  Admission Coordinator:  Bethel Born, CCC-SLP, time ***/Date ***   Assessment/Plan: Diagnosis: Does the need for close, 24 hr/day Medical supervision in concert with the patient's rehab needs make it unreasonable for this patient to be served in a less intensive setting? {yes_no_potentially:3041433} Co-Morbidities requiring supervision/potential complications: *** Due to {due FS:2395320}, does the patient require 24 hr/day rehab nursing? {yes_no_potentially:3041433} Does the patient require coordinated care of a physician, rehab nurse, PT, OT, and SLP to address physical and functional deficits in the context of the above medical diagnosis(es)? {yes_no_potentially:3041433} Addressing deficits in the following areas: {deficits:3041436} Can the patient actively participate in an intensive therapy program of at least 3 hrs of therapy 5 days a week? {yes_no_potentially:3041433} The potential for patient to make measurable gains while on inpatient rehab is {potential:3041437} Anticipated functional outcomes upon discharge from inpatient rehab: {functional outcomes:304600100} PT, {functional outcomes:304600100} OT, {functional outcomes:304600100} SLP Estimated rehab length of stay to reach the above functional goals is: *** Anticipated discharge destination: {anticipated dc setting:21604} 10. Overall Rehab/Functional Prognosis: {potential:3041437}   MD Signature: ***

## 2021-07-26 DIAGNOSIS — R410 Disorientation, unspecified: Secondary | ICD-10-CM | POA: Diagnosis not present

## 2021-07-26 DIAGNOSIS — I639 Cerebral infarction, unspecified: Secondary | ICD-10-CM | POA: Diagnosis not present

## 2021-07-26 MED ORDER — QUETIAPINE FUMARATE 50 MG PO TABS
50.0000 mg | ORAL_TABLET | ORAL | Status: DC
  Administered 2021-07-26 – 2021-07-28 (×3): 50 mg via ORAL
  Filled 2021-07-26 (×3): qty 1

## 2021-07-26 MED ORDER — QUETIAPINE FUMARATE 50 MG PO TABS: 50.0000 mg | ORAL_TABLET | Freq: Every day | ORAL | Status: DC

## 2021-07-26 NOTE — Progress Notes (Signed)
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  Work-up significant for cardioembolic acute CVA, he was admitted for further management.     Assessment & Plan:   Principal Problem:   Acute cardioembolic stroke [facility]) Active Problems:   Paroxysmal atrial fibrillation (HCC)   Essential hypertension   Metastatic squamous cell carcinoma to head and neck (HCC)   Long term current use of anticoagulant therapy   Glaucoma   Acute embolic stroke ([place])   Protein-calorie malnutrition, severe    Acute cardioembolic stroke ([place]) -MRI brain significant for multiple small foci of acute ischemia within both cerebral and cerebellar hemispheres, pattern suggestive of emboli from cardiac or proximal aortic source. -Patient on Eliquis, reports compliance, so this would be considered Eliquis failure, transitioned to Pradaxa as discussed with neurology. -Remains n.p.o. after SLP evaluation, plan for MBSS today, if fails likely will need core track. -Follow on 2D echo. -LDL is 74, started on low-dose statin - A1c is 5.5 last April -Seen by PT/OT, recommendations for CIR.    Paroxysmal atrial fibrillation (HCC) -Rate uncontrolled, improved with low-dose amiodarone, avoiding other regimens given stable BP. -See above discussion regarding Eliquis versus Pradaxa   Hospital delirium -Patient remains to be having significant  issues with hospital delirium, mainly at nighttime, he required IV haloperidol yesterday, he was started on Seroquel low-dose, will go and increase his dose today.  We will give it earlier at 8 PM this evening. -Continue with delirium precaution   Essential hypertension - Permissive hypertension - PRN IV Hydralazine for SBP > 220 or DBP > 115   Metastatic squamous cell carcinoma to head and neck (HCC) Diagnosed 2022 status post left superficial parotidectomy and a modified radical neck dissection completed on January 26, 2021 S/P radiation therapy with Dr. Isidore Moos Continue outpatient follow up with Dr. Alen Blew   Glaucoma Home regimen of eye drops resumed.   Long term current use of anticoagulant therapy On Chronic Eliquis for atrial fibrillation, CHADSVASC score of 3.  Holding for now per Neurology recommendations     DVT prophylaxis: Eliquis>> pradaxa Code Status: Full Family Communication: Son and daughter in law at bedside Disposition:   Status is: Inpatient Remains inpatient appropriate because: Patient can be discharged to CIR when bed is available.   Consultants:  Neurology   Subjective:  Patient with another episode of delirium/agitation yesterday for which he required IV haloperidol.  Objective: Vitals:   07/25/21 1923 07/25/21 2304 07/26/21 0303 07/26/21 0800  BP: (!) 142/84 135/74 (!) 146/78   Pulse: 71 84 81 76  Resp: '18 20 17   '$ Temp: 97.8 F (36.6 C) 97.8 F (36.6 C) 97.8 F (36.6 C) 97.9 F (36.6 C)  TempSrc: Axillary Axillary Axillary Axillary  SpO2: 100% 92% 96% 98%  Weight:      Height:        Intake/Output Summary (Last 24 hours) at 07/26/2021 1253 Last data filed at 07/26/2021 0304 Gross per 24 hour  Intake 419.17 ml  Output 1200 ml  Net -780.83 ml   Filed Weights   07/23/21 0100  Weight: 62.5 kg    Examination:  He is more awake, conversant this afternoon, he remains with significant dysarthria.   Symmetrical Chest wall movement,  Good air movement bilaterally, CTAB RRR,No Gallops,Rubs or new Murmurs, No Parasternal Heave +ve B.Sounds, Abd Soft, No tenderness, No rebound - guarding or rigidity. No Cyanosis, Clubbing or edema, No new Rash or bruise        Data Reviewed: I have personally reviewed following labs and imaging studies  CBC: Recent Labs  Lab 07/22/21 1632 07/22/21 1931 07/23/21 0406  WBC 6.4  --  6.3  NEUTROABS  --   --  3.7  HGB 13.9 13.9 13.6  HCT 42.3 41.0 38.7*  MCV 98.8  --  93.0  PLT 155  --  136*    Basic Metabolic Panel: Recent Labs  Lab 07/22/21 1632 07/22/21 1931 07/23/21 0406  NA 136 137 138  K 4.0 4.0 3.6  CL 105 106 110  CO2 21*  --  24  GLUCOSE 120* 104* 93  BUN '10 11 8  '$ CREATININE 0.82 0.80 0.86  CALCIUM 9.3  --  9.0  MG  --   --  1.9    GFR: Estimated Creatinine Clearance: 49.5 mL/min (by C-G formula based on SCr of 0.86 mg/dL).  Liver Function Tests: Recent Labs  Lab 07/22/21 1632 07/23/21 0406  AST 25 21  ALT 12 11  ALKPHOS 82 70  BILITOT 0.8 1.0  PROT 7.0 6.3*  ALBUMIN 3.4* 3.0*    CBG: Recent Labs  Lab 07/22/21 1629  GLUCAP 120*     Recent Results (from the past 240 hour(s))  Resp Panel by RT-PCR (Flu A&B, Covid) Nasopharyngeal Swab     Status: None   Collection Time: 07/22/21  9:08 PM   Specimen: Nasopharyngeal Swab; Nasopharyngeal(NP) swabs in vial transport medium  Result Value Ref Range Status   SARS Coronavirus 2 by RT PCR NEGATIVE NEGATIVE Final    Comment: (NOTE) SARS-CoV-2 target nucleic acids are NOT DETECTED.  The SARS-CoV-2 RNA is generally detectable in upper respiratory specimens during the acute phase of infection. The lowest concentration of SARS-CoV-2 viral copies this assay can detect is 138 copies/mL. A negative result does not preclude SARS-Cov-2 infection and should not be used as the sole basis for treatment or other patient management decisions. A negative result may occur with  improper specimen  collection/handling, submission of specimen other than nasopharyngeal swab, presence of viral mutation(s) within the areas targeted by this assay, and inadequate number of viral copies(<138 copies/mL). A negative result must be combined with clinical observations, patient history, and epidemiological information. The expected result is Negative.  Fact Sheet for Patients:  EntrepreneurPulse.com.au  Fact Sheet for Healthcare Providers:  IncredibleEmployment.be  This test is no t yet approved or cleared by the [place] FDA and  has been authorized for detection and/or diagnosis of SARS-CoV-2 by FDA under an Emergency Use Authorization (EUA). This EUA will remain  in effect (meaning this test can be used) for the duration of the COVID-19 declaration under Section 564(b)(1) of the Act, 21 U.S.C.section 360bbb-3(b)(1), unless the authorization is terminated  or revoked sooner.       Influenza A by PCR NEGATIVE NEGATIVE Final   Influenza B by PCR NEGATIVE NEGATIVE Final    Comment: (NOTE) The Xpert Xpress SARS-CoV-2/FLU/RSV plus assay is intended as an aid in the diagnosis of influenza from  Nasopharyngeal swab specimens and should not be used as a sole basis for treatment. Nasal washings and aspirates are unacceptable for Xpert Xpress SARS-CoV-2/FLU/RSV testing.  Fact Sheet for Patients: EntrepreneurPulse.com.au  Fact Sheet for Healthcare Providers: IncredibleEmployment.be  This test is not yet approved or cleared by the [place] FDA and has been authorized for detection and/or diagnosis of SARS-CoV-2 by FDA under an Emergency Use Authorization (EUA). This EUA will remain in effect (meaning this test can be used) for the duration of the COVID-19 declaration under Section 564(b)(1) of the Act, 21 U.S.C. section 360bbb-3(b)(1), unless the authorization is terminated or revoked.  Performed at [facility], [place] [address], [place], [place] [address]          Radiology Studies: CT HEAD WO CONTRAST (5MM)  Result Date: 07/25/2021 CLINICAL DATA:  Stroke follow-up EXAM: CT HEAD WITHOUT CONTRAST TECHNIQUE: Contiguous axial images were obtained from the base of the skull through the vertex without intravenous contrast. RADIATION DOSE REDUCTION: This exam was performed according to the departmental dose-optimization program which includes automated exposure control, adjustment of the mA and/or kV according to patient size and/or use of iterative reconstruction technique. COMPARISON:  None Available. FINDINGS: Brain: There is no mass, hemorrhage or extra-axial collection. There is generalized atrophy without lobar predilection. Hypodensity of the white matter is most commonly associated with chronic microvascular disease. Vascular: No abnormal hyperdensity of the major intracranial arteries or dural venous sinuses. No intracranial atherosclerosis. Skull: The visualized skull base, calvarium and extracranial soft tissues are normal. Sinuses/Orbits: Left mastoid effusion.  The orbits are normal. IMPRESSION: 1. No acute intracranial abnormality. 2. Left mastoid effusion. 3. Hypodensity of the white matter is most commonly associated with chronic microvascular disease. Electronically Signed   By: Ulyses Jarred M.D.   On: 07/25/2021 03:03        Scheduled Meds:  brimonidine  1 drop Both Eyes BID   dabigatran  150 mg Oral Q12H   dorzolamide-timolol  1 drop Both Eyes BID   latanoprost  1 drop Both Eyes QHS   melatonin  5 mg Oral QHS   QUEtiapine  50 mg Oral QHS   rosuvastatin  10 mg Oral Daily   Continuous Infusions:  sodium chloride 50 mL/hr at 07/25/21 1841      LOS: 4 days       Phillips Climes, MD [facility]   To contact the attending provider between 7A-7P or the covering provider during after hours 7P-7A, please log into the web site [email or web address] and access using  universal [place] password for that web site. If you do not have the password, please call the hospital operator.  07/26/2021, 12:53 PM

## 2021-07-26 NOTE — Plan of Care (Signed)
  Problem: Education: Goal: Knowledge of disease or condition will improve Outcome: Progressing   Problem: Self-Care: Goal: Ability to participate in self-care as condition permits will improve Outcome: Progressing   Problem: Self-Care: Goal: Verbalization of feelings and concerns over difficulty with self-care will improve Outcome: Progressing   Problem: Self-Care: Goal: Ability to communicate needs accurately will improve Outcome: Progressing   Problem: Nutrition: Goal: Risk of aspiration will decrease Outcome: Progressing

## 2021-07-27 ENCOUNTER — Other Ambulatory Visit (HOSPITAL_COMMUNITY): Payer: Self-pay

## 2021-07-27 DIAGNOSIS — I639 Cerebral infarction, unspecified: Secondary | ICD-10-CM | POA: Diagnosis not present

## 2021-07-27 DIAGNOSIS — R1310 Dysphagia, unspecified: Secondary | ICD-10-CM

## 2021-07-27 LAB — BASIC METABOLIC PANEL
Anion gap: 4 — ABNORMAL LOW (ref 5–15)
BUN: 13 mg/dL (ref 8–23)
CO2: 23 mmol/L (ref 22–32)
Calcium: 8.4 mg/dL — ABNORMAL LOW (ref 8.9–10.3)
Chloride: 110 mmol/L (ref 98–111)
Creatinine, Ser: 0.93 mg/dL (ref 0.61–1.24)
GFR, Estimated: >60 mL/min (ref >60)
Glucose, Bld: 136 mg/dL — ABNORMAL HIGH (ref 70–99)
Potassium: 3.7 mmol/L (ref 3.5–5.1)
Sodium: 137 mmol/L (ref 135–145)

## 2021-07-27 LAB — CBC
HCT: 36.3 % — ABNORMAL LOW (ref 39.0–52.0)
Hemoglobin: 12.9 g/dL — ABNORMAL LOW (ref 13.0–17.0)
MCH: 33.4 pg (ref 26.0–34.0)
MCHC: 35.5 g/dL (ref 30.0–36.0)
MCV: 94 fL (ref 80.0–100.0)
Platelets: 129 10*3/uL — ABNORMAL LOW (ref 150–400)
RBC: 3.86 MIL/uL — ABNORMAL LOW (ref 4.22–5.81)
RDW: 13.1 % (ref 11.5–15.5)
WBC: 9.3 10*3/uL (ref 4.0–10.5)
nRBC: 0 % (ref 0.0–0.2)

## 2021-07-27 MED ORDER — TRAZODONE HCL 50 MG PO TABS: 50.0000 mg | ORAL_TABLET | Freq: Every evening | ORAL | Status: DC | PRN

## 2021-07-27 MED ORDER — SODIUM CHLORIDE 0.9 % IV BOLUS
250.0000 mL | INTRAVENOUS | Status: AC
  Administered 2021-07-27: 250 mL via INTRAVENOUS

## 2021-07-27 NOTE — Progress Notes (Signed)
Speech Language Pathology Treatment: Dysphagia;Cognitive-Linquistic  Patient Details Name: Donald Hawkins MRN: [id number] DOB: [birth year] Today's Date: 07/27/2021 Time: 1335-1400 SLP Time Calculation (min) (ACUTE ONLY): 25 min  Assessment / Plan / Recommendation Clinical Impression  Pt seen for dysphagia/speech tx d/t ongoing dysphagia/dysarthria.  Pt provided min verbal/visual cues (partially d/t hearing loss on L post-radiation tx for head/neck CA) with pt able to repeat utilizing teach back and implement strategies including throat clear/re-swallow after each swallow.  Various consistencies of thin via 1/2 tsp amounts, puree and Dysphagia 3 consistency administered with good oral clearance with manipulation on R encouraged and eventually implemented independently by pt.  Delayed throat clear/cough noted with puree/solids likely d/t pharyngeal residue (as noted on MBS on 07/23/21) with swallowing strategies decreasing this occurrence, but unable to completely eliminate overt s/s of aspiration during trial.  Pt is at mild risk for aspiration with swallowing strategies in place and severe aspiration risk without strategies in place.  FULL supervision recommended during meals for implementation of swallowing strategies.  Continue Dysphagia 3 (minced meats/gravy)/thin liquids via 1/2 tsp amounts with repetitive swallow and throat clearing/cough after EACH small bite/sip.  IF symptoms persist, may consider NPO status and initiate non-oral feeding and/or comfort measures with pt preferred foods for easier oral/pharyngeal manipulation/transition.   Dysarthria tx completed with education provided to pt/caregiver for increasing overall breath support for improved precise articulation, elevating vocal intensity and increasing overall speech intelligibility in words-sentences.  Pt given strategies for slow, exaggerated speech with pausing during speaking tasks with education provided to pt/caregiver with improved  speech intelligibility to 80% accuracy within words-sentences.  ST will continue to f/u in acute setting.      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 Per MRI of head with multiple small foci of acute ischemia within both cerebral and  cerebellar hemispheres and Multiple old small vessel infarcts and findings of chronic ischemic microangiopathy; MBS completed with mild oral dysphagia and moderate pharyngeal dysphagia; placed on D3/thin liquid diet with strict swallowing precautions in place.  ST f/u for speech/language and dysphagia therapy.      SLP Plan  Continue with current plan of care      Recommendations for follow up therapy are one component of a multi-disciplinary discharge planning process, led by the attending physician.  Recommendations may be updated based on patient status, additional functional criteria and insurance authorization.    Recommendations  Diet recommendations: Dysphagia 3 (mechanical soft);Thin liquid;Other(comment) (minced meats w/ gravy) Liquids provided via: Teaspoon Medication Administration: Crushed with puree Supervision: Full supervision/cueing for compensatory strategies;Staff to assist with self feeding Compensations: Small sips/bites;Slow rate;Multiple dry swallows after each bite/sip;Effortful swallow;Clear throat after each swallow;Other (Comment) (cough or clear throat after each swallow) Postural Changes and/or Swallow Maneuvers: Seated upright 90 degrees                Oral Care Recommendations: Oral care BID;Staff/trained caregiver to  provide oral care Follow Up Recommendations: Acute inpatient rehab (3hours/day) Assistance recommended at discharge: Frequent or constant Supervision/Assistance SLP Visit Diagnosis: Dysphagia, oropharyngeal phase (R13.12) Plan: Continue with current plan of care           [facility]  Andree Elk, M.S., [place]  07/27/2021, 2:46 PM

## 2021-07-27 NOTE — NC FL2 (Signed)
[place] LEVEL OF CARE SCREENING TOOL     IDENTIFICATION  Patient Name: Donald Hawkins Birthdate: 09/09/1928 Sex: male Admission Date (Current Location): 07/22/2021  [facility] and [place] Number:  Herbalist and Address:  The [place]. [facility], [place] [address] [place], [place] [address]      Provider Number: [id number]  Attending Physician Name and Address:  Elgergawy, Silver Huguenin, MD  Relative Name and Phone Number:       Current Level of Care: Hospital Recommended Level of Care: [place] Prior Approval Number:    Date Approved/Denied:   PASRR Number: [id number]  Discharge Plan: SNF    Current Diagnoses: Patient Active Problem List   Diagnosis Date Noted   Protein-calorie malnutrition, severe 07/23/2021   Acute cardioembolic stroke ([place]) 78/24/2353   Essential hypertension 07/22/2021   Glaucoma 61/44/3154   Acute embolic stroke ([place]) 00/86/7619   Neoplasm of uncertain behavior of skin 06/10/2021   Imbalance 06/08/2021   Left ear hearing loss 06/08/2021   Encounter for preoperative dental examination 03/20/2021   Long term current use of anticoagulant therapy 03/20/2021   Teeth missing 03/20/2021   Accretions on teeth 03/20/2021   Defective dental restoration 03/20/2021   Gingival recession, generalized 03/20/2021   Clinical xerostomia 03/20/2021   Epistaxis 03/12/2021   Secondary malignant neoplasm of cervical lymph node ([place]) 02/25/2021   Metastatic squamous cell carcinoma to head and neck ([place]) 01/26/2021   Paroxysmal atrial fibrillation ([place]) 01/22/2021   Secondary hypercoagulable state ([place]) 01/22/2021   Primary squamous cell carcinoma of left ear 01/14/2021    Orientation RESPIRATION BLADDER Height & Weight     Self  Normal Incontinent, External catheter Weight: 137 lb 12.6 oz (62.5 kg) Height:  '5\' 10"'$  (177.8 cm)  BEHAVIORAL SYMPTOMS/MOOD NEUROLOGICAL BOWEL NUTRITION STATUS      Continent Diet (See DC  Summary)  AMBULATORY STATUS COMMUNICATION OF NEEDS Skin   Extensive Assist Verbally Normal                       Personal Care Assistance Level of Assistance  Bathing, Feeding, Dressing Bathing Assistance: Maximum assistance Feeding assistance: Limited assistance Dressing Assistance: Limited assistance     Functional Limitations Info  Sight Sight Info: Impaired        SPECIAL CARE FACTORS FREQUENCY  PT (By licensed PT), OT (By licensed OT), Speech therapy     PT Frequency: 5x/week OT Frequency: 5x/week     Speech Therapy Frequency: 2x/week      Contractures Contractures Info: Not present    Additional Factors Info  Code Status, Allergies, Psychotropic Code Status Info: Full Allergies Info: NKA Psychotropic Info: Seroquel         Current Medications (07/27/2021):  This is the current hospital active medication list Current Facility-Administered Medications  Medication Dose Route Frequency Provider Last Rate Last Admin   0.9 %  sodium chloride infusion   Intravenous Continuous Elgergawy, Silver Huguenin, MD 50 mL/hr at 07/25/21 1841 New Bag at 07/25/21 1841   acetaminophen (TYLENOL) tablet 650 mg  650 mg Oral Q6H PRN Vernelle Emerald, MD   650 mg at 07/26/21 2026   Or   acetaminophen (TYLENOL) suppository 650 mg  650 mg Rectal Q6H PRN Vernelle Emerald, MD       brimonidine (ALPHAGAN) 0.2 % ophthalmic solution 1 drop  1 drop Both Eyes BID Shalhoub, Sherryll Burger, MD   1 drop at 07/27/21 [phone]  dabigatran (PRADAXA) capsule 150 mg  150 mg Oral Q12H Pierce, Dwayne A, RPH   150 mg at 07/27/21 0916   dorzolamide-timolol (COSOPT) 22.3-6.8 MG/ML ophthalmic solution 1 drop  1 drop Both Eyes BID Vernelle Emerald, MD   1 drop at 07/27/21 0917   hydrALAZINE (APRESOLINE) injection 10 mg  10 mg Intravenous Q6H PRN Shalhoub, Sherryll Burger, MD       latanoprost (XALATAN) 0.005 % ophthalmic solution 1 drop  1 drop Both Eyes QHS Shalhoub, Sherryll Burger, MD   1 drop at 07/26/21 2028   melatonin  tablet 5 mg  5 mg Oral QHS Elgergawy, Silver Huguenin, MD   5 mg at 07/26/21 2027   ondansetron (ZOFRAN) tablet 4 mg  4 mg Oral Q6H PRN Vernelle Emerald, MD       Or   ondansetron [facility]) injection 4 mg  4 mg Intravenous Q6H PRN Shalhoub, Sherryll Burger, MD       polyethylene glycol (MIRALAX / GLYCOLAX) packet 17 g  17 g Oral Daily PRN Shalhoub, Sherryll Burger, MD       QUEtiapine (SEROQUEL) tablet 50 mg  50 mg Oral Q24H Elgergawy, Silver Huguenin, MD   50 mg at 07/26/21 2027   rosuvastatin (CRESTOR) tablet 10 mg  10 mg Oral Daily Rosalin Hawking, MD   10 mg at 07/27/21 2111     Discharge Medications: Please see discharge summary for a list of discharge medications.  Relevant Imaging Results:  Relevant Lab Results:   Additional Information SSN: [id number]. Pfizer 05/25/19, 06/15/10, 02/07/20.  Benard Halsted, LCSW

## 2021-07-27 NOTE — Care Management Important Message (Signed)
Important Message  Patient Details  Name: Donald Hawkins MRN: [id number] Date of Birth: 09/25/1928   Medicare Important Message Given:  Yes     Molleigh Huot 07/27/2021, 4:11 PM

## 2021-07-27 NOTE — TOC Initial Note (Signed)
Transition of Care [facility]) - Initial/Assessment Note    Patient Details  Name: Donald Hawkins MRN: [id number] Date of Birth: Jul 11, 1928  [facility]) CM/SW Contact:    Benard Halsted, LCSW Phone Number: 07/27/2021, 4:46 PM  Clinical Narrative:                 CSW received consult for possible SNF placement at time of discharge versus CIR. CSW spoke with patient's son, Nicki Reaper. CSW answered questions regarding what insurance covers and for how long. Nicki Reaper stated that if long term care is needed, it would private pay and he is aware of cost. He reports preference for [facility] if possible but would like to discuss situation with his wife to see if CIR or SNF is better at this time since insurance is not likely to approve both. Patient has received COVID vaccines. CSW will send out referrals for review as a secondary option and will follow up with son on their decision.   Skilled Nursing Rehab Facilities-   [email or web address]   Ratings out of 5 possible   Name Address  Phone # [place] Inspection Overall  [facility] [address] [place] '4 5 2 3  '$ [facility]  [address] [place], [place] [phone] '3 2 5 5  '$ [facility] [place], [place] '3 1 1 1  '$ [place], [place] '3 2 4 4  '$ [facility] [address] [place] '1 1 2 1  '$ [place]. [address] [place] [phone] '2 1 4 3  '$ [facility] [address] [place] '5 2 3 4  '$ [facility] [address] [place] '5 2 2 3  '$ [address] ([place]) [place] '5 1 2 2  '$ [facility] [address], [facility] [phone] '4 1 2 1  '$ [facility] [address] [place], [facility] [phone] '4 1 2 1  '$ [facility] ([place]) [place]. [address],  [place] [phone] '4 1 1 1  '$ [place] [address] [place] '3 2 4 4          '$ [place], [place]      [facility] [place] '4 2 3 3  '$ [facility] [place] [address], [place] '4 1 5 4  '$ [facility], [place] [address] [place] [address], [place] [phone] '2 1 1 1  '$ [facility] [address], [facility] [phone] '2 1 3 2          '$ [place] (no [facility]) [place] [address], [place] [phone] '4 5 5 5  '$ [facility] (No Humana) [address], [place] '3 1 4 3  '$ [facility] (No [employer]) [place], [place] '5 5 5 5  '$ [facility] [address], [address] [phone] '3 2 4 4  '$ [place] [address] [place] '1 1 2 1  '$ [facility] [address] '2 1 1 1  '$ [facility], [place] '5 2 4 5  '$ [facility] [address] [place] '3 1 1 1  '$ [facility] [place], [place] '2 1 2 1          '$ [facility] [address], [place] [phone] '1 1 1 1  '$ [place] [address], [place]  [phone] '2 4 2 2  '$ [facility] [place] [address], [place] [phone] '5 2 3 4  '$ [facility] [address], [place] '2 1 1 1  '$ [place] (No Humana) [address] [place] '2 1 3 2  '$ [facility] [address] [place] [phone] '3 1 1 1          '$ [facility] [place], [place] '5 4 5 5  '$ [facility])  [address], [place] '2 2 3 3  '$ [facility]) [place] [address] [place], [place] [phone] '3 2 4 4  '$ [facility] [address], [place] '4 3 4 4  '$ [address] [place] [phone] '3 3 1 1  '$ [place]  [facility]) [place] [phone] '2 2 4 4       '$ Barriers to Discharge: Insurance Authorization, Continued Medical Work up   Patient Goals and CMS Choice Patient states their goals for this hospitalization and ongoing recovery are:: Rehab CMS Medicare.gov Compare Post Acute Care list provided to:: Patient Represenative (must comment) Choice offered to / list presented to : Adult Children  Expected Discharge Plan and Services   In-house Referral: Clinical Social Work     Living arrangements for the past 2 months: Single Family Home                                      Prior Living Arrangements/Services Living arrangements for the past 2 months: Single Family Home Lives with:: Adult Children Patient language and need for interpreter reviewed:: Yes Do you feel safe going back to the place where you live?: Yes      Need for Family Participation in Patient Care: Yes (Comment) Care giver support system in place?: Yes (comment)   Criminal Activity/Legal Involvement Pertinent to Current Situation/Hospitalization: No - Comment as needed  Activities of Daily Living Home Assistive Devices/Equipment: Wheelchair, Environmental consultant (specify type), Cane (specify quad or straight) ADL Screening (condition at time of admission) Patient's cognitive ability adequate to safely complete daily activities?: Yes Is the patient deaf or have difficulty hearing?: Yes Does the patient have difficulty seeing, even when wearing glasses/contacts?: No Does the patient have difficulty concentrating, remembering, or making decisions?: No Patient able to express need for assistance with ADLs?: Yes Does the patient have difficulty dressing or bathing?: No Independently performs ADLs?: Yes (appropriate for developmental age) Does the patient have difficulty walking or climbing stairs?: No Weakness of Legs: None Weakness of Arms/Hands: None  Permission Sought/Granted Permission  sought to share information with : Facility Sport and exercise psychologist, Family Supports Permission granted to share information with : No  Share Information with NAME: Nicki Reaper  Permission granted to share info w AGENCY: SNFs  Permission granted to share info w Relationship: Son  Permission granted to share info w Contact Information: [phone]  Emotional Assessment Appearance:: Appears stated age Attitude/Demeanor/Rapport: Unable to Assess Affect (typically observed): Unable to Assess Orientation: : Oriented to Self Alcohol / Substance Use: Not Applicable Psych Involvement: No (comment)  Admission diagnosis:  Acute embolic stroke [facility]) [L07.8] Acute cardioembolic stroke [facility]) [M75.4] Cerebrovascular accident (CVA), unspecified mechanism ([place]) [I63.9] Patient Active Problem List   Diagnosis Date Noted   Protein-calorie malnutrition, severe 07/23/2021   Acute cardioembolic stroke ([place]) 49/20/1007   Essential  hypertension 07/22/2021   Glaucoma 11/91/4782   Acute embolic stroke ([place]) 95/62/1308   Neoplasm of uncertain behavior of skin 06/10/2021   Imbalance 06/08/2021   Left ear hearing loss 06/08/2021   Encounter for preoperative dental examination 03/20/2021   Long term current use of anticoagulant therapy 03/20/2021   Teeth missing 03/20/2021   Accretions on teeth 03/20/2021   Defective dental restoration 03/20/2021   Gingival recession, generalized 03/20/2021   Clinical xerostomia 03/20/2021   Epistaxis 03/12/2021   Secondary malignant neoplasm of cervical lymph node ([place]) 02/25/2021   Metastatic squamous cell carcinoma to head and neck ([place]) 01/26/2021   Paroxysmal atrial fibrillation ([place]) 01/22/2021   Secondary hypercoagulable state ([place]) 01/22/2021   Primary squamous cell carcinoma of left ear 01/14/2021   PCP:  Lorrene Reid, PA-C Pharmacy:   [place], [place] [address] Phone: [phone]  Fax: [phone]  [place], [place] [address] Phone: [phone] Fax: [phone]  [facility] ([facility] ) - [place], [place] [address] Phone: [phone] Fax: [phone]     Social Determinants of Health (SDOH) Interventions    Readmission Risk Interventions     View : No data to display.

## 2021-07-27 NOTE — Progress Notes (Signed)
Inpatient Rehab Admissions Coordinator:  Spoke with pt's son Nicki Reaper on the phone. Discussed the difference between CIR and SNF. He would like to speak with TOC about SNF options. Then he and his wife will discuss options and decide about appropriate rehab options. TOC made aware. Will continue to follow.   [place] Curry, [place], [place] Admissions Coordinator [phone]

## 2021-07-27 NOTE — Progress Notes (Signed)
Physical Therapy Treatment Patient Details Name: Donald Hawkins MRN: [id number] DOB: December 18, 1928 Today's Date: 07/27/2021   History of Present Illness Pt is a 88 year old man admitted with R side weakness, dysarthria and R facial droop on 07/22/21. MRI+ multiple small foci infarcts in B cerebrum and cerebellum. 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    PT Comments    Pt asleep in bed with son in room on entry. Pt requiring increased stimulation to rouse this morning. Once awake pt participatory in therapy but still drowsy. Pt requires maxAx2 for coming to seated EoB, initially requiring total A posteriorly for sitting balance. With increased cuing pt able to achieve short bouts of independent sitting. Attempted to come to standing with RW and 2 person HHA, however with total A not able to achieve fully upright due to retropulsion and inability to fully extend knees. Also attempted lateral scoot with total Ax2 and pt with increased fatigue and retropulsion. Pt participatory through out and given he needs all 3 disciplines of therapy pt would benefit from AIR placement prior to discharge home with family. PT will continue to follow acutely.    Recommendations for follow up therapy are one component of a multi-disciplinary discharge planning process, led by the attending physician.  Recommendations may be updated based on patient status, additional functional criteria and insurance authorization.  Follow Up Recommendations  Acute inpatient rehab (3hours/day)     Assistance Recommended at Discharge Frequent or constant Supervision/Assistance  Patient can return home with the following A lot of help with bathing/dressing/bathroom;Two people to help with walking and/or transfers;Assist for transportation;Help with stairs or ramp for entrance   Equipment Recommendations  Rolling walker (2 wheels)    Recommendations for Other Services Rehab  consult     Precautions / Restrictions Precautions Precautions: Fall Restrictions Weight Bearing Restrictions: No     Mobility  Bed Mobility Overal bed mobility: Needs Assistance Bed Mobility: Supine to Sit, Sit to Supine     Supine to sit: Max assist, +2 for physical assistance Sit to supine: Max assist   General bed mobility comments: max A x2 and maximal multimodal cuing for initiation and sequencing of LE off bed, pt initiates bringing trunk to upright but needs max A to attain upright. Once in seated pt requires increased posterior support and maximal multimodal cuing for forward lean and balancing in sitting, after attempt to come to standing pt needed to be returned to bed to be able to safely move the bed for appropriate placement of recliner. Pt able to manage trunk back into bed needing max A for returning R LE to bed. Pt with similar sequencing of coming to sit EoB a second time.    Transfers Overall transfer level: Needs assistance Equipment used: Rolling walker (2 wheels) Transfers: Sit to/from Stand, Bed to chair/wheelchair/BSC Sit to Stand: Total assist, +2 physical assistance, From elevated surface          Lateral/Scoot Transfers: Total assist, +2 physical assistance General transfer comment: Pt reportedly used RW at home, so first attempt to standing with +2 assist, despite cuing pt unable to properly place hands for power up. Provided total Ax2 and pt able to clear hips from bed but limited by inability to extend knees to come to upright. Sat back down. On second attempt utilized 2 person HHA, again able to clear hips however now pt with increased retropulsion. After recliner moved in place attempted  lateral scoot to R, pt with similar retropulsive response unable to progress scuoots with totalAx2       Modified Rankin (Stroke Patients Only) Modified Rankin (Stroke Patients Only) Pre-Morbid Rankin Score: Moderate disability Modified Rankin: Moderately severe  disability     Balance Overall balance assessment: Needs assistance Sitting-balance support: Feet supported, Bilateral upper extremity supported Sitting balance-Leahy Scale: Poor Sitting balance - Comments: able to achieve sitting balance for short bouts of ~20 sec limited by increased posterior lean. Postural control: Posterior lean Standing balance support: Single extremity supported, Bilateral upper extremity supported, During functional activity Standing balance-Leahy Scale: Zero Standing balance comment: unable to achieve fully upright                            Cognition Arousal/Alertness: Awake/alert Behavior During Therapy: WFL for tasks assessed/performed Overall Cognitive Status: Difficult to assess Area of Impairment: Problem solving, Safety/judgement, Following commands                       Following Commands: Follows one step commands with increased time (and multimodal cues) Safety/Judgement: Decreased awareness of safety, Decreased awareness of deficits Awareness: Emergent Problem Solving: Slow processing, Requires verbal cues, Requires tactile cues, Decreased initiation, Difficulty sequencing General Comments: pt with decreased awareness of posterior lean especially with trying to come to upright, with increased time and multimodal cuing able to follow commands inconsistently           General Comments General comments (skin integrity, edema, etc.): VSS on RA      Pertinent Vitals/Pain Pain Assessment Pain Assessment: Faces Faces Pain Scale: No hurt     PT Goals (current goals can now be found in the care plan section) Acute Rehab PT Goals Patient Stated Goal: to go to rehab PT Goal Formulation: With patient Time For Goal Achievement: 08/06/21 Potential to Achieve Goals: Good Progress towards PT goals: Not progressing toward goals - comment (pt with increase fatigue)    Frequency    Min 4X/week      PT Plan Current plan  remains appropriate    Co-evaluation PT/OT/SLP Co-Evaluation/Treatment: Yes Reason for Co-Treatment: Complexity of the patient's impairments (multi-system involvement) PT goals addressed during session: Mobility/safety with mobility        AM-PAC PT "6 Clicks" Mobility   Outcome Measure  Help needed turning from your back to your side while in a flat bed without using bedrails?: A Little Help needed moving from lying on your back to sitting on the side of a flat bed without using bedrails?: Total Help needed moving to and from a bed to a chair (including a wheelchair)?: Total Help needed standing up from a chair using your arms (e.g., wheelchair or bedside chair)?: Total Help needed to walk in hospital room?: Total Help needed climbing 3-5 steps with a railing? : Total 6 Click Score: 8    End of Session Equipment Utilized During Treatment: Gait belt Activity Tolerance: Patient limited by fatigue Patient left: with call bell/phone within reach;with family/visitor present;in bed;with bed alarm set;Other (comment) (in chair position) Nurse Communication: Mobility status PT Visit Diagnosis: Unsteadiness on feet (R26.81);Muscle weakness (generalized) (M62.81);Hemiplegia and hemiparesis Hemiplegia - Right/Left: Right Hemiplegia - dominant/non-dominant: Dominant Hemiplegia - caused by: Cerebral infarction     Time: 9735-3299 PT Time Calculation (min) (ACUTE ONLY): 49 min  Charges:  $Therapeutic Activity: 23-37 mins  Merie Wulf B. Migdalia Dk PT, DPT Acute Rehabilitation Services Please use secure chat or  Call Office [phone]    [place] 07/27/2021, 9:46 AM

## 2021-07-27 NOTE — Progress Notes (Signed)
Occupational Therapy Treatment Patient Details Name: Donald Hawkins MRN: [id number] DOB: May 20, 1928 Today's Date: 07/27/2021   History of present illness Pt is a 88 year old man admitted with R side weakness, dysarthria and R facial droop on 07/22/21. MRI+ multiple small foci infarcts in B cerebrum and cerebellum. 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   OT comments  Patient making incremental progress towards goals in skilled OT session. Patient's session encompassed attempts at sit<>stands, bed mobility, and ADLs. Patient lethargic upon arrival with increased time to arouse. Patient max to total A at bed level to attempt to wash face with therapist providing assist to bring hand up to face with minimal activation noted. Patient with significant posterior lean when sitting EOB, with max A of 2 to initially transition, then total A of 2 for further attempts. OT and PT attempting multiple sit<>stands with RW and with HHA with patient unable to attempt standing with each bout (max A-total A x2). PT and OT then attempting lateral scoot transfer to chair however due to significant retropulsion unable to advance out of chair safely (total A of 2 with increased support provided at trunk and knees blocked). Patient placed in chair position at end of session with education with regard to disposition and care initiated with son who was present for session. OT continuing to recommend AIR placement with all 3 disciplines provided. OT will continue to follow acutely.    Recommendations for follow up therapy are one component of a multi-disciplinary discharge planning process, led by the attending physician.  Recommendations may be updated based on patient status, additional functional criteria and insurance authorization.    Follow Up Recommendations  Acute inpatient rehab (3hours/day)    Assistance Recommended at Discharge Frequent or constant  Supervision/Assistance  Patient can return home with the following  Two people to help with walking and/or transfers;A lot of help with bathing/dressing/bathroom;Assistance with cooking/housework;Assistance with feeding;Direct supervision/assist for medications management;Direct supervision/assist for financial management;Assist for transportation;Help with stairs or ramp for entrance   Equipment Recommendations  Other (comment) (will continue to assess)    Recommendations for Other Services      Precautions / Restrictions Precautions Precautions: Fall Restrictions Weight Bearing Restrictions: No       Mobility Bed Mobility Overal bed mobility: Needs Assistance Bed Mobility: Supine to Sit, Sit to Supine     Supine to sit: Max assist, +2 for physical assistance Sit to supine: Max assist   General bed mobility comments: max A x2 and maximal multimodal cuing for initiation and sequencing of LE off bed, pt initiates bringing trunk to upright but needs max A to attain upright. Once in seated pt requires increased posterior support and maximal multimodal cuing for forward lean and balancing in sitting, after attempt to come to standing pt needed to be returned to bed to be able to safely move the bed for appropriate placement of recliner. Pt able to manage trunk back into bed needing max A for returning R LE to bed. Pt with similar sequencing of coming to sit EoB a second time.    Transfers Overall transfer level: Needs assistance Equipment used: Rolling walker (2 wheels) Transfers: Sit to/from Stand, Bed to chair/wheelchair/BSC Sit to Stand: Total assist, +2 physical assistance, From elevated surface          Lateral/Scoot Transfers: Total assist, +2 physical assistance General transfer comment: Pt reportedly used RW at home, so first attempt to  standing with +2 assist, despite cuing pt unable to properly place hands for power up. Provided total Ax2 and pt able to clear hips from  bed but limited by inability to extend knees to come to upright. Sat back down. On second attempt utilized 2 person HHA, again able to clear hips however now pt with increased retropulsion. After recliner moved in place attempted lateral scoot to R, pt with similar retropulsive response unable to progress scuoots with totalAx2     Balance Overall balance assessment: Needs assistance Sitting-balance support: Feet supported, Bilateral upper extremity supported Sitting balance-Leahy Scale: Poor Sitting balance - Comments: able to achieve sitting balance for short bouts of ~20 sec limited by increased posterior lean. Postural control: Posterior lean Standing balance support: Single extremity supported, Bilateral upper extremity supported, During functional activity Standing balance-Leahy Scale: Zero Standing balance comment: unable to achieve fully upright                           ADL either performed or assessed with clinical judgement   ADL Overall ADL's : Needs assistance/impaired     Grooming: Wash/dry face;Wash/dry hands;Maximal assistance;Total assistance;Bed level Grooming Details (indicate cue type and reason): unable to complete without significant assist, minimal activation noted to grasp washcloth despite extra time provided                 Toilet Transfer: Total assistance;+2 for physical assistance;+2 for safety/equipment Toilet Transfer Details (indicate cue type and reason): unable to stand despite 5-6 attempts, with HHA and with RW         Functional mobility during ADLs: Total assistance;+2 for physical assistance;+2 for safety/equipment;Cueing for safety;Cueing for sequencing;Rolling walker (2 wheels) General ADL Comments: Patient with decrease in participation and capabilty since initial evaluation, now requiring +2 max-total assist for all aspects of care    Extremity/Trunk Assessment              Vision       Perception     Praxis       Cognition Arousal/Alertness: Lethargic Behavior During Therapy: WFL for tasks assessed/performed Overall Cognitive Status: Difficult to assess Area of Impairment: Problem solving, Safety/judgement, Following commands                       Following Commands: Follows one step commands with increased time (and multimodal cues) Safety/Judgement: Decreased awareness of safety, Decreased awareness of deficits Awareness: Emergent Problem Solving: Slow processing, Requires verbal cues, Requires tactile cues, Decreased initiation, Difficulty sequencing General Comments: pt with decreased awareness of posterior lean especially with trying to come to upright, with increased time and multimodal cuing able to follow commands inconsistently        Exercises      Shoulder Instructions       General Comments VSS on RA    Pertinent Vitals/ Pain       Pain Assessment Pain Assessment: Faces Faces Pain Scale: No hurt  Home Living                                          Prior Functioning/Environment              Frequency  Min 3X/week        Progress Toward Goals  OT Goals(current goals can now be found in the care plan  section)  Progress towards OT goals: Progressing toward goals  Acute Rehab OT Goals OT Goal Formulation: With patient Time For Goal Achievement: 08/06/21 Potential to Achieve Goals: [place]  Plan Discharge plan remains appropriate    Co-evaluation      Reason for Co-Treatment: Complexity of the patient's impairments (multi-system involvement) PT goals addressed during session: Mobility/safety with mobility OT goals addressed during session: ADL's and self-care;Strengthening/ROM      AM-PAC OT "6 Clicks" Daily Activity     Outcome Measure   Help from another person eating meals?: A Lot Help from another person taking care of personal grooming?: A Lot Help from another person toileting, which includes using toliet, bedpan,  or urinal?: Total Help from another person bathing (including washing, rinsing, drying)?: Total Help from another person to put on and taking off regular upper body clothing?: Total Help from another person to put on and taking off regular lower body clothing?: Total 6 Click Score: 8    End of Session Equipment Utilized During Treatment: Rolling walker (2 wheels);Gait belt  OT Visit Diagnosis: Unsteadiness on feet (R26.81);Other abnormalities of gait and mobility (R26.89);Hemiplegia and hemiparesis;Muscle weakness (generalized) (M62.81);Other symptoms and signs involving cognitive function   Activity Tolerance Patient limited by fatigue;Patient limited by lethargy   Patient Left in bed;with call bell/phone within reach;with bed alarm set;with family/visitor present;Other (comment) (in chair position)   Nurse Communication Mobility status        Time: [phone] OT Time Calculation (min): 49 min  Charges: OT General Charges $OT Visit: 1 Visit OT Treatments $Self Care/Home Management : 8-22 mins  Corinne Ports E. Barbra Miner, OTR/L Acute Rehabilitation Services [phone] [place] 07/27/2021, 10:09 AM

## 2021-07-27 NOTE — Progress Notes (Signed)
   07/27/21 0320  Assess: MEWS Score  Temp 98.2 F (36.8 C)  BP (!) 96/56  Pulse Rate 60  Resp 18  Assess: MEWS Score  MEWS Temp 0  MEWS Systolic 1  MEWS Pulse 0  MEWS RR 0  MEWS LOC 1  MEWS Score 2  MEWS Score Color Yellow  Assess: if the MEWS score is Yellow or Red  Were vital signs taken at a resting state? Yes  Focused Assessment Change from prior assessment (see assessment flowsheet)  Early Detection of Sepsis Score *See Row Information* Low  MEWS guidelines implemented *See Row Information* Yes  Treat  MEWS Interventions Administered prn meds/treatments;Escalated (See documentation below)  Pain Scale 0-10  Pain Score 0  Escalate  MEWS: Escalate Yellow: discuss with charge nurse/RN and consider discussing with provider and RRT  Notify: Charge Nurse/RN  Name of Charge Nurse/RN Notified Nikki,RN  Date Charge Nurse/RN Notified 07/27/21  Time Charge Nurse/RN Notified 0400  Notify: Provider  Provider Name/Title Dr. [place] Crane  Date Provider Notified 07/27/21  Time Provider Notified 0350  Method of Notification Page  Notification Reason Change in status  Provider response See new orders  Date of Provider Response 07/27/21  Time of Provider Response [id number]

## 2021-07-27 NOTE — Progress Notes (Signed)
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  Work-up significant for cardioembolic acute CVA, he was admitted for further management.  Followed by stroke team, Eliquis has been changed to Pradaxa, patient hospital stay was significant for hospital delirium.     Assessment & Plan:   Principal Problem:   Acute cardioembolic stroke [facility]) Active Problems:   Paroxysmal atrial fibrillation (HCC)   Essential hypertension   Metastatic squamous cell carcinoma to head and neck (HCC)   Long term current use of anticoagulant therapy   Glaucoma   Acute embolic stroke ([place])   Protein-calorie malnutrition, severe    Acute cardioembolic stroke ([place]) -MRI brain significant for multiple small foci of acute ischemia within both cerebral and cerebellar hemispheres, pattern suggestive of emboli from cardiac or proximal aortic source. -Patient on Eliquis, reports compliance, so this would be considered Eliquis failure, transitioned to Pradaxa as discussed with neurology. -Remains n.p.o. after SLP evaluation, plan for MBSS today, if fails likely will need core track. -Follow on 2D echo. -LDL is 74, started on low-dose statin - A1c is 5.5 last April -Seen by PT/OT, recommendations for CIR. -He appears to be with significant dysphagia today, will reconsult SLP.    Paroxysmal atrial fibrillation (HCC) -Rate uncontrolled,  improved with low-dose amiodarone, avoiding other regimens given stable BP. -See above discussion regarding Eliquis >>> Pradaxa   Hospital delirium -Patient remains to be having significant issues with hospital delirium, mainly at nighttime, has improved after starting Seroquel, currently at 50 mg every 8 PM. -Confusion and delirium has improved, but he is more sleepy during the day, I will discontinue his as needed trazodone.  Essential hypertension - Permissive hypertension - PRN IV Hydralazine for SBP > 220 or DBP > 115 -Blood pressure is acceptable without any medications.   Metastatic squamous cell carcinoma to head and neck (HCC) Diagnosed 2022 status post left superficial parotidectomy and a modified radical neck dissection completed on January 26, 2021 S/P radiation therapy with Dr. Isidore Moos Continue outpatient follow up with Dr. Alen Blew   Glaucoma Home regimen of eye drops resumed.   Long term current use of anticoagulant therapy On Chronic Eliquis for atrial fibrillation, CHADSVASC score of 3.  Holding for now per Neurology recommendations     DVT prophylaxis: Eliquis>> pradaxa Code Status: Full Family Communication: Son at bedside Disposition:   Status is: Inpatient Remains inpatient appropriate because: Patient can be discharged to CIR when bed is available.   Consultants:  Neurology   Subjective:  No significant events overnight, he had a good night sleep yesterday, nurse reported patient with some coughing while being fed at breakfast.  Objective: Vitals:   07/27/21 0320 07/27/21 0436 07/27/21 0752 07/27/21 1003  BP: (!) 96/56 129/77 109/75   Pulse: 60  70   Resp: 18  17   Temp: 98.2 F (36.8 C)  97.8 F (36.6 C)   TempSrc: Axillary  Axillary   SpO2:   99% 99%  Weight:  Height:        Intake/Output Summary (Last 24 hours) at 07/27/2021 1012 Last data filed at 07/26/2021 1342 Gross per 24 hour  Intake --  Output 400 ml  Net -400 ml   Filed  Weights   07/23/21 0100  Weight: 62.5 kg    Examination:  Frail elderly male , laying in bed, no apparent distress Symmetrical Chest wall movement, Good air movement bilaterally, CTAB RRR,No Gallops,Rubs or new Murmurs, No Parasternal Heave +ve B.Sounds, Abd Soft, No tenderness, No rebound - guarding or rigidity. No Cyanosis, Clubbing or edema, No new Rash or bruise         Data Reviewed: I have personally reviewed following labs and imaging studies  CBC: Recent Labs  Lab 07/22/21 1632 07/22/21 1931 07/23/21 0406 07/27/21 0128  WBC 6.4  --  6.3 9.3  NEUTROABS  --   --  3.7  --   HGB 13.9 13.9 13.6 12.9*  HCT 42.3 41.0 38.7* 36.3*  MCV 98.8  --  93.0 94.0  PLT 155  --  136* 129*    Basic Metabolic Panel: Recent Labs  Lab 07/22/21 1632 07/22/21 1931 07/23/21 0406 07/27/21 0128  NA 136 137 138 137  K 4.0 4.0 3.6 3.7  CL 105 106 110 110  CO2 21*  --  24 23  GLUCOSE 120* 104* 93 136*  BUN '10 11 8 13  '$ CREATININE 0.82 0.80 0.86 0.93  CALCIUM 9.3  --  9.0 8.4*  MG  --   --  1.9  --     GFR: Estimated Creatinine Clearance: 45.7 mL/min (by C-G formula based on SCr of 0.93 mg/dL).  Liver Function Tests: Recent Labs  Lab 07/22/21 1632 07/23/21 0406  AST 25 21  ALT 12 11  ALKPHOS 82 70  BILITOT 0.8 1.0  PROT 7.0 6.3*  ALBUMIN 3.4* 3.0*    CBG: Recent Labs  Lab 07/22/21 1629  GLUCAP 120*     Recent Results (from the past 240 hour(s))  Resp Panel by RT-PCR (Flu A&B, Covid) Nasopharyngeal Swab     Status: None   Collection Time: 07/22/21  9:08 PM   Specimen: Nasopharyngeal Swab; Nasopharyngeal(NP) swabs in vial transport medium  Result Value Ref Range Status   SARS Coronavirus 2 by RT PCR NEGATIVE NEGATIVE Final    Comment: (NOTE) SARS-CoV-2 target nucleic acids are NOT DETECTED.  The SARS-CoV-2 RNA is generally detectable in upper respiratory specimens during the acute phase of infection. The lowest concentration of SARS-CoV-2 viral copies this  assay can detect is 138 copies/mL. A negative result does not preclude SARS-Cov-2 infection and should not be used as the sole basis for treatment or other patient management decisions. A negative result may occur with  improper specimen collection/handling, submission of specimen other than nasopharyngeal swab, presence of viral mutation(s) within the areas targeted by this assay, and inadequate number of viral copies(<138 copies/mL). A negative result must be combined with clinical observations, patient history, and epidemiological information. The expected result is Negative.  Fact Sheet for Patients:  EntrepreneurPulse.com.au  Fact Sheet for Healthcare Providers:  IncredibleEmployment.be  This test is no t yet approved or cleared by the [place] FDA and  has been authorized for detection and/or diagnosis of SARS-CoV-2 by FDA under an Emergency Use Authorization (EUA). This EUA will remain  in effect (meaning this test can be used) for the duration of the COVID-19 declaration under Section 564(b)(1) of the Act, 21 U.S.C.section 360bbb-3(b)(1), unless the authorization  is terminated  or revoked sooner.       Influenza A by PCR NEGATIVE NEGATIVE Final   Influenza B by PCR NEGATIVE NEGATIVE Final    Comment: (NOTE) The Xpert Xpress SARS-CoV-2/FLU/RSV plus assay is intended as an aid in the diagnosis of influenza from Nasopharyngeal swab specimens and should not be used as a sole basis for treatment. Nasal washings and aspirates are unacceptable for Xpert Xpress SARS-CoV-2/FLU/RSV testing.  Fact Sheet for Patients: EntrepreneurPulse.com.au  Fact Sheet for Healthcare Providers: IncredibleEmployment.be  This test is not yet approved or cleared by the [place] FDA and has been authorized for detection and/or diagnosis of SARS-CoV-2 by FDA under an Emergency Use Authorization (EUA). This EUA will  remain in effect (meaning this test can be used) for the duration of the COVID-19 declaration under Section 564(b)(1) of the Act, 21 U.S.C. section 360bbb-3(b)(1), unless the authorization is terminated or revoked.  Performed at [facility], [place] [address] [place], [place] [address]          Radiology Studies: No results found.      Scheduled Meds:  brimonidine  1 drop Both Eyes BID   dabigatran  150 mg Oral Q12H   dorzolamide-timolol  1 drop Both Eyes BID   latanoprost  1 drop Both Eyes QHS   melatonin  5 mg Oral QHS   QUEtiapine  50 mg Oral Q24H   rosuvastatin  10 mg Oral Daily   Continuous Infusions:  sodium chloride 50 mL/hr at 07/25/21 1841      LOS: 5 days       Phillips Climes, MD [facility]   To contact the attending provider between 7A-7P or the covering provider during after hours 7P-7A, please log into the web site [email or web address] and access using universal [place] password for that web site. If you do not have the password, please call the hospital operator.  07/27/2021, 10:12 AM

## 2021-07-27 NOTE — TOC Benefit Eligibility Note (Addendum)
Patient Advocate Encounter  Prior Authorization for Dabigatran Etexilate Mesylate (Pradaxa) '150MG'$  capsules has been approved.    PA# [id number] Effective dates: 07/23/2021 through 03/07/2022  Patients co-pay is $100.00.     Lyndel Safe, [place] Patient Advocate Specialist [facility] Patient Advocate Team Direct Number: [phone]  Fax: [phone]

## 2021-07-28 ENCOUNTER — Ambulatory Visit: Payer: Medicare Other | Admitting: Physician Assistant

## 2021-07-28 DIAGNOSIS — R410 Disorientation, unspecified: Secondary | ICD-10-CM | POA: Diagnosis not present

## 2021-07-28 DIAGNOSIS — I10 Essential (primary) hypertension: Secondary | ICD-10-CM | POA: Diagnosis not present

## 2021-07-28 DIAGNOSIS — I480 Paroxysmal atrial fibrillation: Secondary | ICD-10-CM | POA: Diagnosis not present

## 2021-07-28 DIAGNOSIS — I639 Cerebral infarction, unspecified: Secondary | ICD-10-CM | POA: Diagnosis not present

## 2021-07-28 MED ORDER — TRAZODONE HCL 50 MG PO TABS
50.0000 mg | ORAL_TABLET | Freq: Every day | ORAL | Status: DC
Start: 2021-07-28 — End: 2021-07-29
  Administered 2021-07-28: 50 mg via ORAL
  Filled 2021-07-28: qty 1

## 2021-07-28 NOTE — TOC Progression Note (Addendum)
Transition of Care [facility]) - Progression Note    Patient Details  Name: Donald Hawkins MRN: [id number] Date of Birth: 1928/03/17  Transition of Care [facility]) CM/SW [place], LCSW Phone Number: 07/28/2021, 8:53 AM  Clinical Narrative:    8:53am-CSW received call from patient's son requesting CSW check into [facility] for SNF rehab. CSW sent request to [facility] to review.   10am-CSW made patient's son aware that [facility] can accept patient in a semi-private room. Son reported understanding and will call CSW back.  11:56am-CSW received return call from son accepting semi-private bed at [facility] initiated insurance approval process, Ref# [id number].      Barriers to Discharge: Ship broker, Continued Medical Work up  [employer] [facility]   In-house Referral: Clinical Social Work     Living arrangements for the past 2 months: Single Family Home                                       Social Determinants of Health (SDOH) Interventions    Readmission Risk Interventions     View : No data to display.

## 2021-07-28 NOTE — Progress Notes (Signed)
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  Work-up significant for cardioembolic acute CVA, he was admitted for further management.  Followed by stroke team, Eliquis has been changed to Pradaxa, patient hospital stay was significant for hospital delirium.     Assessment & Plan:   Principal Problem:   Acute cardioembolic stroke [facility]) Active Problems:   Paroxysmal atrial fibrillation (HCC)   Essential hypertension   Metastatic squamous cell carcinoma to head and neck (HCC)   Long term current use of anticoagulant therapy   Glaucoma   Acute embolic stroke ([place])   Protein-calorie malnutrition, severe    Acute cardioembolic stroke ([place]) -MRI brain significant for multiple small foci of acute ischemia within both cerebral and cerebellar hemispheres, pattern suggestive of emboli from cardiac or proximal aortic source. -Patient on Eliquis, reports compliance, so this would be considered Eliquis failure, transitioned to Pradaxa as discussed with neurology. -Remains n.p.o. after SLP evaluation, plan for MBSS today, if fails likely will need core track. -Follow on 2D echo. -LDL is 74, started on low-dose statin - A1c is 5.5 last April -Seen by PT/OT, recommendations for CIR. -With significant dysphagia, is currently on dysphagia 3 with thin liquid, patient with difficulty of chewing food, so I will downgrade to  dysphagia 2 with thin liquids at that diet is more minced and easier to chew for him.   Paroxysmal atrial fibrillation (HCC) -Rate uncontrolled, improved with low-dose amiodarone, avoiding other regimens given stable BP. -See above discussion regarding Eliquis >>> Pradaxa   Hospital delirium -Patient remains to be having significant issues with hospital delirium, mainly at nighttime, has improved after starting Seroquel, currently at 50 mg every 8 PM.  He still having poor night sleep, so he is resumed on scheduled trazodone 50 mg oral daily.  Essential hypertension - Permissive hypertension - PRN IV Hydralazine for SBP > 220 or DBP > 115 -Blood pressure is acceptable without any medications.   Metastatic squamous cell carcinoma to head and neck (HCC) Diagnosed 2022 status post left superficial parotidectomy and a modified radical neck dissection completed on January 26, 2021 S/P radiation therapy with Dr. Isidore Moos Continue outpatient follow up with Dr. Alen Blew   Glaucoma Home regimen of eye drops resumed.   Long term current use of anticoagulant therapy On Chronic Eliquis for atrial fibrillation, CHADSVASC score of 3.  Holding for now per Neurology recommendations     DVT prophylaxis: Eliquis>> pradaxa Code Status: Full Family Communication: Son at bedside daily Disposition:   Status is: Inpatient Remains inpatient appropriate because: SNF   Consultants:  Neurology   Subjective:  No significant events yesterday, he had good oral intake, but overnight with some insomnia, but was much less restless and agitated.  Objective: Vitals:   07/27/21 1949 07/27/21 2320 07/28/21 0342 07/28/21 0736  BP: 112/63 136/71 129/71 121/74  Pulse: 92 (!) 106 90 86  Resp: '20 18 20 17  '$ Temp: 99 F (37.2 C) 98.8 F (37.1 C) 99.5 F (  37.5 C) 98 F (36.7 C)  TempSrc: Oral Oral Oral Axillary  SpO2: 92% 93%  97%  Weight:      Height:        Intake/Output Summary (Last 24 hours) at  07/28/2021 1134 Last data filed at 07/28/2021 1055 Gross per 24 hour  Intake 1580.83 ml  Output --  Net 1580.83 ml   Filed Weights   07/23/21 0100  Weight: 62.5 kg    Examination:  Frail elderly male , laying in bed, frail, chronically ill-appearing, no apparent distress, with significant dysarthria Symmetrical Chest wall movement, Good air movement bilaterally, CTAB RRR,No Gallops,Rubs or new Murmurs, No Parasternal Heave +ve B.Sounds, Abd Soft, No tenderness, No rebound - guarding or rigidity. No Cyanosis, Clubbing or edema, No new Rash or bruise         Data Reviewed: I have personally reviewed following labs and imaging studies  CBC: Recent Labs  Lab 07/22/21 1632 07/22/21 1931 07/23/21 0406 07/27/21 0128  WBC 6.4  --  6.3 9.3  NEUTROABS  --   --  3.7  --   HGB 13.9 13.9 13.6 12.9*  HCT 42.3 41.0 38.7* 36.3*  MCV 98.8  --  93.0 94.0  PLT 155  --  136* 129*    Basic Metabolic Panel: Recent Labs  Lab 07/22/21 1632 07/22/21 1931 07/23/21 0406 07/27/21 0128  NA 136 137 138 137  K 4.0 4.0 3.6 3.7  CL 105 106 110 110  CO2 21*  --  24 23  GLUCOSE 120* 104* 93 136*  BUN '10 11 8 13  '$ CREATININE 0.82 0.80 0.86 0.93  CALCIUM 9.3  --  9.0 8.4*  MG  --   --  1.9  --     GFR: Estimated Creatinine Clearance: 45.7 mL/min (by C-G formula based on SCr of 0.93 mg/dL).  Liver Function Tests: Recent Labs  Lab 07/22/21 1632 07/23/21 0406  AST 25 21  ALT 12 11  ALKPHOS 82 70  BILITOT 0.8 1.0  PROT 7.0 6.3*  ALBUMIN 3.4* 3.0*    CBG: Recent Labs  Lab 07/22/21 1629  GLUCAP 120*     Recent Results (from the past 240 hour(s))  Resp Panel by RT-PCR (Flu A&B, Covid) Nasopharyngeal Swab     Status: None   Collection Time: 07/22/21  9:08 PM   Specimen: Nasopharyngeal Swab; Nasopharyngeal(NP) swabs in vial transport medium  Result Value Ref Range Status   SARS Coronavirus 2 by RT PCR NEGATIVE NEGATIVE Final    Comment: (NOTE) SARS-CoV-2 target nucleic acids  are NOT DETECTED.  The SARS-CoV-2 RNA is generally detectable in upper respiratory specimens during the acute phase of infection. The lowest concentration of SARS-CoV-2 viral copies this assay can detect is 138 copies/mL. A negative result does not preclude SARS-Cov-2 infection and should not be used as the sole basis for treatment or other patient management decisions. A negative result may occur with  improper specimen collection/handling, submission of specimen other than nasopharyngeal swab, presence of viral mutation(s) within the areas targeted by this assay, and inadequate number of viral copies(<138 copies/mL). A negative result must be combined with clinical observations, patient history, and epidemiological information. The expected result is Negative.  Fact Sheet for Patients:  EntrepreneurPulse.com.au  Fact Sheet for Healthcare Providers:  IncredibleEmployment.be  This test is no t yet approved or cleared by the [place] FDA and  has been authorized for detection and/or diagnosis of SARS-CoV-2 by FDA under an Emergency Use Authorization (EUA). This  EUA will remain  in effect (meaning this test can be used) for the duration of the COVID-19 declaration under Section 564(b)(1) of the Act, 21 U.S.C.section 360bbb-3(b)(1), unless the authorization is terminated  or revoked sooner.       Influenza A by PCR NEGATIVE NEGATIVE Final   Influenza B by PCR NEGATIVE NEGATIVE Final    Comment: (NOTE) The Xpert Xpress SARS-CoV-2/FLU/RSV plus assay is intended as an aid in the diagnosis of influenza from Nasopharyngeal swab specimens and should not be used as a sole basis for treatment. Nasal washings and aspirates are unacceptable for Xpert Xpress SARS-CoV-2/FLU/RSV testing.  Fact Sheet for Patients: EntrepreneurPulse.com.au  Fact Sheet for Healthcare Providers: IncredibleEmployment.be  This test is  not yet approved or cleared by the [place] FDA and has been authorized for detection and/or diagnosis of SARS-CoV-2 by FDA under an Emergency Use Authorization (EUA). This EUA will remain in effect (meaning this test can be used) for the duration of the COVID-19 declaration under Section 564(b)(1) of the Act, 21 U.S.C. section 360bbb-3(b)(1), unless the authorization is terminated or revoked.  Performed at [facility], [place] [address], [place], [place] [address]          Radiology Studies: No results found.      Scheduled Meds:  brimonidine  1 drop Both Eyes BID   dabigatran  150 mg Oral Q12H   dorzolamide-timolol  1 drop Both Eyes BID   latanoprost  1 drop Both Eyes QHS   melatonin  5 mg Oral QHS   QUEtiapine  50 mg Oral Q24H   rosuvastatin  10 mg Oral Daily   traZODone  50 mg Oral QHS   Continuous Infusions:  sodium chloride 75 mL/hr at 07/28/21 1055      LOS: 6 days       Phillips Climes, MD [facility]   To contact the attending provider between 7A-7P or the covering provider during after hours 7P-7A, please log into the web site [email or web address] and access using universal [place] password for that web site. If you do not have the password, please call the hospital operator.  07/28/2021, 11:34 AM

## 2021-07-28 NOTE — Progress Notes (Signed)
Physical Therapy Treatment Patient Details Name: Donald Hawkins MRN: [id number] DOB: 1928-12-05 Today's Date: 07/28/2021   History of Present Illness Pt is a 88 year old man admitted with R side weakness, dysarthria and R facial droop on 07/22/21. MRI+ multiple small foci infarcts in B cerebrum and cerebellum. 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    PT Comments    Patient sleeping on arrival, but easily awakened. Patient talking, but not making sense/confused. Able to follow multi-modal cues for transfer to sit EOB and practice sit to stand transfers x 4 reps with seated rest in between. Patient only achieved upright on last attempt and not safe to pivot into chair due to strong posterior lean. Returned to bed, in upright position, with caregiver/friend present and encouraged to try to talk to him and keep him awake.     Recommendations for follow up therapy are one component of a multi-disciplinary discharge planning process, led by the attending physician.  Recommendations may be updated based on patient status, additional functional criteria and insurance authorization.  Follow Up Recommendations  Acute inpatient rehab (3hours/day)     Assistance Recommended at Discharge Frequent or constant Supervision/Assistance  Patient can return home with the following A lot of help with bathing/dressing/bathroom;Two people to help with walking and/or transfers;Assist for transportation;Help with stairs or ramp for entrance   Equipment Recommendations  Rolling walker (2 wheels)    Recommendations for Other Services       Precautions / Restrictions Precautions Precautions: Fall Restrictions Weight Bearing Restrictions: No     Mobility  Bed Mobility Overal bed mobility: Needs Assistance Bed Mobility: Rolling, Sidelying to Sit, Sit to Supine Rolling: Max assist, +2 for physical assistance Sidelying to sit: Max assist, +2 for  physical assistance   Sit to supine: Max assist, +2 for physical assistance   General bed mobility comments: max A x2 and maximal multimodal cuing for initiation and sequencing of rolling and side to sit, Once in seated pt requires increased posterior support and maximal multimodal cuing for forward lean and balancing in sitting, .    Transfers Overall transfer level: Needs assistance Equipment used: 2 person hand held assist Transfers: Sit to/from Stand, Bed to chair/wheelchair/BSC Sit to Stand: Total assist, +2 physical assistance           General transfer comment: attempted x 2 with pt in hospital socks with pt achieving <1/2 standing and pushing posteriorly; family friend mentioned he had shoes in his closet and donned. Pt did better on 3rd and 4th attempts and able to achieve upright on 4th attempt x 5 seconds    Ambulation/Gait               General Gait Details: unsafe to attempt   Stairs             Wheelchair Mobility    Modified Rankin (Stroke Patients Only) Modified Rankin (Stroke Patients Only) Pre-Morbid Rankin Score: Moderate disability Modified Rankin: Severe disability     Balance Overall balance assessment: Needs assistance Sitting-balance support: Feet supported, Bilateral upper extremity supported Sitting balance-Leahy Scale: Poor Sitting balance - Comments: able to achieve sitting balance for short bouts of ~20 sec limited by increased posterior lean. Postural control: Posterior lean Standing balance support: Single extremity supported, Bilateral upper extremity supported, During functional activity Standing balance-Leahy Scale: Zero  Cognition Arousal/Alertness: Lethargic Behavior During Therapy: WFL for tasks assessed/performed Overall Cognitive Status: Impaired/Different from baseline Area of Impairment: Problem solving, Safety/judgement, Following commands, Orientation, Attention,  Awareness                 Orientation Level: Place, Time, Situation Current Attention Level: Focused   Following Commands: Follows one step commands with increased time, Follows one step commands inconsistently (and multimodal cues) Safety/Judgement: Decreased awareness of safety, Decreased awareness of deficits Awareness:  (pre-intellectual) Problem Solving: Slow processing, Requires verbal cues, Requires tactile cues, Decreased initiation, Difficulty sequencing General Comments: pt with decreased awareness of posterior lean especially with trying to come to upright, with increased time and multimodal cuing able to follow commands inconsistently        Exercises      General Comments        Pertinent Vitals/Pain Pain Assessment Pain Assessment: No/denies pain Faces Pain Scale: No hurt    Home Living                          Prior Function            PT Goals (current goals can now be found in the care plan section) Acute Rehab PT Goals Patient Stated Goal: to go to rehab Time For Goal Achievement: 08/06/21 Potential to Achieve Goals: Good Progress towards PT goals: Not progressing toward goals - comment (slight regression since last visit; pt confused)    Frequency    Min 4X/week      PT Plan Current plan remains appropriate    Co-evaluation              AM-PAC PT "6 Clicks" Mobility   Outcome Measure  Help needed turning from your back to your side while in a flat bed without using bedrails?: A Lot Help needed moving from lying on your back to sitting on the side of a flat bed without using bedrails?: Total Help needed moving to and from a bed to a chair (including a wheelchair)?: Total Help needed standing up from a chair using your arms (e.g., wheelchair or bedside chair)?: Total Help needed to walk in hospital room?: Total Help needed climbing 3-5 steps with a railing? : Total 6 Click Score: 7    End of Session Equipment  Utilized During Treatment: Gait belt Activity Tolerance: Patient limited by fatigue Patient left: with call bell/phone within reach;with family/visitor present;in bed;with bed alarm set Nurse Communication: Mobility status PT Visit Diagnosis: Unsteadiness on feet (R26.81);Muscle weakness (generalized) (M62.81);Hemiplegia and hemiparesis Hemiplegia - Right/Left: Right Hemiplegia - dominant/non-dominant: Dominant Hemiplegia - caused by: Cerebral infarction     Time: 4174-0814 PT Time Calculation (min) (ACUTE ONLY): 21 min  Charges:  $Gait Training: 8-22 mins                      Arby Barrette, PT [place]  Pager [phone] Office [phone]    Rexanne Mano 07/28/2021, 4:25 PM

## 2021-07-28 NOTE — Progress Notes (Signed)
Inpatient Rehab Admissions Coordinator:  Made aware by [facility] pt's family has decided to pursue SNF for rehab services. Also spoke with pt's son Nicki Reaper to confirm. AC will sign off.   Gayland Curry, [place], [place] Admissions Coordinator [phone]

## 2021-07-29 DIAGNOSIS — I639 Cerebral infarction, unspecified: Secondary | ICD-10-CM | POA: Diagnosis not present

## 2021-07-29 DIAGNOSIS — H409 Unspecified glaucoma: Secondary | ICD-10-CM | POA: Diagnosis not present

## 2021-07-29 DIAGNOSIS — E43 Unspecified severe protein-calorie malnutrition: Secondary | ICD-10-CM

## 2021-07-29 DIAGNOSIS — I10 Essential (primary) hypertension: Secondary | ICD-10-CM | POA: Diagnosis not present

## 2021-07-29 MED ORDER — MELATONIN 5 MG PO TABS: 5.0000 mg | ORAL_TABLET | Freq: Every evening | ORAL | 0 refills | Status: AC | PRN

## 2021-07-29 MED ORDER — ROSUVASTATIN CALCIUM 10 MG PO TABS: 10.0000 mg | ORAL_TABLET | Freq: Every day | ORAL | Status: AC

## 2021-07-29 MED ORDER — QUETIAPINE FUMARATE 50 MG PO TABS: 50.0000 mg | ORAL_TABLET | ORAL | Status: AC

## 2021-07-29 MED ORDER — DABIGATRAN ETEXILATE MESYLATE 150 MG PO CAPS: 150.0000 mg | ORAL_CAPSULE | Freq: Two times a day (BID) | ORAL | Status: AC

## 2021-07-29 MED ORDER — TRAZODONE HCL 50 MG PO TABS: 50.0000 mg | ORAL_TABLET | Freq: Every day | ORAL | Status: AC

## 2021-07-29 NOTE — Discharge Summary (Signed)
PATIENT DETAILS Name: Donald Hawkins Age: 88 y.o. Sex: male Date of Birth: [birth year] MRN: [id number]. Admitting Physician: Vernelle Emerald, MD FTD:DUKGUR, Samantha Crimes  Admit Date: 07/22/2021 Discharge date: 07/29/2021  Recommendations for Outpatient Follow-up:  Follow up with PCP in 1-2 weeks Please obtain CMP/CBC in one week Please ensure follow-up at the stroke clinic. Please ensure follow-up with oncology/radiation oncology  Admitted From:  Home  Disposition: Skilled nursing facility   Discharge Condition: good  CODE STATUS:   Code Status: Full Code   Diet recommendation:  Diet Order             Diet - low sodium heart healthy           DIET DYS 2 Room service appropriate? 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  Stroke was felt to be embolic in nature-he was evaluated by neurology-switch from Eliquis to Pradaxa.  Further hospital course was complicated by hospital delirium.  See below for further details.  Brief Hospital Course: Acute embolic CVA: Presented with slurred speech and RLE weakness-underwent further work-up.  Echo with preserved EF, LDL 74, A1c 5.5-CT head showed left VA origin stenosis, bilateral ICA bulb atherosclerosis-neurology recommending that we switch from Eliquis to Pradaxa given concern for Eliquis failure.  Evaluated by PT/OT with recommendations for SNF.  Tolerating dysphagia 2 diet well.  PAF: Continue amiodarone-on anticoagulation with Pradaxa.  Given concern that patient had Eliquis failure-has been switched to Pradaxa.  Hospital delirium: No major issues overnight-tolerating Seroquel and trazodone.  HTN: BP relatively stable-being monitored off antihypertensives.  History of metastatic  squamous cell carcinoma of the head/neck: S/p radiation therapy with Dr. Stanton Kidney outpatient follow-up with Dr. Alen Blew.  Recent CT chest/neck done by radiation oncology with enlarged bilateral axillary lymph nodes-we will defer further to the outpatient setting.  Glaucoma: Continue eyedrops as previous.  Nutrition Status: Nutrition Problem: Severe Malnutrition Etiology: chronic illness (squamous cell cancer) Signs/Symptoms: severe muscle depletion, severe fat depletion, percent weight loss (10% weight loss within 1 month) Percent weight loss: 10 % Interventions: Ensure Enlive (each supplement provides 350kcal and 20 grams of protein), MVI    BMI: Estimated body mass index is 19.77 kg/m as calculated from the following:   Height as of this encounter: '5\' 10"'$  (1.778 m).   Weight as of this encounter: 62.5 kg.   Discharge Diagnoses:  Principal Problem:   Acute cardioembolic stroke [facility]) Active Problems:   Paroxysmal atrial fibrillation (HCC)   Essential hypertension   Metastatic squamous cell carcinoma to head and neck (HCC)   Long term current use of anticoagulant therapy   Glaucoma   Acute embolic stroke (HCC)   Protein-calorie malnutrition, severe   Discharge Instructions:  Activity:  As tolerated   Discharge Instructions     Ambulatory referral to Neurology   Complete by: As directed    Follow up with stroke clinic NP (Jessica Vanschaick or Cecille Rubin, if both not available, consider Zachery Dauer, or Ahern) at [facility] in about 4 weeks. Thanks.   Diet - low sodium heart healthy   Complete by: As directed    Discharge instructions   Complete by: As directed    Follow with Primary MD  Lorrene Reid, PA-C in 1-2  weeks  Please get a complete blood count and chemistry panel checked by your Primary MD at your next visit, and again as instructed by your Primary MD.  Get Medicines reviewed and adjusted: Please take all your medications with you for your next visit  with your Primary MD  Laboratory/radiological data: Please request your Primary MD to go over all hospital tests and procedure/radiological results at the follow up, please ask your Primary MD to get all Hospital records sent to his/her office.  In some cases, they will be blood work, cultures and biopsy results pending at the time of your discharge. Please request that your primary care M.D. follows up on these results.  Also Note the following: If you experience worsening of your admission symptoms, develop shortness of breath, life threatening emergency, suicidal or homicidal thoughts you must seek medical attention immediately by calling 911 or calling your MD immediately  if symptoms less severe.  You must read complete instructions/literature along with all the possible adverse reactions/side effects for all the Medicines you take and that have been prescribed to you. Take any new Medicines after you have completely understood and accpet all the possible adverse reactions/side effects.   Do not drive when taking Pain medications or sleeping medications (Benzodaizepines)  Do not take more than prescribed Pain, Sleep and Anxiety Medications. It is not advisable to combine anxiety,sleep and pain medications without talking with your primary care practitioner  Special Instructions: If you have smoked or chewed Tobacco  in the last 2 yrs please stop smoking, stop any regular Alcohol  and or any Recreational drug use.  Wear Seat belts while driving.  Please note: You were cared for by a hospitalist during your hospital stay. Once you are discharged, your primary care physician will handle any further medical issues. Please note that NO REFILLS for any discharge medications will be authorized once you are discharged, as it is imperative that you return to your primary care physician (or establish a relationship with a primary care physician if you do not have one) for your post hospital discharge  needs so that they can reassess your need for medications and monitor your lab values.   Increase activity slowly   Complete by: As directed    No wound care   Complete by: As directed       Allergies as of 07/29/2021   No Known Allergies      Medication List     STOP taking these medications    apixaban 5 MG Tabs tablet Commonly known as: Eliquis   ibuprofen 200 MG tablet Commonly known as: ADVIL       TAKE these medications    acetaminophen 650 MG CR tablet Commonly known as: TYLENOL Take 1,300 mg by mouth daily as needed for pain.   brimonidine 0.2 % ophthalmic solution Commonly known as: ALPHAGAN INSTILL 1 DROP INTO BOTH  EYES TWO TIMES DAILY What changed: See the new instructions.   dabigatran 150 MG Caps capsule Commonly known as: PRADAXA Take 1 capsule (150 mg total) by mouth every 12 (twelve) hours.   dorzolamide-timolol 22.3-6.8 MG/ML ophthalmic solution Commonly known as: COSOPT Place 1 drop into both eyes in the morning and at bedtime.   latanoprost 0.005 % ophthalmic solution Commonly known as: XALATAN Place 1 drop into both eyes at bedtime.   melatonin 5 MG Tabs Take 1 tablet (5 mg total) by mouth at bedtime as needed.   polyethylene glycol 17 g packet Commonly known as:  MIRALAX / GLYCOLAX Take 17 g by mouth daily as needed (constipation).   polyvinyl alcohol 1.4 % ophthalmic solution Commonly known as: LIQUIFILM TEARS Place 1 drop into both eyes every 4 (four) hours as needed (for dryness).   QUEtiapine 50 MG tablet Commonly known as: SEROQUEL Take 1 tablet (50 mg total) by mouth daily.   rosuvastatin 10 MG tablet Commonly known as: CRESTOR Take 1 tablet (10 mg total) by mouth daily.   traZODone 50 MG tablet Commonly known as: DESYREL Take 1 tablet (50 mg total) by mouth at bedtime.        Follow-up Information     [facility]. Schedule an appointment as soon as possible for a visit in 1 month(s).    Specialty: Neurology Why: stroke clinic Contact information: [place] [phone]        Lorrene Reid, PA-C Follow up in 1 week(s).   Specialty: Physician Assistant Contact information: [place] [address] [phone]                No Known Allergies   Other Procedures/Studies: CT HEAD WO CONTRAST (5MM)  Result Date: 07/25/2021 CLINICAL DATA:  Stroke follow-up EXAM: CT HEAD WITHOUT CONTRAST TECHNIQUE: Contiguous axial images were obtained from the base of the skull through the vertex without intravenous contrast. RADIATION DOSE REDUCTION: This exam was performed according to the departmental dose-optimization program which includes automated exposure control, adjustment of the mA and/or kV according to patient size and/or use of iterative reconstruction technique. COMPARISON:  None Available. FINDINGS: Brain: There is no mass, hemorrhage or extra-axial collection. There is generalized atrophy without lobar predilection. Hypodensity of the white matter is most commonly associated with chronic microvascular disease. Vascular: No abnormal hyperdensity of the major intracranial arteries or dural venous sinuses. No intracranial atherosclerosis. Skull: The visualized skull base, calvarium and extracranial soft tissues are normal. Sinuses/Orbits: Left mastoid effusion.  The orbits are normal. IMPRESSION: 1. No acute intracranial abnormality. 2. Left mastoid effusion. 3. Hypodensity of the white matter is most commonly associated with chronic microvascular disease. Electronically Signed   By: Ulyses Jarred M.D.   On: 07/25/2021 03:03   CT Soft Tissue Neck W Contrast  Result Date: 07/14/2021 CLINICAL DATA:  Head neck cancer, assess treatment response. EXAM: CT NECK WITH CONTRAST TECHNIQUE: Multidetector CT imaging of the neck was performed using the standard protocol following the bolus administration of  intravenous contrast. RADIATION DOSE REDUCTION: This exam was performed according to the departmental dose-optimization program which includes automated exposure control, adjustment of the mA and/or kV according to patient size and/or use of iterative reconstruction technique. CONTRAST:  78m OMNIPAQUE IOHEXOL 300 MG/ML  SOLN COMPARISON:  01/20/2021 FINDINGS: Pharynx and larynx: No evidence of mass or inflammation. Salivary glands: Left parotidectomy with non masslike architectural distortion and low-density in the operative region. No mass or inflammation of the remaining salivary glands. Thyroid: Unremarkable Lymph nodes: Left neck dissection with non masslike architectural distortion and low-density. No enlarged or heterogeneous nodes Vascular: Atheromatous plaque most notable at the carotid bifurcations. Limited intracranial: Negative Visualized orbits: Limited coverage is negative for acute finding. Bilateral cataract resection Mastoids and visualized paranasal sinuses: Patchy sinus opacification at the level of the ethmoids. Low-density polyp in the posterior left nasal cavity extending into the nasopharynx, 2.4 cm in length. Skeleton: No acute finding Upper chest: No acute finding IMPRESSION: Post treatment left neck with no evidence of residual or  recurrent disease. Electronically Signed   By: Jorje Guild M.D.   On: 07/14/2021 07:35   CT Chest W Contrast  Result Date: 07/14/2021 CLINICAL DATA:  Head and neck cancer, left parotid neoplasm, status post parotidectomy and XRT, assess treatment response * Tracking Code: BO * EXAM: CT CHEST WITH CONTRAST TECHNIQUE: Multidetector CT imaging of the chest was performed during intravenous contrast administration. RADIATION DOSE REDUCTION: This exam was performed according to the departmental dose-optimization program which includes automated exposure control, adjustment of the mA and/or kV according to patient size and/or use of iterative reconstruction  technique. CONTRAST:  39m OMNIPAQUE IOHEXOL 300 MG/ML  SOLN COMPARISON:  PET-CT, 02/12/2021 FINDINGS: Cardiovascular: Aortic atherosclerosis. Aortic valve calcifications. Mild cardiomegaly. Left coronary artery calcifications. No pericardial effusion. Mediastinum/Nodes: Newly enlarged bilateral axillary lymph nodes, left greater than right, largest left axillary nodes measuring 2.0 x 1.2 cm (series 5, image 22). Slightly enlarged pretracheal and AP window lymph nodes, largest pretracheal node measuring 2.0 x 1.1 cm, previously 1.7 x 0.8 cm (series 5, image 31). Thyroid gland, trachea, and esophagus demonstrate no significant findings. Lungs/Pleura: Mild, diffuse bilateral bronchial wall thickening. No pleural effusion or pneumothorax. Upper Abdomen: No acute abnormality. Musculoskeletal: No chest wall abnormality. No suspicious osseous lesions identified. IMPRESSION: 1. Newly enlarged bilateral axillary lymph nodes, left greater than right. Slightly enlarged pretracheal and AP window lymph nodes. Findings are highly concerning for nodal metastatic disease. 2. Mild, diffuse bilateral bronchial wall thickening, consistent with nonspecific infectious or inflammatory bronchitis. 3. Coronary artery disease. 4. Aortic valve calcifications. Correlate for echocardiographic evidence of aortic valve dysfunction. Aortic Atherosclerosis (ICD10-I70.0). Electronically Signed   By: ADelanna AhmadiM.D.   On: 07/14/2021 10:58   MR BRAIN WO CONTRAST  Result Date: 07/22/2021 CLINICAL DATA:  Facial droop and slurred speech. EXAM: MRI HEAD WITHOUT CONTRAST TECHNIQUE: Multiplanar, multiecho pulse sequences of the brain and surrounding structures were obtained without intravenous contrast. COMPARISON:  Head CT and CT perfusion 07/22/2021 FINDINGS: Brain: Multiple small foci of acute ischemia within both cerebral and cerebellar hemispheres. Chronic blood products at old left occipital infarct. A few scattered foci of chronic  microhemorrhage. There is multifocal hyperintense T2-weighted signal within the white matter. Generalized cerebral volume loss. Multiple old small vessel infarcts of the corona radiata. The midline structures are normal. Vascular: Major flow voids are preserved. Skull and upper cervical spine: Normal calvarium and skull base. Visualized upper cervical spine and soft tissues are normal. Sinuses/Orbits:Left mastoid effusion.  Normal orbits. IMPRESSION: 1. Multiple small foci of acute ischemia within both cerebral and cerebellar hemispheres. The pattern is suggestive of emboli from a cardiac or proximal aortic source. 2. Multiple old small vessel infarcts and findings of chronic ischemic microangiopathy. Electronically Signed   By: KUlyses JarredM.D.   On: 07/22/2021 20:35   DG Swallowing Func-Speech Pathology  Result Date: 07/23/2021 Table formatting from the original result was not included. Objective Swallowing Evaluation: Type of Study: MBS-Modified Barium Swallow Study  Patient Details Name: GRomney CompeanMRN: [id number] of Birth: [birth year]Today's Date: 07/23/2021 Time: SLP Start Time (ACUTE ONLY): 141-SLP Stop Time (ACUTE ONLY): 1350 SLP Time Calculation (min) (ACUTE ONLY): 40 min Past Medical History: Past Medical History: Diagnosis Date  [place]   skin top of head - squamous and basal. "Squamous on left neck"  Depression   Hernia, inguinal   Hypertension  Past Surgical History: Past Surgical History: Procedure Laterality Date  ADJACENT TISSUE TRANSFER/TISSUE REARRANGEMENT  01/26/2021  Procedure: Tissue rearrangement  Left neck 60 sq cm.;  Surgeon: Melida Quitter, MD;  Location: [place];  Service: ENT;;  CATARACT EXTRACTION    EYE SURGERY    NASAL SEPTUM SURGERY    40 yrs ago  PAROTIDECTOMY Left 01/26/2021  Procedure: LEFT PAROTIDECTOMY;  Surgeon: Melida Quitter, MD;  Location: [place];  Service: ENT;  Laterality: Left;  RADICAL NECK DISSECTION Left 01/26/2021  Procedure: MODIFIED RADICAL NECK DISSECTION;   Surgeon: Melida Quitter, MD;  Location: [place];  Service: ENT;  Laterality: Left; HPI: 88 year old male with past medical history of left external ear squamous cell carcinoma of the left helix with subsequent development of metastatic disease to the left lateral neck diagnosed 12/2020 status post surgical resection (parotidectomy) and radiation therapy with chronic right facial droop, paroxysmal atrial fibrillation on Eliquis, hypertension who presents to [facility] emergency department with complaint of slurred speech. Per MRI of head with multiple small foci of acute ischemia within both cerebral and  cerebellar hemispheres and Multiple old small vessel infarcts and findings of chronic ischemic microangiopathy.  Subjective: alert, upright in chair for procedure  Recommendations for follow up therapy are one component of a multi-disciplinary discharge planning process, led by the attending physician.  Recommendations may be updated based on patient status, additional functional criteria and insurance authorization. Assessment / Plan / Recommendation   07/23/2021   2:00 PM Clinical Impressions Clinical Impression Pt presents with a mild oral dysphagia and moderate pharyngeal dysphagia likely of multifactorial etiology including prior head and neck cancer with XRT treatment, acute CVA, and pt deconditioning. Oral deficits included delayed bolus transit and mild oral residuals. Pharyngeal deficits c/b reduced timing and efficiency of laryngeal vestibule closure, reduced base of tongue retraction, decreased laryngeal elevation, reduced pharyngeal stripping wave, and unreliable laryngeal sensation. This allowed for pre swallow laryngeal penetration with thin liquids to the level of the vocal cords with post swallow aspiration (PAS-8) milder amounts. Pt was able to clear aspirates with cueing to cough and volitionally swallow. Directed for use of chin tuck however pt could not consistenly implement. With thicker  viscosities, pt with improved pre swallow airway protection, however post swallow residual spillage consistently occured with tracheal aspiration and increased pharyngeal residuals (nectar thick, honey thick, puree). Given that aspiration occured in smaller amounts across boluses (excluding small trial of mechanical soft), recommend dysphagia 3 (mechanical soft) thin liquids with strict adherence to safe swallow precautions. ( no straws, 1:1 supervision, cough/clear throat with all swallows, small sips and bites, swallow x2).  SLP to closely follow for tolerance, family and pt education. SLP Visit Diagnosis Dysphagia, oropharyngeal phase (R13.12) Impact on safety and function Moderate aspiration risk     07/23/2021   2:00 PM Treatment Recommendations Treatment Recommendations Therapy as outlined in treatment plan below     07/23/2021   2:00 PM Prognosis Prognosis for Safe Diet Advancement Good Barriers to Reach Goals Cognitive deficits;Time post onset   07/23/2021   2:00 PM Diet Recommendations SLP Diet Recommendations Thin liquid;Dysphagia 3 (Mech soft) solids Liquid Administration via Cup;No straw Medication Administration Crushed with puree Compensations Small sips/bites;Slow rate;Minimize environmental distractions;Clear throat intermittently;Hard cough after swallow;Effortful swallow;Multiple dry swallows after each bite/sip Postural Changes Seated upright at 90 degrees;Remain semi-upright after after feeds/meals (Comment)     07/23/2021   2:00 PM Other Recommendations Oral Care Recommendations Oral care BID Follow Up Recommendations Acute inpatient rehab (3hours/day) Assistance recommended at discharge Frequent or constant Supervision/Assistance Functional Status Assessment Patient has had a recent decline in their  functional status and demonstrates the ability to make significant improvements in function in a reasonable and predictable amount of time.   07/23/2021   2:00 PM Frequency and Duration  Speech Therapy  Frequency (ACUTE ONLY) min 2x/week Treatment Duration 2 weeks     07/23/2021   2:00 PM Oral Phase Oral Phase Impaired Oral - Honey Cup Weak lingual manipulation;Delayed oral transit Oral - Nectar Cup Delayed oral transit;Lingual/palatal residue Oral - Thin Cup Lingual/palatal residue;Delayed oral transit Oral - Thin Straw Delayed oral transit Oral - Puree Delayed oral transit;Lingual/palatal residue Oral - Mech Soft Lingual/palatal residue;Decreased bolus cohesion    07/23/2021   2:00 PM Pharyngeal Phase Pharyngeal Phase Impaired Pharyngeal- Honey Cup Reduced laryngeal elevation;Reduced tongue base retraction;Penetration/Apiration after swallow;Pharyngeal residue - valleculae;Pharyngeal residue - pyriform;Reduced epiglottic inversion;Reduced pharyngeal peristalsis Pharyngeal Material does not enter airway;Material enters airway, passes BELOW cords without attempt by patient to eject out (silent aspiration) Pharyngeal- Nectar Cup Reduced epiglottic inversion;Reduced tongue base retraction;Pharyngeal residue - pyriform;Pharyngeal residue - valleculae;Reduced pharyngeal peristalsis;Reduced laryngeal elevation;Penetration/Apiration after swallow Pharyngeal Material enters airway, CONTACTS cords and not ejected out;Material enters airway, passes BELOW cords without attempt by patient to eject out (silent aspiration) Pharyngeal- Thin Cup Penetration/Aspiration before swallow;Penetration/Apiration after swallow;Penetration/Aspiration during swallow;Reduced epiglottic inversion;Reduced tongue base retraction;Reduced pharyngeal peristalsis;Reduced airway/laryngeal closure;Pharyngeal residue - valleculae;Pharyngeal residue - pyriform Pharyngeal Material enters airway, CONTACTS cords and not ejected out;Material enters airway, passes BELOW cords without attempt by patient to eject out (silent aspiration) Pharyngeal- Thin Straw Penetration/Aspiration before swallow;Penetration/Aspiration during swallow;Pharyngeal residue -  pyriform;Pharyngeal residue - valleculae;Reduced epiglottic inversion;Reduced tongue base retraction;Reduced laryngeal elevation Pharyngeal Material enters airway, CONTACTS cords and not ejected out Pharyngeal- Puree Delayed swallow initiation-vallecula;Penetration/Apiration after swallow;Pharyngeal residue - valleculae;Pharyngeal residue - pyriform;Reduced epiglottic inversion;Reduced tongue base retraction;Reduced laryngeal elevation Pharyngeal Material enters airway, remains ABOVE vocal cords and not ejected out Pharyngeal- Mechanical Soft Pharyngeal residue - pyriform;Pharyngeal residue - valleculae;Reduced pharyngeal peristalsis;Reduced laryngeal elevation    07/23/2021   2:00 PM Cervical Esophageal Phase  Cervical Esophageal Phase [facility] Chelsea E Hartness MA, CCC-SLP 07/23/2021, 2:41 PM                     ECHOCARDIOGRAM COMPLETE BUBBLE STUDY  Result Date: 07/23/2021    ECHOCARDIOGRAM REPORT   Patient Name:   BERISH BOHMAN Date of Exam: 07/23/2021 Medical Rec #:  [id number]   Height:       70.0 in Accession #:    [id number]  Weight:       137.8 lb Date of Birth:  [birth year]   BSA:          1.782 m Patient Age:    31 years    BP:           143/95 mmHg Patient Gender: M           HR:           75 bpm. Exam Location:  Inpatient Procedure: 2D Echo, Saline Contrast Bubble Study, Cardiac Doppler and Color            Doppler Indications:    Stroke  History:        Patient has no prior history of Echocardiogram examinations.                 Abnormal ECG, Stroke, Arrythmias:Atrial Fibrillation; Risk                 Factors:Hypertension. Cancer.  Sonographer:    Roseanna Rainbow RDCS Referring Phys: [id number] KIRAN  J SHALHOUB  Sonographer Comments: Technically difficult study due to poor echo windows. VEry low parasternal window. IMPRESSIONS  1. Left ventricular ejection fraction, by estimation, is 60 to 65%. The left ventricle has normal function. The left ventricle has no regional wall motion abnormalities. There is mild left  ventricular hypertrophy. Left ventricular diastolic parameters are consistent with Grade I diastolic dysfunction (impaired relaxation).  2. Right ventricular systolic function is normal. The right ventricular size is normal. There is normal pulmonary artery systolic pressure.  3. Right atrial size was mildly dilated.  4. The mitral valve is normal in structure. No evidence of mitral valve regurgitation. No evidence of mitral stenosis.  5. Some nodular calcification of AV with shadowing in LVOT likely from calcium . The aortic valve is abnormal. There is moderate calcification of the aortic valve. There is moderate thickening of the aortic valve. Aortic valve regurgitation is trivial. Mild aortic valve stenosis.  6. The inferior vena cava is normal in size with greater than 50% respiratory variability, suggesting right atrial pressure of 3 mmHg.  7. Agitated saline contrast bubble study was negative, with no evidence of any interatrial shunt. FINDINGS  Left Ventricle: Left ventricular ejection fraction, by estimation, is 60 to 65%. The left ventricle has normal function. The left ventricle has no regional wall motion abnormalities. The left ventricular internal cavity size was normal in size. There is  mild left ventricular hypertrophy. Left ventricular diastolic parameters are consistent with Grade I diastolic dysfunction (impaired relaxation). Right Ventricle: The right ventricular size is normal. No increase in right ventricular wall thickness. Right ventricular systolic function is normal. There is normal pulmonary artery systolic pressure. The tricuspid regurgitant velocity is 2.50 m/s, and  with an assumed right atrial pressure of 8 mmHg, the estimated right ventricular systolic pressure is 40.9 mmHg. Left Atrium: Left atrial size was normal in size. Right Atrium: Right atrial size was mildly dilated. Pericardium: There is no evidence of pericardial effusion. Mitral Valve: The mitral valve is normal in  structure. No evidence of mitral valve regurgitation. No evidence of mitral valve stenosis. MV peak gradient, 10.4 mmHg. The mean mitral valve gradient is 5.0 mmHg. Tricuspid Valve: The tricuspid valve is normal in structure. Tricuspid valve regurgitation is mild . No evidence of tricuspid stenosis. Aortic Valve: Some nodular calcification of AV with shadowing in LVOT likely from calcium. The aortic valve is abnormal. There is moderate calcification of the aortic valve. There is moderate thickening of the aortic valve. Aortic valve regurgitation is trivial. Mild aortic stenosis is present. Aortic valve mean gradient measures 9.0 mmHg. Aortic valve peak gradient measures 18.4 mmHg. Aortic valve area, by VTI measures 2.70 cm. Pulmonic Valve: The pulmonic valve was normal in structure. Pulmonic valve regurgitation is not visualized. No evidence of pulmonic stenosis. Aorta: The aortic root is normal in size and structure. Venous: The inferior vena cava is normal in size with greater than 50% respiratory variability, suggesting right atrial pressure of 3 mmHg. IAS/Shunts: No atrial level shunt detected by color flow Doppler. Agitated saline contrast was given intravenously to evaluate for intracardiac shunting. Agitated saline contrast bubble study was negative, with no evidence of any interatrial shunt.  LEFT VENTRICLE PLAX 2D LVIDd:         4.30 cm LVIDs:         2.80 cm LV PW:         1.30 cm LV IVS:        1.20 cm LVOT diam:  2.60 cm LV SV:         109 LV SV Index:   61 LVOT Area:     5.31 cm  LV Volumes (MOD) LV vol d, MOD A2C: 74.7 ml LV vol d, MOD A4C: 79.3 ml LV vol s, MOD A2C: 30.2 ml LV vol s, MOD A4C: 25.6 ml LV SV MOD A2C:     44.5 ml LV SV MOD A4C:     79.3 ml LV SV MOD BP:      50.9 ml RIGHT VENTRICLE            IVC RV S prime:     9.23 cm/s  IVC diam: 2.40 cm TAPSE (M-mode): 1.4 cm LEFT ATRIUM           Index        RIGHT ATRIUM           Index LA diam:      3.30 cm 1.85 cm/m   RA Area:     23.80 cm  LA Vol (A2C): 32.9 ml 18.47 ml/m  RA Volume:   64.00 ml  35.92 ml/m LA Vol (A4C): 70.5 ml 39.57 ml/m  AORTIC VALVE AV Area (Vmax):    2.75 cm AV Area (Vmean):   3.10 cm AV Area (VTI):     2.70 cm AV Vmax:           214.50 cm/s AV Vmean:          137.750 cm/s AV VTI:            0.403 m AV Peak Grad:      18.4 mmHg AV Mean Grad:      9.0 mmHg LVOT Vmax:         111.00 cm/s LVOT Vmean:        80.500 cm/s LVOT VTI:          0.205 m LVOT/AV VTI ratio: 0.51  AORTA Ao Root diam: 3.20 cm MITRAL VALVE               TRICUSPID VALVE MV Area (PHT): 2.42 cm    TR Peak grad:   25.0 mmHg MV Area VTI:   5.21 cm    TR Vmax:        250.00 cm/s MV Peak grad:  10.4 mmHg MV Mean grad:  5.0 mmHg    SHUNTS MV Vmax:       1.61 m/s    Systemic VTI:  0.20 m MV Vmean:      94.8 cm/s   Systemic Diam: 2.60 cm MV Decel Time: 313 msec MV E velocity: 76.70 cm/s Jenkins Rouge MD Electronically signed by Jenkins Rouge MD Signature Date/Time: 07/23/2021/10:25:04 AM    Final    CT HEAD CODE STROKE WO CONTRAST  Result Date: 07/22/2021 CLINICAL DATA:  Code stroke.  Right-sided facial droop EXAM: CT HEAD WITHOUT CONTRAST TECHNIQUE: Contiguous axial images were obtained from the base of the skull through the vertex without intravenous contrast. RADIATION DOSE REDUCTION: This exam was performed according to the departmental dose-optimization program which includes automated exposure control, adjustment of the mA and/or kV according to patient size and/or use of iterative reconstruction technique. COMPARISON:  None Available. FINDINGS: Brain: There is no mass, hemorrhage or extra-axial collection. The size and configuration of the ventricles and extra-axial CSF spaces are normal. There is an age indeterminate left occipital infarct favored to be late subacute to chronic. No visible infarct within the left MCA territory. Vascular: No abnormal hyperdensity  of the major intracranial arteries or dural venous sinuses. No intracranial atherosclerosis.  Skull: The visualized skull base, calvarium and extracranial soft tissues are normal. Sinuses/Orbits: No fluid levels or advanced mucosal thickening of the visualized paranasal sinuses. No mastoid or middle ear effusion. The orbits are normal. ASPECTS [facility] Stroke Program Early CT Score) - Ganglionic level infarction (caudate, lentiform nuclei, internal capsule, insula, M1-M3 cortex): 7 - Supraganglionic infarction (M4-M6 cortex): 3 Total score (0-10 with 10 being normal): 10 IMPRESSION: 1. No acute hemorrhage. 2. Age indeterminate left occipital infarct, favored to be late subacute to chronic. 3. ASPECTS is 10. These results were called by telephone at the time of interpretation on 07/22/2021 at 7:41 pm to provider [facility] , who verbally acknowledged these results. Electronically Signed   By: Ulyses Jarred M.D.   On: 07/22/2021 19:42   CT ANGIO HEAD NECK W WO CM W PERF (CODE STROKE)  Addendum Date: 07/22/2021   ADDENDUM REPORT: 07/22/2021 20:36 ADDENDUM: Correction to findings above: There is bilateral carotid bifurcation atherosclerosis without hemodynamically significant stenosis. Electronically Signed   By: Ulyses Jarred M.D.   On: 07/22/2021 20:36   Result Date: 07/22/2021 CLINICAL DATA:  Right facial droop EXAM: CT ANGIOGRAPHY HEAD AND NECK CT PERFUSION BRAIN TECHNIQUE: Multidetector CT imaging of the head and neck was performed using the standard protocol during bolus administration of intravenous contrast. Multiplanar CT image reconstructions and MIPs were obtained to evaluate the vascular anatomy. Carotid stenosis measurements (when applicable) are obtained utilizing NASCET criteria, using the distal internal carotid diameter as the denominator. Multiphase CT imaging of the brain was performed following IV bolus contrast injection. Subsequent parametric perfusion maps were calculated using RAPID software. RADIATION DOSE REDUCTION: This exam was performed according to the departmental  dose-optimization program which includes automated exposure control, adjustment of the mA and/or kV according to patient size and/or use of iterative reconstruction technique. CONTRAST:  164m OMNIPAQUE IOHEXOL 350 MG/ML SOLN COMPARISON:  Head CT 07/22/2021 FINDINGS: CTA NECK FINDINGS SKELETON: There is no bony spinal canal stenosis. No lytic or blastic lesion. OTHER NECK: Normal pharynx, larynx and major salivary glands. No cervical lymphadenopathy. Unremarkable thyroid gland. UPPER CHEST: No pneumothorax or pleural effusion. No nodules or masses. AORTIC ARCH: There is no calcific atherosclerosis of the aortic arch. There is no aneurysm, dissection or hemodynamically significant stenosis of the visualized portion of the aorta. Conventional 3 vessel aortic branching pattern. The visualized proximal subclavian arteries are widely patent. RIGHT CAROTID SYSTEM: Normal without aneurysm, dissection or stenosis. LEFT CAROTID SYSTEM: Normal without aneurysm, dissection or stenosis. VERTEBRAL ARTERIES: Right dominant configuration. Moderate stenosis of the right vertebral artery origin. Left origin is normal. Both vertebral arteries are otherwise normal to the skull base. CTA HEAD FINDINGS There is a left mastoid effusion. POSTERIOR CIRCULATION: --Vertebral arteries: Normal V4 segments. --Inferior cerebellar arteries: Normal. --Basilar artery: Normal. --Superior cerebellar arteries: Normal. --Posterior cerebral arteries (PCA): Normal. ANTERIOR CIRCULATION: --Intracranial internal carotid arteries: Normal. --Anterior cerebral arteries (ACA): Normal. Both A1 segments are present. Patent anterior communicating artery (a-comm). --Middle cerebral arteries (MCA): Normal. VENOUS SINUSES: As permitted by contrast timing, patent. ANATOMIC VARIANTS: Fetal origin of the right posterior cerebral artery. Review of the MIP images confirms the above findings. CT Brain Perfusion Findings: ASPECTS: 10 CBF (<30%) Volume: 02mPerfusion  (Tmax>6.0s) volume: 13m57mismatch Volume: 13mL64mfarction Location:No core infarct. The calculated region of ischemia is located in the left frontal operculum. IMPRESSION: 1. No emergent large vessel occlusion or high-grade stenosis of the  intracranial arteries. 2. No core infarct. 9 mL area of ischemic penumbra within the left frontal operculum. 3. Moderate stenosis of the right vertebral artery origin. Electronically Signed: By: Ulyses Jarred M.D. On: 07/22/2021 20:04     TODAY-DAY OF DISCHARGE:  Subjective:   Laquon Emel today was sleeping comfortably when I first saw the patient.  He is awake and alert.  Objective:   Blood pressure 104/63, pulse 65, temperature (!) 96.5 F (35.8 C), temperature source Axillary, resp. rate 18, height '5\' 10"'$  (1.778 m), weight 62.5 kg, SpO2 99 %.  Intake/Output Summary (Last 24 hours) at 07/29/2021 0928 Last data filed at 07/29/2021 0500 Gross per 24 hour  Intake 2984.29 ml  Output 800 ml  Net 2184.29 ml   Filed Weights   07/23/21 0100  Weight: 62.5 kg    Exam: Awake Alert, Oriented *3, No new F.N deficits, Normal affect Andrews.AT,PERRAL Supple Neck,No JVD, No cervical lymphadenopathy appriciated.  Symmetrical Chest wall movement, Good air movement bilaterally, CTAB RRR,No Gallops,Rubs or new Murmurs, No Parasternal Heave +ve B.Sounds, Abd Soft, Non tender, No organomegaly appriciated, No rebound -guarding or rigidity. No Cyanosis, Clubbing or edema, No new Rash or bruise   PERTINENT RADIOLOGIC STUDIES: No results found.   PERTINENT LAB RESULTS: CBC: Recent Labs    07/27/21 0128  WBC 9.3  HGB 12.9*  HCT 36.3*  PLT 129*   CMET CMP     Component Value Date/Time   NA 137 07/27/2021 0128   NA 139 06/10/2021 1615   K 3.7 07/27/2021 0128   CL 110 07/27/2021 0128   CO2 23 07/27/2021 0128   GLUCOSE 136 (H) 07/27/2021 0128   BUN 13 07/27/2021 0128   BUN 16 06/10/2021 1615   CREATININE 0.93 07/27/2021 0128   CALCIUM 8.4 (L) 07/27/2021  0128   PROT 6.3 (L) 07/23/2021 0406   PROT 7.2 06/10/2021 1615   ALBUMIN 3.0 (L) 07/23/2021 0406   ALBUMIN 3.9 06/10/2021 1615   AST 21 07/23/2021 0406   ALT 11 07/23/2021 0406   ALKPHOS 70 07/23/2021 0406   BILITOT 1.0 07/23/2021 0406   BILITOT 0.7 06/10/2021 1615   GFRNONAA >60 07/27/2021 0128    GFR Estimated Creatinine Clearance: 45.7 mL/min (by C-G formula based on SCr of 0.93 mg/dL). No results for input(s): LIPASE, AMYLASE in the last 72 hours. No results for input(s): CKTOTAL, CKMB, CKMBINDEX, TROPONINI in the last 72 hours. Invalid input(s): POCBNP No results for input(s): DDIMER in the last 72 hours. No results for input(s): HGBA1C in the last 72 hours. No results for input(s): CHOL, HDL, LDLCALC, TRIG, CHOLHDL, LDLDIRECT in the last 72 hours. No results for input(s): TSH, T4TOTAL, T3FREE, THYROIDAB in the last 72 hours.  Invalid input(s): FREET3 No results for input(s): VITAMINB12, FOLATE, FERRITIN, TIBC, IRON, RETICCTPCT in the last 72 hours. Coags: No results for input(s): INR in the last 72 hours.  Invalid input(s): PT Microbiology: Recent Results (from the past 240 hour(s))  Resp Panel by RT-PCR (Flu A&B, Covid) Nasopharyngeal Swab     Status: None   Collection Time: 07/22/21  9:08 PM   Specimen: Nasopharyngeal Swab; Nasopharyngeal(NP) swabs in vial transport medium  Result Value Ref Range Status   SARS Coronavirus 2 by RT PCR NEGATIVE NEGATIVE Final    Comment: (NOTE) SARS-CoV-2 target nucleic acids are NOT DETECTED.  The SARS-CoV-2 RNA is generally detectable in upper respiratory specimens during the acute phase of infection. The lowest concentration of SARS-CoV-2 viral copies this assay can detect  is 138 copies/mL. A negative result does not preclude SARS-Cov-2 infection and should not be used as the sole basis for treatment or other patient management decisions. A negative result may occur with  improper specimen collection/handling, submission of  specimen other than nasopharyngeal swab, presence of viral mutation(s) within the areas targeted by this assay, and inadequate number of viral copies(<138 copies/mL). A negative result must be combined with clinical observations, patient history, and epidemiological information. The expected result is Negative.  Fact Sheet for Patients:  EntrepreneurPulse.com.au  Fact Sheet for Healthcare Providers:  IncredibleEmployment.be  This test is no t yet approved or cleared by the [place] FDA and  has been authorized for detection and/or diagnosis of SARS-CoV-2 by FDA under an Emergency Use Authorization (EUA). This EUA will remain  in effect (meaning this test can be used) for the duration of the COVID-19 declaration under Section 564(b)(1) of the Act, 21 U.S.C.section 360bbb-3(b)(1), unless the authorization is terminated  or revoked sooner.       Influenza A by PCR NEGATIVE NEGATIVE Final   Influenza B by PCR NEGATIVE NEGATIVE Final    Comment: (NOTE) The Xpert Xpress SARS-CoV-2/FLU/RSV plus assay is intended as an aid in the diagnosis of influenza from Nasopharyngeal swab specimens and should not be used as a sole basis for treatment. Nasal washings and aspirates are unacceptable for Xpert Xpress SARS-CoV-2/FLU/RSV testing.  Fact Sheet for Patients: EntrepreneurPulse.com.au  Fact Sheet for Healthcare Providers: IncredibleEmployment.be  This test is not yet approved or cleared by the [place] FDA and has been authorized for detection and/or diagnosis of SARS-CoV-2 by FDA under an Emergency Use Authorization (EUA). This EUA will remain in effect (meaning this test can be used) for the duration of the COVID-19 declaration under Section 564(b)(1) of the Act, 21 U.S.C. section 360bbb-3(b)(1), unless the authorization is terminated or revoked.  Performed at [facility], [place] [address],  [place], [place] [address]     FURTHER DISCHARGE INSTRUCTIONS:  Get Medicines reviewed and adjusted: Please take all your medications with you for your next visit with your Primary MD  Laboratory/radiological data: Please request your Primary MD to go over all hospital tests and procedure/radiological results at the follow up, please ask your Primary MD to get all Hospital records sent to his/her office.  In some cases, they will be blood work, cultures and biopsy results pending at the time of your discharge. Please request that your primary care M.D. goes through all the records of your hospital data and follows up on these results.  Also Note the following: If you experience worsening of your admission symptoms, develop shortness of breath, life threatening emergency, suicidal or homicidal thoughts you must seek medical attention immediately by calling 911 or calling your MD immediately  if symptoms less severe.  You must read complete instructions/literature along with all the possible adverse reactions/side effects for all the Medicines you take and that have been prescribed to you. Take any new Medicines after you have completely understood and accpet all the possible adverse reactions/side effects.   Do not drive when taking Pain medications or sleeping medications (Benzodaizepines)  Do not take more than prescribed Pain, Sleep and Anxiety Medications. It is not advisable to combine anxiety,sleep and pain medications without talking with your primary care practitioner  Special Instructions: If you have smoked or chewed Tobacco  in the last 2 yrs please stop smoking, stop any regular Alcohol  and or any Recreational drug use.  Wear  Seat belts while driving.  Please note: You were cared for by a hospitalist during your hospital stay. Once you are discharged, your primary care physician will handle any further medical issues. Please note that NO REFILLS for any discharge medications will  be authorized once you are discharged, as it is imperative that you return to your primary care physician (or establish a relationship with a primary care physician if you do not have one) for your post hospital discharge needs so that they can reassess your need for medications and monitor your lab values.  Total Time spent coordinating discharge including counseling, education and face to face time equals greater than 30 minutes.  SignedOren Binet 07/29/2021 9:28 AM

## 2021-07-29 NOTE — TOC Progression Note (Signed)
Transition of Care [facility]) - Progression Note    Patient Details  Name: Donald Hawkins MRN: [id number] Date of Birth: November 12, 1928  Transition of [facility]) CM/SW [place], LCSW Phone Number: 07/29/2021, 9:03 AM  Clinical Narrative:    Insurance approval received for [employer]: Ref# [id number] ID# [id number], effective 07/29/2021-07/31/2021. [facility] has a private room available today if patient is ready for discharge.      Barriers to Discharge: Barriers Resolved  Expected Discharge Plan and Services   In-house Referral: Clinical Social Work     Living arrangements for the past 2 months: Single Family Home                                       Social Determinants of Health (SDOH) Interventions    Readmission Risk Interventions     View : No data to display.

## 2021-07-29 NOTE — Progress Notes (Signed)
   07/29/21 1532  AVS Discharge Documentation  AVS Discharge Instructions Including Medications Provided to patient/caregiver  Name of Person Receiving AVS Discharge Instructions Including Medications Pleas Patricia, RN at [place] [phone]  Name of Clinician That Reviewed AVS Discharge Instructions Including Medications Venida Jarvis, RN

## 2021-07-29 NOTE — Progress Notes (Signed)
Occupational Therapy Treatment Patient Details Name: Donald Hawkins MRN: [id number] DOB: 07-03-1928 Today's Date: 07/29/2021   History of present illness Pt is a 88 year old man admitted with R side weakness, dysarthria and R facial droop on 07/22/21. MRI+ multiple small foci infarcts in B cerebrum and cerebellum. 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   OT comments  Pt with lethargy, likely medicine contributing,  but became more alert by end of session. Focus of session on bed mobility, sitting balance, sit to stand and standing balance with hands placed on walker. All mobility requiring +2 assist. Total assist for grooming and feeding. Updated d/c disposition to SNF.   Recommendations for follow up therapy are one component of a multi-disciplinary discharge planning process, led by the attending physician.  Recommendations may be updated based on patient status, additional functional criteria and insurance authorization.    Follow Up Recommendations  Skilled nursing-short term rehab (<3 hours/day)    Assistance Recommended at Discharge Frequent or constant Supervision/Assistance  Patient can return home with the following  Two people to help with walking and/or transfers;A lot of help with bathing/dressing/bathroom;Assistance with cooking/housework;Assistance with feeding;Direct supervision/assist for medications management;Direct supervision/assist for financial management;Assist for transportation;Help with stairs or ramp for entrance   Equipment Recommendations  Other (comment) (defer to next venue)    Recommendations for Other Services      Precautions / Restrictions Precautions Precautions: Fall Restrictions Weight Bearing Restrictions: No       Mobility Bed Mobility Overal bed mobility: Needs Assistance Bed Mobility: Rolling, Supine to Sit, Sit to Sidelying Rolling: Max assist, +2 for physical assistance Sidelying  to sit: +2 for physical assistance, Mod assist     Sit to sidelying: +2 for physical assistance, Mod assist General bed mobility comments: pt with lethargy at beginning of session, needing assist for LEs over EOB and to raise trunk, guided shoulders to pillow and assisted LEs back into bed, rolled onto R side for pressure relief    Transfers Overall transfer level: Needs assistance Equipment used: Rolling walker (2 wheels) Transfers: Sit to/from Stand Sit to Stand: +2 physical assistance, Max assist, From elevated surface           General transfer comment: hands placed on RW, assist of bed pad and gait belt to stand from elevated bed, posterior and R side bias, flexed hips with pt stabilizing back of legs on bed     Balance Overall balance assessment: Needs assistance Sitting-balance support: Feet supported, Bilateral upper extremity supported Sitting balance-Leahy Scale: Poor Sitting balance - Comments: progressed from posterior lean to being able to sit with min guard assist, tendency to lean R, when brought to his attention he was able to return to midline   Standing balance support: Bilateral upper extremity supported Standing balance-Leahy Scale: Zero Standing balance comment: unable to achieve fully upright                           ADL either performed or assessed with clinical judgement   ADL Overall ADL's : Needs assistance/impaired Eating/Feeding: Total assistance;Sitting;Bed level Eating/Feeding Details (indicate cue type and reason): sips of juice with spoon, cues to clear throat Grooming: Wash/dry face;Bed level Grooming Details (indicate cue type and reason): attempted hand over hand             Lower Body Dressing: Total assistance;Sitting/lateral leans Lower Body Dressing Details (indicate  cue type and reason): bedroom shoes                    Extremity/Trunk Assessment Upper Extremity Assessment RUE Deficits / Details: R UE falls  off walker when not attending RUE Sensation: decreased proprioception            Vision       Perception     Praxis      Cognition Arousal/Alertness: Lethargic, Suspect due to medications Behavior During Therapy: Flat affect (smiling by end of session) Overall Cognitive Status: Impaired/Different from baseline Area of Impairment: Following commands, Safety/judgement, Attention, Problem solving                   Current Attention Level: Focused   Following Commands: Follows one step commands with increased time, Follows one step commands inconsistently Safety/Judgement: Decreased awareness of safety, Decreased awareness of deficits   Problem Solving: Slow processing, Requires verbal cues, Requires tactile cues, Decreased initiation, Difficulty sequencing General Comments: pt received medication to help him rest last night        Exercises      Shoulder Instructions       General Comments      Pertinent Vitals/ Pain       Pain Assessment Pain Assessment: Faces Faces Pain Scale: No hurt  Home Living                                          Prior Functioning/Environment              Frequency  Min 3X/week        Progress Toward Goals  OT Goals(current goals can now be found in the care plan section)  Progress towards OT goals: Not progressing toward goals - comment (lethargy)  Acute Rehab OT Goals OT Goal Formulation: With patient Time For Goal Achievement: 08/06/21 Potential to Achieve Goals: [place]  Plan Discharge plan needs to be updated    Co-evaluation    PT/OT/SLP Co-Evaluation/Treatment: Yes Reason for Co-Treatment: For patient/therapist safety   OT goals addressed during session: ADL's and self-care;Strengthening/ROM      AM-PAC OT "6 Clicks" Daily Activity     Outcome Measure   Help from another person eating meals?: Total Help from another person taking care of personal grooming?: Total Help from  another person toileting, which includes using toliet, bedpan, or urinal?: Total Help from another person bathing (including washing, rinsing, drying)?: Total Help from another person to put on and taking off regular upper body clothing?: Total Help from another person to put on and taking off regular lower body clothing?: Total 6 Click Score: 6    End of Session Equipment Utilized During Treatment: Gait belt;Rolling walker (2 wheels)  OT Visit Diagnosis: Unsteadiness on feet (R26.81);Other abnormalities of gait and mobility (R26.89);Hemiplegia and hemiparesis;Muscle weakness (generalized) (M62.81);Other symptoms and signs involving cognitive function   Activity Tolerance Patient limited by lethargy   Patient Left in bed;with call bell/phone within reach;with bed alarm set;with family/visitor present   Nurse Communication          Time: 2800-3491 OT Time Calculation (min): 40 min  Charges: OT General Charges $OT Visit: 1 Visit OT Treatments $Neuromuscular Re-education: 8-22 mins  Nestor Lewandowsky, OTR/L Acute Rehabilitation Services Pager: [phone] Office: [phone]   Malka So 07/29/2021, 9:46 AM

## 2021-07-29 NOTE — TOC Transition Note (Signed)
Transition of Care [facility]) - CM/SW Discharge Note   Patient Details  Name: Donald Hawkins MRN: [id number] Date of Birth: March 21, 1928  Transition of [facility] CM/SW Contact:  Benard Halsted, LCSW Phone Number: 07/29/2021, 10:18 AM   Clinical Narrative:    Patient will DC to: [facility] Anticipated DC date: 07/29/21 Family notified: Lyndal Rainbow at bedside Transport by: Corey Harold   Per MD patient ready for DC to [facility]. RN to call report prior to discharge [phone] room 203). RN, patient, patient's family, and facility notified of DC. Discharge Summary and FL2 sent to facility. DC packet on chart. Ambulance transport requested for patient.   CSW will sign off for now as social work intervention is no longer needed. Please consult [place] again if new needs arise.     Final next level of care: Skilled Nursing Facility Barriers to Discharge: Barriers Resolved   Patient Goals and CMS Choice Patient states their goals for this hospitalization and ongoing recovery are:: Rehab CMS Medicare.gov Compare Post Acute Care list provided to:: Patient Represenative (must comment) Choice offered to / list presented to : Adult Children  Discharge Placement   Existing PASRR number confirmed : 07/29/21          Patient chooses bed at: [facility] Patient to be transferred to facility by: [facility] [facility] Name of family member notified: Scott Patient and family notified of of transfer: 07/29/21  Discharge Plan and Services In-house Referral: Clinical Social Work                                   Social Determinants of Health (SDOH) Interventions     Readmission Risk Interventions     View : No data to display.

## 2021-07-29 NOTE — Progress Notes (Addendum)
Speech Language Pathology Treatment: Dysphagia  Patient Details Name: Donald Hawkins MRN: [id number] DOB: 24-Dec-1928 Today's Date: 07/29/2021 Time: 9150-5697 SLP Time Calculation (min) (ACUTE ONLY): 11 min  Assessment / Plan / Recommendation Clinical Impression  Encountered pt with caregiver drinking thin liquids via teaspoon as recommended. Caregiver is appropriately following recommendations of throat clearing after swallows, tsp sips and sitting upright etc. Pt with intermittent s/s aspiration as expected with history of radiation. He immediately coughed one 2 of 4 sips not volitional or related to recommended strategy. Did not observe with solids at this time but discussed food texture as he is leaving for [facility] today. This morning he took puree applesauce and soft solids (banana). Pt does not want to downgrade at this time and is in agreement to continuing Dys 2 (fine chopped), thin and ST at SNF can upgrade as appropriate. Reviewed pt's ongoing aspiration risk with history of parotid gland radiation and present stroke. D/C ST from acute care.   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 Per MRI of head with multiple small foci of acute ischemia within both cerebral and  cerebellar hemispheres and Multiple old small vessel infarcts and findings of chronic ischemic microangiopathy; MBS completed with mild oral dysphagia and moderate pharyngeal dysphagia; placed on D3/thin liquid diet with strict swallowing precautions in place.  ST f/u for speech/language and dysphagia therapy.      SLP Plan   All goals met;Discharge SLP treatment due to (comment)      Recommendations for follow up therapy are one component of a multi-disciplinary discharge planning process, led by the attending physician.  Recommendations may be updated based on patient status, additional functional criteria and insurance authorization.    Recommendations  Diet recommendations: Dysphagia 2 (fine chop);Thin liquid Liquids provided via: Teaspoon Medication Administration: Crushed with puree Supervision: Full supervision/cueing for compensatory strategies;Staff to assist with self feeding Compensations: Slow rate;Small sips/bites;Multiple dry swallows after each bite/sip;Other (Comment) (cough/throat clear intermittently) Postural Changes and/or Swallow Maneuvers: Seated upright 90 degrees                Oral Care Recommendations: Oral care BID;Staff/trained caregiver to provide oral care Follow Up Recommendations: Skilled nursing-short term rehab (<3 hours/day) Assistance recommended at discharge: Frequent or constant Supervision/Assistance SLP Visit Diagnosis: Dysphagia, oropharyngeal phase (R13.12) Plan: All goals met;Discharge SLP treatment due to (comment)           Houston Siren  07/29/2021, 11:08 AM

## 2021-07-29 NOTE — Progress Notes (Signed)
Physical Therapy Treatment Patient Details Name: Donald Hawkins MRN: [id number] DOB: [birth year] Today's Date: 07/29/2021   History of Present Illness Pt is a 88 year old man admitted with R side weakness, dysarthria and R facial droop on 07/22/21. MRI+ multiple small foci infarcts in B cerebrum and cerebellum. 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    PT Comments    Patient sleeping on arrival with son reporting he was given something last night to help him sleep. He was awakened and had some difficulty staying awake until he was seated at EOB and became more alert. Able to work on standing x 4 reps with continued posterior bias and legs never fully extend. Patient was able to hold conversation and answers were making sense.     Recommendations for follow up therapy are one component of a multi-disciplinary discharge planning process, led by the attending physician.  Recommendations may be updated based on patient status, additional functional criteria and insurance authorization.  Follow Up Recommendations  Skilled nursing-short term rehab (<3 hours/day)     Assistance Recommended at Discharge Frequent or constant Supervision/Assistance  Patient can return home with the following A lot of help with bathing/dressing/bathroom;Two people to help with walking and/or transfers;Assist for transportation;Help with stairs or ramp for entrance   Equipment Recommendations  Rolling walker (2 wheels)    Recommendations for Other Services       Precautions / Restrictions Precautions Precautions: Fall Restrictions Weight Bearing Restrictions: No     Mobility  Bed Mobility Overal bed mobility: Needs Assistance Bed Mobility: Rolling, Supine to Sit, Sit to Sidelying Rolling: Max assist, +2 for physical assistance Sidelying to sit: +2 for physical assistance, Mod assist     Sit to sidelying: +2 for physical assistance, Mod  assist General bed mobility comments: pt with lethargy at beginning of session, needing assist for LEs over EOB and to raise trunk, guided shoulders to pillow and assisted LEs back into bed, rolled onto R side for pressure relief    Transfers Overall transfer level: Needs assistance Equipment used: Rolling walker (2 wheels) Transfers: Sit to/from Stand Sit to Stand: +2 physical assistance, Max assist, From elevated surface           General transfer comment: hands placed on RW, assist of bed pad and gait belt to stand from elevated bed, posterior and R side bias, flexed hips with pt stabilizing back of legs on bed    Ambulation/Gait                   Stairs             Wheelchair Mobility    Modified Rankin (Stroke Patients Only) Modified Rankin (Stroke Patients Only) Pre-Morbid Rankin Score: Moderate disability Modified Rankin: Severe disability     Balance Overall balance assessment: Needs assistance Sitting-balance support: Feet supported, Bilateral upper extremity supported Sitting balance-Leahy Scale: Poor Sitting balance - Comments: progressed from posterior lean to being able to sit with min guard assist, tendency to lean R, when brought to his attention he was able to return to midline Postural control: Posterior lean Standing balance support: Bilateral upper extremity supported Standing balance-Leahy Scale: Zero Standing balance comment: unable to achieve fully upright                            Cognition Arousal/Alertness: Lethargic, Suspect due to medications Behavior During  Therapy: Flat affect (smiling by end of session) Overall Cognitive Status: Impaired/Different from baseline Area of Impairment: Following commands, Safety/judgement, Attention, Problem solving                   Current Attention Level: Focused   Following Commands: Follows one step commands with increased time, Follows one step commands  inconsistently Safety/Judgement: Decreased awareness of safety, Decreased awareness of deficits   Problem Solving: Slow processing, Requires verbal cues, Requires tactile cues, Decreased initiation, Difficulty sequencing General Comments: pt received medication to help him rest last night        Exercises      General Comments        Pertinent Vitals/Pain Pain Assessment Pain Assessment: Faces Faces Pain Scale: No hurt    Home Living                          Prior Function            PT Goals (current goals can now be found in the care plan section) Acute Rehab PT Goals Patient Stated Goal: to go to rehab Time For Goal Achievement: 08/06/21 Potential to Achieve Goals: Good Progress towards PT goals: Not progressing toward goals - comment (?lethargic due to meds)    Frequency    Min 3X/week      PT Plan Discharge plan needs to be updated;Frequency needs to be updated    Co-evaluation PT/OT/SLP Co-Evaluation/Treatment: Yes Reason for Co-Treatment: For patient/therapist safety;To address functional/ADL transfers PT goals addressed during session: Mobility/safety with mobility;Balance;Proper use of DME OT goals addressed during session: ADL's and self-care;Strengthening/ROM      AM-PAC PT "6 Clicks" Mobility   Outcome Measure  Help needed turning from your back to your side while in a flat bed without using bedrails?: A Lot Help needed moving from lying on your back to sitting on the side of a flat bed without using bedrails?: Total Help needed moving to and from a bed to a chair (including a wheelchair)?: Total Help needed standing up from a chair using your arms (e.g., wheelchair or bedside chair)?: Total Help needed to walk in hospital room?: Total Help needed climbing 3-5 steps with a railing? : Total 6 Click Score: 7    End of Session Equipment Utilized During Treatment: Gait belt Activity Tolerance: Patient limited by fatigue Patient left:  with call bell/phone within reach;with family/visitor present;in bed;with bed alarm set Nurse Communication: Mobility status PT Visit Diagnosis: Unsteadiness on feet (R26.81);Muscle weakness (generalized) (M62.81);Hemiplegia and hemiparesis Hemiplegia - Right/Left: Right Hemiplegia - dominant/non-dominant: Dominant Hemiplegia - caused by: Cerebral infarction     Time: 9629-5284 PT Time Calculation (min) (ACUTE ONLY): 30 min  Charges:  $Therapeutic Activity: 8-22 mins                      Arby Barrette, PT Acute Rehabilitation Services  Pager [phone] Office [phone]    Rexanne Mano 07/29/2021, 10:03 AM

## 2021-08-26 ENCOUNTER — Encounter: Payer: Medicare Other | Admitting: Physician Assistant

## 2021-09-03 ENCOUNTER — Inpatient Hospital Stay: Payer: Medicare Other | Admitting: Neurology

## 2021-09-05 DEATH — deceased

## 2021-12-18 ENCOUNTER — Ambulatory Visit: Payer: Medicare Other | Admitting: Oncology

## 2023-01-09 IMAGING — PT NM PET TUM IMG INITIAL (PI) WHOLE BODY
1 series · 2 of 2 positions shown · non-contrast
Comparison: CT of the neck from January 20, 2021.

CLINICAL DATA: Initial treatment strategy for skin cancer, assess
treatment response. Scalp neoplasm post LEFT parotidectomy.

EXAM:
NUCLEAR MEDICINE PET WHOLE BODY
TECHNIQUE: 7.94 mCi F-18 FDG was injected intravenously. Full-ring PET imaging
was performed from the head to foot after the radiotracer. CT data
was obtained and used for attenuation correction and anatomic
localization.
Fasting blood glucose: 112 mg/dl

[Series 1056: results mm oncology reading · 1.0mm · 0.45mm/px · 2 of 2 slices shown]
[im 1/2]
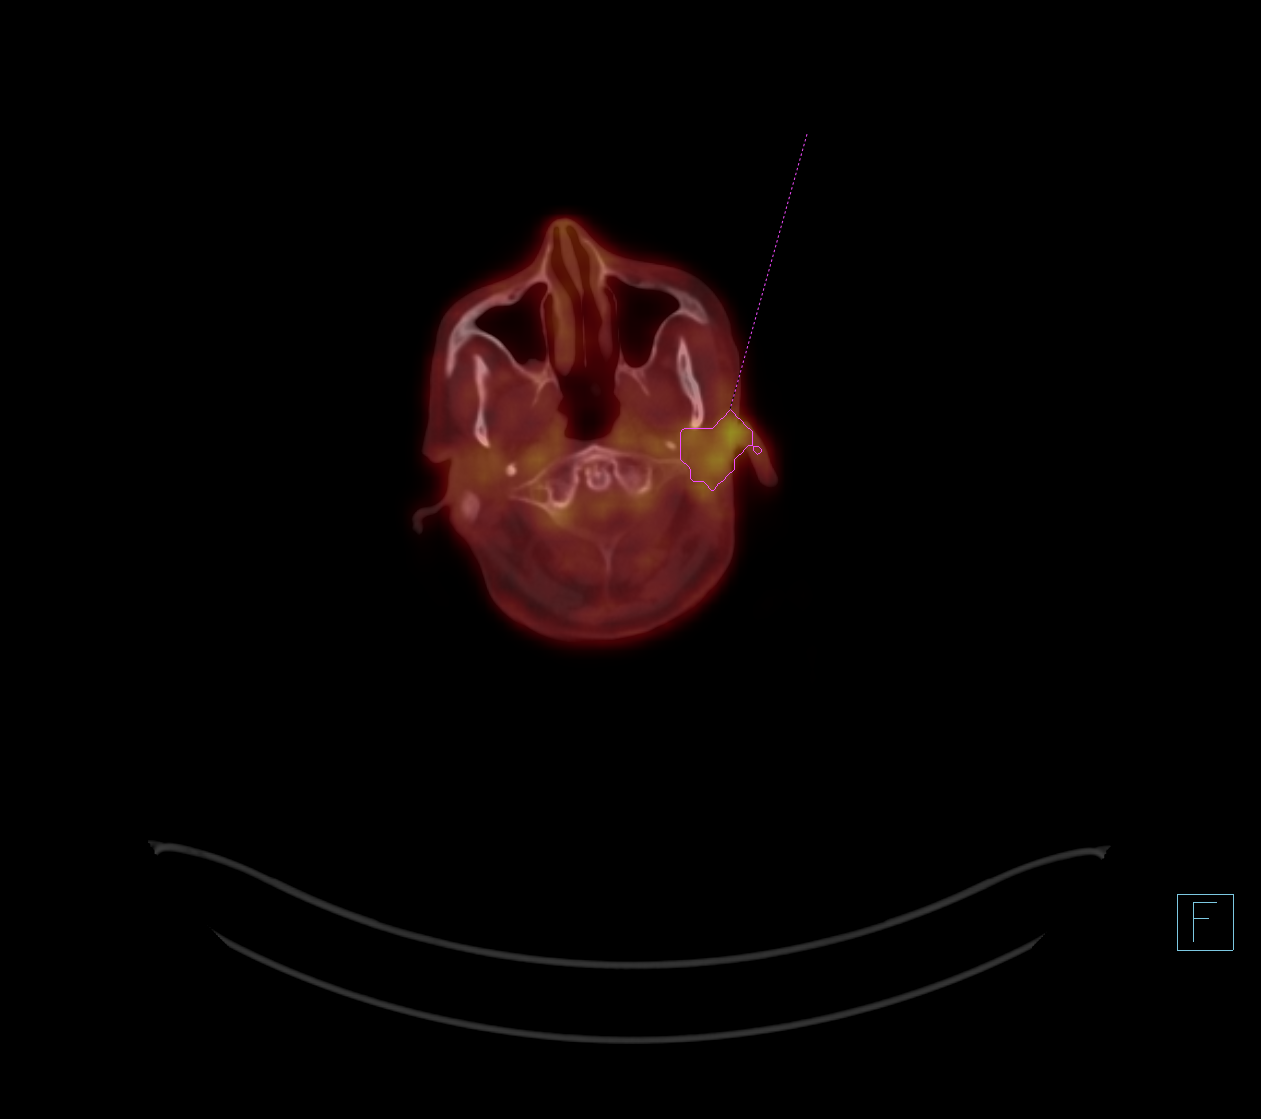
[im 2/2]
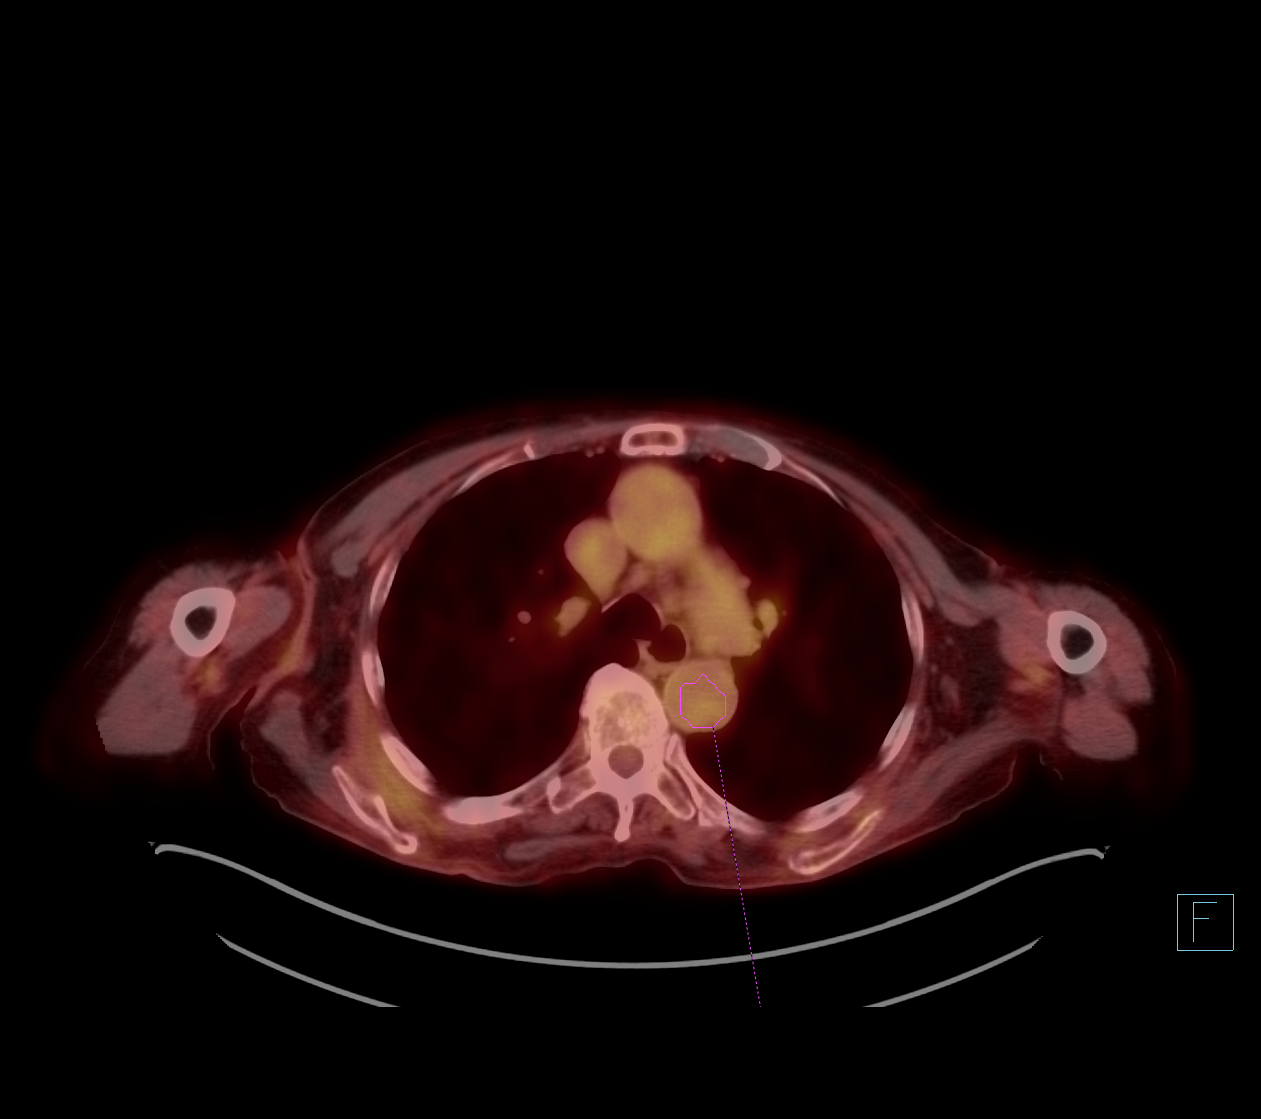

[2 of 2 positions shown; findings below may reference images not displayed]

FINDINGS: Mediastinal blood pool activity: SUV max

HEAD/NECK: No hypermetabolic activity in the scalp. No
hypermetabolic cervical lymph nodes.

Incidental CT findings: Postoperative changes about the LEFT parotid
bed. Maximum SUV in this location is 4.24 with blurring of
associated soft tissue planes. No contralateral adenopathy or areas
of increased metabolic activity outside of the operative region to
suggest nodal involvement elsewhere in the neck.

CHEST: No hypermetabolic mediastinal or hilar nodes. No suspicious
pulmonary nodules on the CT scan.

Incidental CT findings: Basilar atelectasis. No effusion. No
consolidative changes. Airways are patent. Calcified atheromatous
plaque of the thoracic aorta. Heart size mildly to moderately
enlarged. Calcified coronary artery disease and valvular
calcifications of the aortic valve. Normal caliber of central
pulmonary vessels. Limited assessment of cardiovascular structures
given lack of intravenous contrast.

ABDOMEN/PELVIS: No abnormal hypermetabolic activity within the
liver, pancreas, adrenal glands, or spleen. No hypermetabolic lymph
nodes in the abdomen or pelvis.

Incidental CT findings: No acute findings relative liver,
gallbladder, pancreas, spleen, adrenal glands or kidneys. The small
cyst in the lower pole the LEFT kidney. Urinary bladder
decompressed. No acute gastrointestinal findings. Aortic
atherosclerosis without aneurysm. No adenopathy by size criteria in
the abdomen or in the pelvis.

SKELETON: No focal hypermetabolic activity to suggest skeletal
metastasis.

Incidental CT findings: Spinal degenerative changes. No acute or
destructive bone process.

EXTREMITIES: No abnormal hypermetabolic activity in the lower
extremities.

Incidental CT findings: none
IMPRESSION: Postoperative changes about the LEFT neck related to LEFT parotid
resection resection of overlying skin lesion. Increased metabolic
activity in this area more likely related to postoperative change.
No signs of metastases in the neck, chest, abdomen or in the pelvis.

Aortic Atherosclerosis ([id number]-Y28.8).
